# Patient Record
Sex: Female | Born: 1985
Health system: Southern US, Community
[De-identification: ages and names within clinical notes are randomized; demographics above are authoritative.]

## PROBLEM LIST (undated history)

## (undated) ENCOUNTER — Inpatient Hospital Stay (HOSPITAL_COMMUNITY): Payer: Self-pay

## (undated) ENCOUNTER — Ambulatory Visit
Admission: EM | Disposition: A | Discharge status: Home / Self Care | Payer: No Typology Code available for payment source

## (undated) DIAGNOSIS — L930 Discoid lupus erythematosus: Secondary | ICD-10-CM

## (undated) DIAGNOSIS — Z3490 Encounter for supervision of normal pregnancy, unspecified, unspecified trimester: Secondary | ICD-10-CM

## (undated) DIAGNOSIS — B009 Herpesviral infection, unspecified: Secondary | ICD-10-CM

## (undated) DIAGNOSIS — R87619 Unspecified abnormal cytological findings in specimens from cervix uteri: Secondary | ICD-10-CM

## (undated) DIAGNOSIS — E538 Deficiency of other specified B group vitamins: Secondary | ICD-10-CM

## (undated) DIAGNOSIS — Z8619 Personal history of other infectious and parasitic diseases: Secondary | ICD-10-CM

## (undated) HISTORY — DX: Herpesviral infection, unspecified: B00.9

## (undated) HISTORY — DX: Encounter for supervision of normal pregnancy, unspecified, unspecified trimester: Z34.90

## (undated) HISTORY — PX: WISDOM TOOTH EXTRACTION: SHX21

## (undated) HISTORY — DX: Unspecified abnormal cytological findings in specimens from cervix uteri: R87.619

## (undated) HISTORY — DX: Deficiency of other specified B group vitamins: E53.8

## (undated) HISTORY — DX: Personal history of other infectious and parasitic diseases: Z86.19

## (undated) HISTORY — PX: COLPOSCOPY: SHX161

## (undated) HISTORY — DX: Discoid lupus erythematosus: L93.0

---

## 2004-01-14 ENCOUNTER — Emergency Department (HOSPITAL_COMMUNITY): Admission: EM | Admit: 2004-01-14 | Discharge: 2004-01-15 | Payer: Self-pay | Admitting: *Deleted

## 2004-07-05 ENCOUNTER — Emergency Department (HOSPITAL_COMMUNITY): Admission: EM | Admit: 2004-07-05 | Discharge: 2004-07-05 | Payer: Self-pay | Admitting: Emergency Medicine

## 2004-07-21 ENCOUNTER — Emergency Department (HOSPITAL_COMMUNITY): Admission: EM | Admit: 2004-07-21 | Discharge: 2004-07-21 | Payer: Self-pay | Admitting: Emergency Medicine

## 2004-09-28 ENCOUNTER — Emergency Department (HOSPITAL_COMMUNITY): Admission: EM | Admit: 2004-09-28 | Discharge: 2004-09-28 | Payer: Self-pay | Admitting: Emergency Medicine

## 2004-10-15 ENCOUNTER — Emergency Department (HOSPITAL_COMMUNITY): Admission: EM | Admit: 2004-10-15 | Discharge: 2004-10-15 | Payer: Self-pay | Admitting: Emergency Medicine

## 2004-11-01 ENCOUNTER — Ambulatory Visit (HOSPITAL_COMMUNITY): Admission: AD | Admit: 2004-11-01 | Discharge: 2004-11-01 | Payer: Self-pay | Admitting: Obstetrics and Gynecology

## 2004-11-22 ENCOUNTER — Observation Stay (HOSPITAL_COMMUNITY): Admission: AD | Admit: 2004-11-22 | Discharge: 2004-11-22 | Payer: Self-pay | Admitting: Obstetrics and Gynecology

## 2005-01-08 ENCOUNTER — Inpatient Hospital Stay (HOSPITAL_COMMUNITY): Admission: AD | Admit: 2005-01-08 | Discharge: 2005-01-11 | Payer: Self-pay | Admitting: Obstetrics and Gynecology

## 2005-03-25 ENCOUNTER — Emergency Department (HOSPITAL_COMMUNITY): Admission: EM | Admit: 2005-03-25 | Discharge: 2005-03-25 | Payer: Self-pay | Admitting: Emergency Medicine

## 2007-05-26 ENCOUNTER — Other Ambulatory Visit: Admission: RE | Admit: 2007-05-26 | Discharge: 2007-05-26 | Payer: Self-pay | Admitting: Obstetrics and Gynecology

## 2007-09-09 ENCOUNTER — Ambulatory Visit (HOSPITAL_COMMUNITY): Admission: RE | Admit: 2007-09-09 | Discharge: 2007-09-09 | Payer: Self-pay | Admitting: Family Medicine

## 2007-11-23 ENCOUNTER — Other Ambulatory Visit: Admission: RE | Admit: 2007-11-23 | Discharge: 2007-11-23 | Payer: Self-pay | Admitting: Obstetrics and Gynecology

## 2008-06-14 ENCOUNTER — Other Ambulatory Visit: Admission: RE | Admit: 2008-06-14 | Discharge: 2008-06-14 | Payer: Self-pay | Admitting: Obstetrics and Gynecology

## 2009-07-13 ENCOUNTER — Other Ambulatory Visit: Admission: RE | Admit: 2009-07-13 | Discharge: 2009-07-13 | Payer: Self-pay | Admitting: Obstetrics and Gynecology

## 2010-07-19 ENCOUNTER — Other Ambulatory Visit (HOSPITAL_COMMUNITY)
Admission: RE | Admit: 2010-07-19 | Discharge: 2010-07-19 | Disposition: A | Discharge status: Home / Self Care | Payer: Commercial Indemnity | Source: Ambulatory Visit | Attending: Obstetrics and Gynecology | Admitting: Obstetrics and Gynecology

## 2010-07-19 ENCOUNTER — Other Ambulatory Visit: Payer: Self-pay | Admitting: Adult Health

## 2010-07-19 DIAGNOSIS — Z01419 Encounter for gynecological examination (general) (routine) without abnormal findings: Secondary | ICD-10-CM | POA: Insufficient documentation

## 2010-07-19 DIAGNOSIS — Z113 Encounter for screening for infections with a predominantly sexual mode of transmission: Secondary | ICD-10-CM | POA: Insufficient documentation

## 2010-11-02 NOTE — H&P (Signed)
NAMECAYLOR, Stefanie [name] NO.:  [id number]   MEDICAL RECORD NO.:  [id number]          PATIENT TYPE:  INP   LOCATION:  LDR1                          FACILITY:  [facility]   PHYSICIAN:  Tilda Burrow, M.D. DATE OF BIRTH:  09/13/1985   DATE OF ADMISSION:  01/08/2005  DATE OF DISCHARGE:  LH                                HISTORY & PHYSICAL   REASON FOR ADMISSION:  Pregnancy at 22 and 3.  Induction at request of  patient due to maternal discomfort and fatigue and post dates.   MEDICAL HISTORY:  Negative.   SURGICAL HISTORY:  Negative.   ALLERGIES:  She has no known allergies.   MEDICATIONS:  Prenatal vitamins.   SOCIAL HISTORY:  She is single.  Her family is present and supportive.   PRENATAL COURSE:  Essentially uneventful.  She did have an abnormal Pap.  Blood type is A positive.  GBS is negative.  Rubella is immune.  Hepatitis B  surface antigen negative.  HIV is negative.  HPV is positive.  Serology is  nonreactive.  GC and Chlamydia initially were positive.  Tests of cure were  negative and repeated at 36 weeks and were negative.  AFP was abnormal, but  there normal high resolution ultrasound.  GBS was negative at 28 weeks.  At  28 weeks, hemoglobin was 12.4; at 28 weeks, hematocrit was 35.8.  One hour  glucose was 95.  _msafp____ screen was negative.   PHYSICAL EXAMINATION:  VITAL SIGNS:  Weight was 186, blood pressure 112/62,  fundal height 35 cm, fetal heart rate 140s, strong and regular.   PLAN:  We are going to admit for Foley bulb induction of labor.      DL/MEDQ  D:  16/03/9603  T:  01/08/2005  Job:  [id number]

## 2010-11-02 NOTE — Op Note (Signed)
NAMEMURRY, KHIEV NO.:  [id number]   MEDICAL RECORD NO.:  [id number]          PATIENT TYPE:  INP   LOCATION:  A402                          FACILITY:  [facility]   PHYSICIAN:  Tilda Burrow, M.D. DATE OF BIRTH:  Oct 30, 1985   DATE OF PROCEDURE:  01/09/2005  DATE OF DISCHARGE:                                 OPERATIVE REPORT   PROCEDURE:  Epidural catheter placement at 9 a.m.   Continuous lumbar epidural catheter placed at approximately 9 a.m. using  loss-of-resistance technique. The patient signed consent. Requested  epidural. Received fluid bolus. Had continuous lumbar epidural catheter  placed using loss-of-resistance technique with easy introduction of 5 cc of  1.5% lidocaine with epinephrine following by insertion of the epidural  catheter 3 cm into the epidural space, removal of the Tuohy needle and  taping the catheter to the back where upon a continuous infusion of 12 cc  per hour was administered with a 9 cc fluid bolus. The patient tolerated the  procedure well with good symmetric analgesic effect.       JVF/MEDQ  D:  01/11/2005  T:  01/11/2005  Job:  [id number]

## 2010-11-02 NOTE — Op Note (Signed)
NAMEREYES, FIFIELD NO.:  [id number]   MEDICAL RECORD NO.:  [id number]          PATIENT TYPE:  INP   LOCATION:  LDR1                          FACILITY:  [facility]   PHYSICIAN:  Tilda Burrow, M.D. DATE OF BIRTH:  12-04-1985   DATE OF PROCEDURE:  DATE OF DISCHARGE:                                 OPERATIVE REPORT   ONSET OF LABOR:  01/09/2005 at 8 a.m.   LENGTH OF FIRST STAGE LABOR:  2 hours and 14 minutes.   LENGTH OF SECOND STAGE LABOR:  17 minutes.   LENGTH OF THIRD STAGE LABOR:  9 minutes.   DATE OF DELIVERY:  01/09/2006 at 10:36 a.m.   DELIVERY NOTE:  Kurt had a normal spontaneous vaginal delivery of a  viable female infant over an intact perineum.  Upon delivery of head,  shoulders spontaneously delivered.  Upon delivery, the infant had a strong  cry, good movement of all extremities and pinked up well.  The infant was  thoroughly suctioned and dried, cord clamped and cut by mother and passed  off to the nursery staff for newborn care.  Upon inspection, the perineum  was noted to be intact.  The first stage of labor was actively managed with  20 units of Pitocin, 1000 mL D5 LR at a rapid rate.  The placenta was  delivered spontaneously via Schultze mechanism.  Upon inspection, membranes  are noted to be intact.  There was a three-vessel cord noted.  Estimated  blood loss approximately 300 mL.  Epidural catheter was removed with blue  tip intact.  The infant and mother both stabilized and transferred up to the  postpartum unit in stable condition.       DL/MEDQ  D:  16/03/9603  T:  01/09/2005  Job:  [id number]

## 2010-11-02 NOTE — Consult Note (Signed)
NAME:  Stefanie [name], Stefanie [name] NO.:  [id number]   MEDICAL RECORD NO.:  [id number]          PATIENT TYPE:  OIB   LOCATION:  LDR3                          FACILITY:  [facility]   PHYSICIAN:  Tilda Burrow, M.D. DATE OF BIRTH:  [birth year]   DATE OF CONSULTATION:  DATE OF DISCHARGE:                                   CONSULTATION   HISTORY OF PRESENT ILLNESS:  Stefanie [name] came in with complaints of a headache  and some right upper quadrant pain.  She has a reactive non-stress test.  Upon examination, her pain really does appear to be intercostal, however, I  will do labs to rule out organ origin.  She is getting a cold, feels like  her headache may be sinus.  Her blood pressure is 100/60.  Assuming her labs  are okay, I am going to discharge her home on some Zyrtec and some Lortab.       FC/MEDQ  D:  11/22/2004  T:  11/22/2004  Job:  [id number]   cc:   [facility]

## 2011-07-23 ENCOUNTER — Other Ambulatory Visit: Payer: Self-pay | Admitting: Adult Health

## 2011-07-23 ENCOUNTER — Other Ambulatory Visit (HOSPITAL_COMMUNITY)
Admission: RE | Admit: 2011-07-23 | Discharge: 2011-07-23 | Disposition: A | Discharge status: Home / Self Care | Payer: Commercial Indemnity | Source: Ambulatory Visit | Attending: Obstetrics and Gynecology | Admitting: Obstetrics and Gynecology

## 2011-07-23 DIAGNOSIS — Z01419 Encounter for gynecological examination (general) (routine) without abnormal findings: Secondary | ICD-10-CM | POA: Insufficient documentation

## 2011-07-23 DIAGNOSIS — Z113 Encounter for screening for infections with a predominantly sexual mode of transmission: Secondary | ICD-10-CM | POA: Insufficient documentation

## 2012-08-04 ENCOUNTER — Other Ambulatory Visit: Payer: Self-pay | Admitting: Adult Health

## 2012-08-04 ENCOUNTER — Other Ambulatory Visit (HOSPITAL_COMMUNITY)
Admission: RE | Admit: 2012-08-04 | Discharge: 2012-08-04 | Disposition: A | Discharge status: Home / Self Care | Payer: Commercial Indemnity | Source: Ambulatory Visit | Attending: Obstetrics and Gynecology | Admitting: Obstetrics and Gynecology

## 2012-08-04 DIAGNOSIS — Z01419 Encounter for gynecological examination (general) (routine) without abnormal findings: Secondary | ICD-10-CM | POA: Insufficient documentation

## 2012-08-04 DIAGNOSIS — Z113 Encounter for screening for infections with a predominantly sexual mode of transmission: Secondary | ICD-10-CM | POA: Insufficient documentation

## 2013-03-24 ENCOUNTER — Other Ambulatory Visit: Payer: Self-pay | Admitting: Adult Health

## 2013-04-29 ENCOUNTER — Other Ambulatory Visit: Payer: Self-pay | Admitting: Adult Health

## 2013-10-12 ENCOUNTER — Ambulatory Visit (INDEPENDENT_AMBULATORY_CARE_PROVIDER_SITE_OTHER): Payer: Managed Care, Other (non HMO) | Admitting: Adult Health

## 2013-10-12 ENCOUNTER — Encounter: Payer: Self-pay | Admitting: Adult Health

## 2013-10-12 VITALS — BP 112/70 | HR 72 | Ht 69.0 in | Wt 164.0 lb

## 2013-10-12 DIAGNOSIS — Z01419 Encounter for gynecological examination (general) (routine) without abnormal findings: Secondary | ICD-10-CM

## 2013-10-12 DIAGNOSIS — B009 Herpesviral infection, unspecified: Secondary | ICD-10-CM | POA: Insufficient documentation

## 2013-10-12 HISTORY — DX: Herpesviral infection, unspecified: B00.9

## 2013-10-12 MED ORDER — VALACYCLOVIR HCL 1 G PO TABS: ORAL_TABLET | ORAL | Status: DC

## 2013-10-12 NOTE — Progress Notes (Signed)
Patient ID: Stefanie [name], female   DOB: [birth year], 28 y.o.   MRN: [id number] History of Present Illness: Stefanie [name] is 28 year old white female, engaged, in for a physical.She had normal pap 08/04/12.She is getting married in June.   Current Medications, Allergies, Past Medical History, Past Surgical History, Family History and Social History were reviewed in [facility] electronic medical record.     Review of Systems: Patient denies any headaches, blurred vision, shortness of breath, chest pain, abdominal pain, problems with bowel movements, urination, or intercourse. No joint pain or mood swings, needs refill on valtrex.    Physical Exam:BP 112/70  Pulse 72  Ht 5\' 9"  (1.753 m)  Wt 164 lb (74.39 kg)  BMI 24.21 kg/m2  LMP 10/05/2013 General:  Well developed, well nourished, no acute distress Skin:  Warm and dry Neck:  Midline trachea, normal thyroid Lungs; Clear to auscultation bilaterally Breast:  No dominant palpable mass, retraction, or nipple discharge Cardiovascular: Regular rate and rhythm Abdomen:  Soft, non tender, no hepatosplenomegaly Pelvic:  External genitalia is normal in appearance.  The vagina is normal in appearance. The cervix is bulbous.  Uterus is felt to be normal size, shape, and contour.  No adnexal masses or tenderness noted. Extremities:  No swelling or varicosities noted Psych:  No mood changes, alert and cooperative, seems happy   Impression: Yearly gyn exam no pap Herpes   Plan: Refilled valtrex x 1 year Physical and pap in 1 year

## 2013-10-12 NOTE — Patient Instructions (Signed)
Physical  And pap in 1 year Call prn

## 2014-01-12 ENCOUNTER — Ambulatory Visit (INDEPENDENT_AMBULATORY_CARE_PROVIDER_SITE_OTHER): Payer: Managed Care, Other (non HMO) | Admitting: Obstetrics & Gynecology

## 2014-01-12 ENCOUNTER — Encounter: Payer: Self-pay | Admitting: Obstetrics & Gynecology

## 2014-01-12 VITALS — BP 110/80 | Ht 69.2 in | Wt 173.0 lb

## 2014-01-12 DIAGNOSIS — B373 Candidiasis of vulva and vagina: Secondary | ICD-10-CM

## 2014-01-12 DIAGNOSIS — B3731 Acute candidiasis of vulva and vagina: Secondary | ICD-10-CM | POA: Insufficient documentation

## 2014-01-12 MED ORDER — TERCONAZOLE 0.4 % VA CREA: 1.0000 | TOPICAL_CREAM | Freq: Every day | VAGINAL | Status: DC

## 2014-01-12 NOTE — Progress Notes (Signed)
Patient ID: Stefanie [name], female   DOB: 07/25/1985, 28 y.o.   MRN: [id number] Started having discharge yesterday, itching severely  Exam Severe yeast infection  Treated with GV  rx  Terazol

## 2014-01-16 ENCOUNTER — Emergency Department (HOSPITAL_COMMUNITY)
Admission: EM | Admit: 2014-01-16 | Discharge: 2014-01-16 | Disposition: A | Discharge status: Home / Self Care | Payer: Commercial Indemnity | Attending: Emergency Medicine | Admitting: Emergency Medicine

## 2014-01-16 ENCOUNTER — Emergency Department (HOSPITAL_COMMUNITY): Payer: Commercial Indemnity

## 2014-01-16 ENCOUNTER — Encounter (HOSPITAL_COMMUNITY): Payer: Self-pay | Admitting: Emergency Medicine

## 2014-01-16 DIAGNOSIS — S99929A Unspecified injury of unspecified foot, initial encounter: Secondary | ICD-10-CM

## 2014-01-16 DIAGNOSIS — Z8619 Personal history of other infectious and parasitic diseases: Secondary | ICD-10-CM | POA: Insufficient documentation

## 2014-01-16 DIAGNOSIS — S8990XA Unspecified injury of unspecified lower leg, initial encounter: Secondary | ICD-10-CM | POA: Insufficient documentation

## 2014-01-16 DIAGNOSIS — S99919A Unspecified injury of unspecified ankle, initial encounter: Secondary | ICD-10-CM

## 2014-01-16 DIAGNOSIS — S92919A Unspecified fracture of unspecified toe(s), initial encounter for closed fracture: Secondary | ICD-10-CM | POA: Insufficient documentation

## 2014-01-16 DIAGNOSIS — IMO0002 Reserved for concepts with insufficient information to code with codable children: Secondary | ICD-10-CM | POA: Insufficient documentation

## 2014-01-16 DIAGNOSIS — Y9389 Activity, other specified: Secondary | ICD-10-CM | POA: Insufficient documentation

## 2014-01-16 DIAGNOSIS — S92912A Unspecified fracture of left toe(s), initial encounter for closed fracture: Secondary | ICD-10-CM

## 2014-01-16 DIAGNOSIS — F17200 Nicotine dependence, unspecified, uncomplicated: Secondary | ICD-10-CM | POA: Insufficient documentation

## 2014-01-16 DIAGNOSIS — Y929 Unspecified place or not applicable: Secondary | ICD-10-CM | POA: Insufficient documentation

## 2014-01-16 MED ORDER — TRAMADOL HCL 50 MG PO TABS: 50.0000 mg | ORAL_TABLET | Freq: Four times a day (QID) | ORAL | Status: DC | PRN

## 2014-01-16 NOTE — ED Provider Notes (Signed)
CSN: [id number]     Arrival date & time 01/16/14  0950 History   First MD Initiated Contact with Patient 01/16/14 1017     Chief Complaint  Patient presents with  . Foot Pain   Stefanie [name] is a 28 y.o. female who presents to the Emergency Department complaining of pain to her left fourth and fifth toes that began suddenly after a direct blow to her foot this morning.  She states she struck her toes on the edge of a door.  She has not tried any therapies or medications at home prior to ED arrival.  She denies radiating pain, open wounds to the foot, numbness or ankle pain.   (Consider location/radiation/quality/duration/timing/severity/associated sxs/prior Treatment) Patient is a 28 y.o. female presenting with lower extremity pain. The history is provided by the patient.  Foot Pain This is a new problem. The current episode started today. The problem occurs constantly. The problem has been unchanged. Associated symptoms include arthralgias. Pertinent negatives include no chills, fever, joint swelling, nausea, numbness, rash or weakness. The symptoms are aggravated by bending (weight bearing). She has tried nothing for the symptoms. The treatment provided no relief.    Past Medical History  Diagnosis Date  . Abnormal Pap smear of cervix   . HSV-2 (herpes simplex virus 2) infection   . Hx of gonorrhea   . Herpes 10/12/2013   Past Surgical History  Procedure Laterality Date  . Colposcopy      colpo with biopsy   Family History  Problem Relation Age of Onset  . Heart attack Father   . Heart disease Father   . Diabetes Maternal Uncle   . Dementia Maternal Grandmother   . Cancer Maternal Grandfather     throat, lung, bladder  . Diabetes Paternal Grandmother   . Heart attack Paternal Grandfather   . Heart disease Paternal Grandfather    History  Substance Use Topics  . Smoking status: Current Every Day Smoker -- 0.25 packs/day for 10 years    Types: Cigarettes  . Smokeless  tobacco: Never Used  . Alcohol Use: No   OB History   Grav Para Term Preterm Abortions TAB SAB Ect Mult Living   2 1   1  1   1      Review of Systems  Constitutional: Negative for fever and chills.  Gastrointestinal: Negative for nausea.  Genitourinary: Negative for dysuria and difficulty urinating.  Musculoskeletal: Positive for arthralgias. Negative for joint swelling.       Left foot pain  Skin: Negative for color change, rash and wound.  Neurological: Negative for weakness and numbness.  All other systems reviewed and are negative.     Allergies  Review of patient's allergies indicates no known allergies.  Home Medications   Prior to Admission medications   Medication Sig Start Date End Date Taking? Authorizing Provider  Probiotic Product (PROBIOTIC DAILY PO) Take 1 tablet by mouth daily.    Historical Provider, MD  terconazole (TERAZOL 7) 0.4 % vaginal cream Place 1 applicator vaginally at bedtime. 01/12/14   Lazaro Arms, MD  valACYclovir (VALTREX) 1000 MG tablet TAKE ONE TABLET BY MOUTH EVERY DAY 10/12/13   Adline Potter, NP   BP 107/74  Pulse 76  Temp(Src) 98.2 F (36.8 C) (Oral)  Resp 16  Ht 5\' 9"  (1.753 m)  Wt 170 lb 6 oz (77.282 kg)  BMI 25.15 kg/m2  SpO2 98%  LMP 12/22/2013 Physical Exam  Nursing note and vitals reviewed.  Constitutional: She is oriented to person, place, and time. She appears well-developed and well-nourished. No distress.  HENT:  Head: Normocephalic and atraumatic.  Cardiovascular: Normal rate, regular rhythm, normal heart sounds and intact distal pulses.   No murmur heard. Pulmonary/Chest: Effort normal and breath sounds normal. No respiratory distress.  Musculoskeletal: She exhibits tenderness. She exhibits no edema.  Localized ttp of the proximal fourth and fifth toes of the left foot.  Mild bruising present at the fifth toe.  No bony deformity or edema.  No tenderness proximally.   Neurological: She is alert and oriented to  person, place, and time. She exhibits normal muscle tone. Coordination normal.  Skin: Skin is warm and dry.    ED Course  Procedures (including critical care time) Labs Review Labs Reviewed - No data to display  Imaging Review Dg Foot Complete Left  01/16/2014   CLINICAL DATA:  Lateral foot pain  EXAM: LEFT FOOT - COMPLETE 3+ VIEW  COMPARISON:  None.  FINDINGS: Acute obliquely oriented fracture through the proximal fifth phalanx. Associated surrounding soft tissue swelling. The remainder the bones and joints are unremarkable.  IMPRESSION: Acute minimally displaced fracture through the fifth proximal phalanx.   Electronically Signed   By: Malachy MoanHeath  McCullough M.D.   On: 01/16/2014 11:36     EKG Interpretation None      MDM   Final diagnoses:  Fracture, toe, left, closed, initial encounter    No open fx, no bony deformity.  Pain improved after buddy tape and post op shoe.  Remains NV intact.  Referral for ortho given.  rx for ultram.  Pt stable for d/c    Sevan Mcbroom L. Trisha Mangleriplett, PA-C 01/17/14 2114

## 2014-01-16 NOTE — ED Notes (Addendum)
Pt hit left pinkie toe and left 4th toe against door jam this am ,

## 2014-01-16 NOTE — Discharge Instructions (Signed)
Buddy Taping of Toes °We have taped your toes together to keep them from moving. This is called "buddy taping" since we used a part of your own body to keep the injured part still. We placed soft padding between your toes to keep them from rubbing against each other. Buddy taping will help with healing and to reduce pain. Keep your toes buddy taped together for as long as directed by your caregiver. °HOME CARE INSTRUCTIONS  °· Raise your injured area above the level of your heart while sitting or lying down. Prop it up with pillows. °· An ice pack used every twenty minutes, while awake, for the first one to two days may be helpful. Put ice in a plastic bag and put a towel between the bag and your skin. °· Watch for signs that the taping is too tight. These signs may be: °¨ Numbness of your taped toes. °¨ Coolness of your taped toes. °¨ Color change in the area beyond the tape. °¨ Increased pain. °· If you have any of these signs, loosen or rewrap the tape. If you need to loosen or rewrap the buddy tape, make sure you use the padding again. °SEEK IMMEDIATE MEDICAL CARE IF:  °· You have worse pain, swelling, inflammation (soreness), drainage or bleeding after you rewrap the tape. °· Any new problems occur. °MAKE SURE YOU:  °· Understand these instructions. °· Will watch your condition. °· Will get help right away if you are not doing well or get worse. °Document Released: 03/07/2004 Document Revised: 08/26/2011 Document Reviewed: 05/31/2008 °ExitCare® Patient Information ©2015 [employer]. This information is not intended to replace advice given to you by your health care provider. Make sure you discuss any questions you have with your health care provider. ° °

## 2014-01-17 ENCOUNTER — Telehealth: Payer: Self-pay | Admitting: *Deleted

## 2014-01-17 NOTE — Telephone Encounter (Signed)
Pt states saw Dr. Despina HiddenEure last Wednesday, using cream Terazol he prescribed now having a yellowish discharge but itching has improved.  Pt states thinks she might have an infection. Please advise.

## 2014-01-17 NOTE — ED Provider Notes (Signed)
Medical screening examination/treatment/procedure(s) were performed by non-physician practitioner and as supervising physician I was immediately available for consultation/collaboration.   EKG Interpretation None        Dally Oshel M Denzell Colasanti, DO 01/17/14 2155 

## 2014-01-18 NOTE — Telephone Encounter (Signed)
Pt informed to continue current therapy. Pt verbalized understanding.

## 2014-01-18 NOTE — Telephone Encounter (Signed)
Just tell her to stick to the plan, does not have BV at the same time, not possible from a pH standpoint

## 2014-01-20 ENCOUNTER — Ambulatory Visit (INDEPENDENT_AMBULATORY_CARE_PROVIDER_SITE_OTHER): Payer: Managed Care, Other (non HMO) | Admitting: Adult Health

## 2014-01-20 DIAGNOSIS — Z3200 Encounter for pregnancy test, result unknown: Secondary | ICD-10-CM

## 2014-01-20 DIAGNOSIS — Z3201 Encounter for pregnancy test, result positive: Secondary | ICD-10-CM

## 2014-01-20 NOTE — Progress Notes (Signed)
Patient ID: Stefanie [name], female   DOB: [birth year], 28 y.o.   MRN: [id number] Pt here today for UPT, UPT is positive. Pt states her LMP was 12/22/13. Pt states that she felt like she had a fever this morning but it was 98.5 here today. Pt was advised to take 2 flinstones a day and call us back if there are any changes. Pt was given verification of pregnancy.

## 2014-02-04 ENCOUNTER — Encounter: Payer: Self-pay | Admitting: Obstetrics and Gynecology

## 2014-02-04 ENCOUNTER — Ambulatory Visit (INDEPENDENT_AMBULATORY_CARE_PROVIDER_SITE_OTHER): Payer: Managed Care, Other (non HMO) | Admitting: Obstetrics and Gynecology

## 2014-02-04 VITALS — BP 120/80 | Ht 69.0 in | Wt 173.0 lb

## 2014-02-04 DIAGNOSIS — N9089 Other specified noninflammatory disorders of vulva and perineum: Secondary | ICD-10-CM

## 2014-02-04 DIAGNOSIS — Z331 Pregnant state, incidental: Secondary | ICD-10-CM

## 2014-02-04 MED ORDER — NYSTATIN-TRIAMCINOLONE 100000-0.1 UNIT/GM-% EX OINT: 1.0000 "application " | TOPICAL_OINTMENT | Freq: Two times a day (BID) | CUTANEOUS | Status: DC

## 2014-02-04 NOTE — Patient Instructions (Signed)
Dry vulvar regime reviewed.

## 2014-02-04 NOTE — Progress Notes (Signed)
   [facility] Visit  Patient name: Stefanie [name] MRN [id number]  Date of birth: May 28, 1986  CC & HPI:  Stefanie [name] is a 28 y.o. female presenting today for vaginal irritation, vaginal discharge. She states that she is having pain on her right side and having stomach cramps. Pt denies any bleeding. Pt states that she also has some vaginal irritation and maybe some discharge. She states that she noticed all of this since yesterday. She states that she works 9 AM-7:30 PM everyday. She states that she was here on 7/29 for a yeast infection and Dr. Despina HiddenEure treated her for that. She states that she is [redacted] weeks pregnant. She states that her last pap smear was last year.   ROS:  +Nausea +Stomach cramping +Vaginal irritation +Vaginal discharge No other complaints.   Pertinent History Reviewed:   Reviewed: Significant for  Medical         Past Medical History  Diagnosis Date  . Abnormal Pap smear of cervix   . HSV-2 (herpes simplex virus 2) infection   . Hx of gonorrhea   . Herpes 10/12/2013                              Surgical Hx:    Past Surgical History  Procedure Laterality Date  . Colposcopy      colpo with biopsy   Medications: Reviewed & Updated - see associated section                      Current outpatient prescriptions:Prenatal Vit-Fe Fumarate-FA (MULTIVITAMIN-PRENATAL) 27-0.8 MG TABS tablet, Take 1 tablet by mouth daily at 12 noon., Disp: , Rfl: ;  Probiotic Product (PROBIOTIC DAILY PO), Take 1 tablet by mouth daily., Disp: , Rfl: ;  traMADol (ULTRAM) 50 MG tablet, Take 1 tablet (50 mg total) by mouth every 6 (six) hours as needed., Disp: 15 tablet, Rfl: 0 valACYclovir (VALTREX) 1000 MG tablet, Take 1,000 mg by mouth daily., Disp: , Rfl:    Social History: Reviewed -  reports that she has been smoking Cigarettes.  She has a 2.5 pack-year smoking history. She has never used smokeless tobacco.  Objective Findings:  Vitals: Blood pressure 120/80, height 5\' 9"   (1.753 m), weight 173 lb (78.472 kg), last menstrual period 12/22/2013.  Physical Examination: General appearance - alert, well appearing, and in no distress, oriented to person, place, and time and normal appearing weight Pelvic - normal external genitalia, slight erythema of labia majora bilaterally, vulva, vagina, cervix, uterus and adnexa, VULVA: normal appearing vulva with no masses, tenderness or lesions,  VAGINA: normal appearing vagina with normal color and discharge, no lesions, vaginal discharge - white and scant, WET MOUNT done - results: KOH done, clue cells, negative wet mount and negative for trichomoniasis, DNA probe for chlamydia and GC obtained,  CERVIX: normal appearing cervix without discharge or lesions,  UTERUS: uterus is normal size, shape, consistency and nontender,  ADNEXA: normal adnexa in size, nontender and no masses  Assessment & Plan:   A:  1. No visible yeast.  2. Persistent vulvar irritation status post yeast dx.    P:  1. Treat with a topical Mycolog      This chart was scribed by Chestine SporeSoijett Blue, Medical Scribe, for Dr. Christin BachJohn Ferguson on 02/04/14 at 11:38 AM. This chart was reviewed by Dr. Christin BachJohn Ferguson for accuracy.

## 2014-02-14 ENCOUNTER — Other Ambulatory Visit: Payer: Self-pay | Admitting: Obstetrics & Gynecology

## 2014-02-14 ENCOUNTER — Other Ambulatory Visit: Payer: Managed Care, Other (non HMO)

## 2014-02-14 DIAGNOSIS — O3680X0 Pregnancy with inconclusive fetal viability, not applicable or unspecified: Secondary | ICD-10-CM

## 2014-02-15 ENCOUNTER — Ambulatory Visit (INDEPENDENT_AMBULATORY_CARE_PROVIDER_SITE_OTHER): Payer: Managed Care, Other (non HMO)

## 2014-02-15 ENCOUNTER — Encounter: Payer: Managed Care, Other (non HMO) | Admitting: Women's Health

## 2014-02-15 DIAGNOSIS — O3680X0 Pregnancy with inconclusive fetal viability, not applicable or unspecified: Secondary | ICD-10-CM

## 2014-02-15 NOTE — Progress Notes (Signed)
U/S(7+6wks)-single IUP with +FCA noted, FHR-167 bpm, CRL c/w LMP dates, cx appears closed, bilateral adnexa appears WNL with C.L. Noted on LT = 2.9cm, no free fluid noted within the pelvis

## 2014-02-23 ENCOUNTER — Encounter: Payer: Self-pay | Admitting: Advanced Practice Midwife

## 2014-02-23 ENCOUNTER — Ambulatory Visit (INDEPENDENT_AMBULATORY_CARE_PROVIDER_SITE_OTHER): Payer: Managed Care, Other (non HMO) | Admitting: Advanced Practice Midwife

## 2014-02-23 VITALS — BP 102/62 | Wt 175.0 lb

## 2014-02-23 DIAGNOSIS — Z1389 Encounter for screening for other disorder: Secondary | ICD-10-CM

## 2014-02-23 DIAGNOSIS — Z3480 Encounter for supervision of other normal pregnancy, unspecified trimester: Secondary | ICD-10-CM

## 2014-02-23 DIAGNOSIS — Z3490 Encounter for supervision of normal pregnancy, unspecified, unspecified trimester: Secondary | ICD-10-CM

## 2014-02-23 DIAGNOSIS — Z0184 Encounter for antibody response examination: Secondary | ICD-10-CM

## 2014-02-23 DIAGNOSIS — Z3481 Encounter for supervision of other normal pregnancy, first trimester: Secondary | ICD-10-CM

## 2014-02-23 DIAGNOSIS — Z0189 Encounter for other specified special examinations: Secondary | ICD-10-CM

## 2014-02-23 DIAGNOSIS — Z1159 Encounter for screening for other viral diseases: Secondary | ICD-10-CM

## 2014-02-23 DIAGNOSIS — Z130 Encounter for screening for diseases of the blood and blood-forming organs and certain disorders involving the immune mechanism: Secondary | ICD-10-CM

## 2014-02-23 DIAGNOSIS — Z331 Pregnant state, incidental: Secondary | ICD-10-CM

## 2014-02-23 LAB — POCT URINALYSIS DIPSTICK
Blood, UA: NEGATIVE
Glucose, UA: NEGATIVE
KETONES UA: NEGATIVE
Leukocytes, UA: NEGATIVE
Nitrite, UA: NEGATIVE
Protein, UA: NEGATIVE

## 2014-02-23 LAB — CBC
HCT: 39 % (ref 36.0–46.0)
Hemoglobin: 13.6 g/dL (ref 12.0–15.0)
MCH: 32.9 pg (ref 26.0–34.0)
MCHC: 34.9 g/dL (ref 30.0–36.0)
MCV: 94.4 fL (ref 78.0–100.0)
Platelets: 214 10*3/uL (ref 150–400)
RBC: 4.13 MIL/uL (ref 3.87–5.11)
RDW: 12.6 % (ref 11.5–15.5)
WBC: 5.1 10*3/uL (ref 4.0–10.5)

## 2014-02-23 MED ORDER — DOXYLAMINE-PYRIDOXINE 10-10 MG PO TBEC: DELAYED_RELEASE_TABLET | ORAL | Status: DC

## 2014-02-23 NOTE — Patient Instructions (Signed)
Ask insurance company about : Nuchal translucency/integrated screening (2 part test) versus Harmony (cell free DNA)

## 2014-02-23 NOTE — Progress Notes (Signed)
  Subjective:    Stefanie [name] is a [id number] [redacted]w[redacted]d being seen today for her first obstetrical visit.  Her obstetrical history is significant for SVD without problems 9 years ago..  Pregnancy history fully reviewed.  Patient reports fatigue and nausea.  Filed Vitals:   02/23/14 0913  BP: 102/62  Weight: 175 lb (79.379 kg)    HISTORY: OB History  Gravida Para Term Preterm AB SAB TAB Ectopic Multiple Living  # Outcome Date GA Lbr Len/2nd Weight Sex Delivery Anes PTL Lv  3 CUR           2 TRM 2006    F SVD   Y  1 SAB              Past Medical History  Diagnosis Date  . Abnormal Pap smear of cervix   . HSV-2 (herpes simplex virus 2) infection   . Hx of gonorrhea   . Herpes 10/12/2013   Past Surgical History  Procedure Laterality Date  . Colposcopy      colpo with biopsy   Family History  Problem Relation Age of Onset  . Heart attack Father   . Heart disease Father   . Diabetes Maternal Uncle   . Dementia Maternal Grandmother   . Cancer Maternal Grandfather     throat, lung, bladder  . Diabetes Paternal Grandmother   . Heart attack Paternal Grandfather   . Heart disease Paternal Grandfather      Exam       Pelvic Exam:    Perineum: Normal Perineum   Vulva: normal                   Urinary:  urethral meatus normal    System:     Skin: normal coloration and turgor, no rashes    Neurologic: oriented, normal, normal mood   Extremities: normal strength, tone, and muscle mass   HEENT PERRLA   Mouth/Teeth mucous membranes moist, normal dentition   Neck supple and no masses   Cardiovascular: regular rate and rhythm   Respiratory:  appears well, vitals normal, no respiratory distress, acyanotic   Abdomen: soft, non-tender;  FHR: 140 [place]          Assessment:    Pregnancy: [id number] Patient Active Problem List   Diagnosis Date Noted  . Vulvar irritation 02/04/2014  . Candidiasis of vulva and vagina 01/12/2014  . Herpes 10/12/2013         Plan:     Initial labs drawn. Continue prenatal vitamins  Problem list reviewed and updated  Reviewed n/v relief measures and warning s/s to report .  Diclegis samples and rx sent to pharmacy wit coupon Reviewed recommended weight gain based on pre-gravid BMI  Encouraged well-balanced diet Genetic Screening discussed Integrated Screen: versus HARMONY (cost with insurance vs scared of a false + as with last baby) requested. Will check with insurance company to see if HARMONY may be better for her.  Right now, schedule for NT/IT  Ultrasound discussed; fetal survey: requested.  Follow up in 3 weeks for NT.IT  Low-risk ob appt .  [name],Stefanie [name] 02/23/2014

## 2014-02-24 ENCOUNTER — Encounter: Payer: Managed Care, Other (non HMO) | Admitting: Advanced Practice Midwife

## 2014-02-24 LAB — ANTIBODY SCREEN: Antibody Screen: NEGATIVE

## 2014-02-24 LAB — URINALYSIS
BILIRUBIN URINE: NEGATIVE
Glucose, UA: NEGATIVE mg/dL
Hgb urine dipstick: NEGATIVE
KETONES UR: NEGATIVE mg/dL
Leukocytes, UA: NEGATIVE
Nitrite: NEGATIVE
Protein, ur: NEGATIVE mg/dL
Specific Gravity, Urine: 1.02 (ref 1.005–1.030)
UROBILINOGEN UA: 0.2 mg/dL (ref 0.0–1.0)
pH: 7 (ref 5.0–8.0)

## 2014-02-24 LAB — ABO AND RH: Rh Type: POSITIVE

## 2014-02-24 LAB — HEPATITIS B SURFACE ANTIGEN: Hepatitis B Surface Ag: NEGATIVE

## 2014-02-24 LAB — DRUG SCREEN, URINE, NO CONFIRMATION
Amphetamine Screen, Ur: NEGATIVE
Barbiturate Quant, Ur: NEGATIVE
Benzodiazepines.: NEGATIVE
Cocaine Metabolites: NEGATIVE
Creatinine,U: 140.3 mg/dL
Marijuana Metabolite: NEGATIVE
Methadone: NEGATIVE
Opiate Screen, Urine: NEGATIVE
Phencyclidine (PCP): NEGATIVE
Propoxyphene: NEGATIVE

## 2014-02-24 LAB — GC/CHLAMYDIA PROBE AMP
CT PROBE, AMP APTIMA: NEGATIVE
GC Probe RNA: NEGATIVE

## 2014-02-24 LAB — VARICELLA ZOSTER ANTIBODY, IGG: Varicella IgG: 237.5 Index — ABNORMAL HIGH (ref <135.00)

## 2014-02-24 LAB — HIV ANTIBODY (ROUTINE TESTING W REFLEX): HIV 1&2 Ab, 4th Generation: NONREACTIVE

## 2014-02-24 LAB — RPR

## 2014-02-24 LAB — OXYCODONE SCREEN, UA, RFLX CONFIRM: Oxycodone Screen, Ur: NEGATIVE ng/mL

## 2014-02-24 LAB — SICKLE CELL SCREEN: Sickle Cell Screen: NEGATIVE

## 2014-02-24 LAB — RUBELLA SCREEN: Rubella: 0.86 Index (ref <0.90)

## 2014-02-25 LAB — URINE CULTURE
Colony Count: NO GROWTH
ORGANISM ID, BACTERIA: NO GROWTH

## 2014-03-14 ENCOUNTER — Other Ambulatory Visit: Payer: Self-pay | Admitting: Advanced Practice Midwife

## 2014-03-14 DIAGNOSIS — Z36 Encounter for antenatal screening of mother: Secondary | ICD-10-CM

## 2014-03-16 ENCOUNTER — Encounter: Payer: Managed Care, Other (non HMO) | Admitting: Women's Health

## 2014-03-16 ENCOUNTER — Other Ambulatory Visit: Payer: Managed Care, Other (non HMO)

## 2014-03-16 ENCOUNTER — Encounter: Payer: Self-pay | Admitting: Women's Health

## 2014-03-16 ENCOUNTER — Ambulatory Visit (INDEPENDENT_AMBULATORY_CARE_PROVIDER_SITE_OTHER): Payer: Managed Care, Other (non HMO) | Admitting: Women's Health

## 2014-03-16 ENCOUNTER — Ambulatory Visit (INDEPENDENT_AMBULATORY_CARE_PROVIDER_SITE_OTHER): Payer: Managed Care, Other (non HMO)

## 2014-03-16 VITALS — BP 114/70 | Wt 177.0 lb

## 2014-03-16 DIAGNOSIS — Z331 Pregnant state, incidental: Secondary | ICD-10-CM

## 2014-03-16 DIAGNOSIS — Z3480 Encounter for supervision of other normal pregnancy, unspecified trimester: Secondary | ICD-10-CM

## 2014-03-16 DIAGNOSIS — Z36 Encounter for antenatal screening of mother: Secondary | ICD-10-CM

## 2014-03-16 DIAGNOSIS — Z1389 Encounter for screening for other disorder: Secondary | ICD-10-CM

## 2014-03-16 DIAGNOSIS — Z3481 Encounter for supervision of other normal pregnancy, first trimester: Secondary | ICD-10-CM

## 2014-03-16 LAB — POCT URINALYSIS DIPSTICK
Blood, UA: NEGATIVE
Glucose, UA: NEGATIVE
Ketones, UA: NEGATIVE
Leukocytes, UA: NEGATIVE
Nitrite, UA: NEGATIVE
Protein, UA: NEGATIVE

## 2014-03-16 NOTE — Progress Notes (Signed)
Low-risk OB appointment [id number] Estimated Date of Delivery: 09/28/14 BP 114/70  Wt 177 lb (80.287 kg)  LMP 12/22/2013  BP, weight, and urine reviewed.  Refer to obstetrical flow sheet for FH & FHR.  No fm yet. Denies cramping, lof, vb, or uti s/s. No complaints. Diclegis really helping w/ nausea Reviewed today's normal NT u/s, warning s/s to report. Plan:  Continue routine obstetrical care  F/U in 4wks for OB appointment and 2nd IT 1st IT/NT today

## 2014-03-16 NOTE — Patient Instructions (Signed)
First Trimester of Pregnancy The first trimester of pregnancy is from week 1 until the end of week 12 (months 1 through 3). A week after a sperm fertilizes an egg, the egg will implant on the wall of the uterus. This embryo will begin to develop into a baby. Genes from you and your partner are forming the baby. The female genes determine whether the baby is a boy or a girl. At 6-8 weeks, the eyes and face are formed, and the heartbeat can be seen on ultrasound. At the end of 12 weeks, all the baby's organs are formed.  Now that you are pregnant, you will want to do everything you can to have a healthy baby. Two of the most important things are to get good prenatal care and to follow your health care provider's instructions. Prenatal care is all the medical care you receive before the baby's birth. This care will help prevent, find, and treat any problems during the pregnancy and childbirth. BODY CHANGES Your body goes through many changes during pregnancy. The changes vary from woman to woman.   You may gain or lose a couple of pounds at first.  You may feel sick to your stomach (nauseous) and throw up (vomit). If the vomiting is uncontrollable, call your health care provider.  You may tire easily.  You may develop headaches that can be relieved by medicines approved by your health care provider.  You may urinate more often. Painful urination may mean you have a bladder infection.  You may develop heartburn as a result of your pregnancy.  You may develop constipation because certain hormones are causing the muscles that push waste through your intestines to slow down.  You may develop hemorrhoids or swollen, bulging veins (varicose veins).  Your breasts may begin to grow larger and become tender. Your nipples may stick out more, and the tissue that surrounds them (areola) may become darker.  Your gums may bleed and may be sensitive to brushing and flossing.  Dark spots or blotches (chloasma,  mask of pregnancy) may develop on your face. This will likely fade after the baby is born.  Your menstrual periods will stop.  You may have a loss of appetite.  You may develop cravings for certain kinds of food.  You may have changes in your emotions from day to day, such as being excited to be pregnant or being concerned that something may go wrong with the pregnancy and baby.  You may have more vivid and strange dreams.  You may have changes in your hair. These can include thickening of your hair, rapid growth, and changes in texture. Some women also have hair loss during or after pregnancy, or hair that feels dry or thin. Your hair will most likely return to normal after your baby is born. WHAT TO EXPECT AT YOUR PRENATAL VISITS During a routine prenatal visit:  You will be weighed to make sure you and the baby are growing normally.  Your blood pressure will be taken.  Your abdomen will be measured to track your baby's growth.  The fetal heartbeat will be listened to starting around week 10 or 12 of your pregnancy.  Test results from any previous visits will be discussed. Your health care provider may ask you:  How you are feeling.  If you are feeling the baby move.  If you have had any abnormal symptoms, such as leaking fluid, bleeding, severe headaches, or abdominal cramping.  If you have any questions. Other tests   that may be performed during your first trimester include:  Blood tests to find your blood type and to check for the presence of any previous infections. They will also be used to check for low iron levels (anemia) and Rh antibodies. Later in the pregnancy, blood tests for diabetes will be done along with other tests if problems develop.  Urine tests to check for infections, diabetes, or protein in the urine.  An ultrasound to confirm the proper growth and development of the baby.  An amniocentesis to check for possible genetic problems.  Fetal screens for  spina bifida and Down syndrome.  You may need other tests to make sure you and the baby are doing well. HOME CARE INSTRUCTIONS  Medicines  Follow your health care provider's instructions regarding medicine use. Specific medicines may be either safe or unsafe to take during pregnancy.  Take your prenatal vitamins as directed.  If you develop constipation, try taking a stool softener if your health care provider approves. Diet  Eat regular, well-balanced meals. Choose a variety of foods, such as meat or vegetable-based protein, fish, milk and low-fat dairy products, vegetables, fruits, and whole grain breads and cereals. Your health care provider will help you determine the amount of weight gain that is right for you.  Avoid raw meat and uncooked cheese. These carry germs that can cause birth defects in the baby.  Eating four or five small meals rather than three large meals a day may help relieve nausea and vomiting. If you start to feel nauseous, eating a few soda crackers can be helpful. Drinking liquids between meals instead of during meals also seems to help nausea and vomiting.  If you develop constipation, eat more high-fiber foods, such as fresh vegetables or fruit and whole grains. Drink enough fluids to keep your urine clear or pale yellow. Activity and Exercise  Exercise only as directed by your health care provider. Exercising will help you:  Control your weight.  Stay in shape.  Be prepared for labor and delivery.  Experiencing pain or cramping in the lower abdomen or low back is a good sign that you should stop exercising. Check with your health care provider before continuing normal exercises.  Try to avoid standing for long periods of time. Move your legs often if you must stand in one place for a long time.  Avoid heavy lifting.  Wear low-heeled shoes, and practice good posture.  You may continue to have sex unless your health care provider directs you  otherwise. Relief of Pain or Discomfort  Wear a good support bra for breast tenderness.   Take warm sitz baths to soothe any pain or discomfort caused by hemorrhoids. Use hemorrhoid cream if your health care provider approves.   Rest with your legs elevated if you have leg cramps or low back pain.  If you develop varicose veins in your legs, wear support hose. Elevate your feet for 15 minutes, 3-4 times a day. Limit salt in your diet. Prenatal Care  Schedule your prenatal visits by the twelfth week of pregnancy. They are usually scheduled monthly at first, then more often in the last 2 months before delivery.  Write down your questions. Take them to your prenatal visits.  Keep all your prenatal visits as directed by your health care provider. Safety  Wear your seat belt at all times when driving.  Make a list of emergency phone numbers, including numbers for family, friends, the hospital, and police and fire departments. General Tips    Ask your health care provider for a referral to a local prenatal education class. Begin classes no later than at the beginning of month 6 of your pregnancy.  Ask for help if you have counseling or nutritional needs during pregnancy. Your health care provider can offer advice or refer you to specialists for help with various needs.  Do not use hot tubs, steam rooms, or saunas.  Do not douche or use tampons or scented sanitary pads.  Do not cross your legs for long periods of time.  Avoid cat litter boxes and soil used by cats. These carry germs that can cause birth defects in the baby and possibly loss of the fetus by miscarriage or stillbirth.  Avoid all smoking, herbs, alcohol, and medicines not prescribed by your health care provider. Chemicals in these affect the formation and growth of the baby.  Schedule a dentist appointment. At home, brush your teeth with a soft toothbrush and be gentle when you floss. SEEK MEDICAL CARE IF:   You have  dizziness.  You have mild pelvic cramps, pelvic pressure, or nagging pain in the abdominal area.  You have persistent nausea, vomiting, or diarrhea.  You have a bad smelling vaginal discharge.  You have pain with urination.  You notice increased swelling in your face, hands, legs, or ankles. SEEK IMMEDIATE MEDICAL CARE IF:   You have a fever.  You are leaking fluid from your vagina.  You have spotting or bleeding from your vagina.  You have severe abdominal cramping or pain.  You have rapid weight gain or loss.  You vomit blood or material that looks like coffee grounds.  You are exposed to German measles and have never had them.  You are exposed to fifth disease or chickenpox.  You develop a severe headache.  You have shortness of breath.  You have any kind of trauma, such as from a fall or a car accident. Document Released: 05/28/2001 Document Revised: 10/18/2013 Document Reviewed: 04/13/2013 [employer] Patient Information 2015 [employer]. This information is not intended to replace advice given to you by your health care provider. Make sure you discuss any questions you have with your health care provider.  

## 2014-03-16 NOTE — Progress Notes (Signed)
U/S(12+0wks)-single active fetus, CRL c/w dates, anterior Gr 0 placenta, cx appears closed, bilateral adnexa appears WNL, NB present, NT-1.6547mm, FHR-163 bpm

## 2014-03-22 ENCOUNTER — Other Ambulatory Visit: Payer: Managed Care, Other (non HMO)

## 2014-03-22 ENCOUNTER — Encounter: Payer: Managed Care, Other (non HMO) | Admitting: Women's Health

## 2014-03-22 LAB — MATERNAL SCREEN, INTEGRATED #1

## 2014-04-05 ENCOUNTER — Telehealth: Payer: Self-pay | Admitting: Women's Health

## 2014-04-05 ENCOUNTER — Other Ambulatory Visit: Payer: Self-pay | Admitting: *Deleted

## 2014-04-05 NOTE — Telephone Encounter (Signed)
Pt states she is starting to get a yeast infection and wanted to know what she could take over the counter for it.  I informed her that she needs to be seen to make sure that is what she has and then we could give her a RX for it.  Pt states she can not come in due to work schedule.  I spoke with Joellyn HaffKim Booker, CNM and she states that the pt can try OTC Monistat but do not do less than the 7 day treatment, if still has symptoms after that she would need an office visit.  Pt verbalized understanding.

## 2014-04-06 ENCOUNTER — Telehealth: Payer: Self-pay | Admitting: Women's Health

## 2014-04-06 MED ORDER — FLUCONAZOLE 150 MG PO TABS: 150.0000 mg | ORAL_TABLET | Freq: Once | ORAL | Status: DC

## 2014-04-06 NOTE — Telephone Encounter (Signed)
Pt informed that medication was sent to her pharmacy.

## 2014-04-06 NOTE — Telephone Encounter (Signed)
Pt called yesterday regarding a yeast infection.  Pt states that she used the OTC Monistat last night about 11:30pm and then woke up about 1am this morning with cramping.  She has not had any more cramping today and no bleeding.  Pt states she has had some itching and irritation X 2 days, slight white DC yesterday morning and she gets frequent yeast infections while pregnant, she is scheduled for an office visit next Thursday 04/14/14 with Drenda FreezeFran but worried that if she waits that long to get any treatment the yeast infection will get much worse.  She cant get off work any this week to come into the office.  Please advise.

## 2014-04-14 ENCOUNTER — Encounter: Payer: Self-pay | Admitting: Advanced Practice Midwife

## 2014-04-14 ENCOUNTER — Ambulatory Visit (INDEPENDENT_AMBULATORY_CARE_PROVIDER_SITE_OTHER): Payer: Managed Care, Other (non HMO) | Admitting: Advanced Practice Midwife

## 2014-04-14 VITALS — BP 110/80 | Wt 179.0 lb

## 2014-04-14 DIAGNOSIS — Z331 Pregnant state, incidental: Secondary | ICD-10-CM

## 2014-04-14 DIAGNOSIS — Z3682 Encounter for antenatal screening for nuchal translucency: Secondary | ICD-10-CM

## 2014-04-14 DIAGNOSIS — Z3492 Encounter for supervision of normal pregnancy, unspecified, second trimester: Secondary | ICD-10-CM

## 2014-04-14 DIAGNOSIS — Z3482 Encounter for supervision of other normal pregnancy, second trimester: Secondary | ICD-10-CM

## 2014-04-14 DIAGNOSIS — Z23 Encounter for immunization: Secondary | ICD-10-CM

## 2014-04-14 DIAGNOSIS — Z1389 Encounter for screening for other disorder: Secondary | ICD-10-CM

## 2014-04-14 DIAGNOSIS — Z36 Encounter for antenatal screening of mother: Secondary | ICD-10-CM

## 2014-04-14 LAB — POCT URINALYSIS DIPSTICK
Blood, UA: NEGATIVE
Glucose, UA: NEGATIVE
Ketones, UA: NEGATIVE
Leukocytes, UA: NEGATIVE
Nitrite, UA: NEGATIVE
Protein, UA: NEGATIVE

## 2014-04-14 NOTE — Progress Notes (Addendum)
[id number] Estimated Date of Delivery: 09/28/14  Blood pressure 110/80, weight 179 lb (81.194 kg), last menstrual period 12/22/2013.   BP weight and urine results all reviewed and noted.  Please refer to the obstetrical flow sheet for the fundal height and fetal heart rate documentation:  Patient reports good fetal movement, denies any bleeding and no rupture of membranes symptoms or regular contractions. Patient is without complaints. Nausea is better, but still doesn't have an appetite in the evening.  Recommended nutritional shake All questions were answered.  Plan:  Continued routine obstetrical care, 2nd IT  Follow up in 3 weeks for OB appointment, anatomy scan

## 2014-04-18 ENCOUNTER — Encounter: Payer: Self-pay | Admitting: Advanced Practice Midwife

## 2014-04-20 LAB — MATERNAL SCREEN, INTEGRATED #2
AFP MoM: 1.16
AFP, Serum: 36.2 ng/mL
Age risk Down Syndrome: 1:680 {titer}
Calculated Gestational Age: 16.6
Crown Rump Length: 58.7 mm
Estriol Mom: 0.77
Estriol, Free: 0.69 ng/mL
Inhibin A Dimeric: 263 pg/mL
Inhibin A MoM: 1.71
MSS Down Syndrome: <1:5000 {titer}
MSS Trisomy 18 Risk: <1:5000 {titer}
NT MoM: 1.08
Nuchal Translucency: 1.47 mm
Number of fetuses: 1
PAPP-A MoM: 0.89
PAPP-A: 445 ng/mL
Rish for ONTD: <1:5000 {titer}
hCG MoM: 1.06
hCG, Serum: 29.8 IU/mL

## 2014-05-05 ENCOUNTER — Ambulatory Visit (INDEPENDENT_AMBULATORY_CARE_PROVIDER_SITE_OTHER): Payer: Managed Care, Other (non HMO)

## 2014-05-05 ENCOUNTER — Ambulatory Visit (INDEPENDENT_AMBULATORY_CARE_PROVIDER_SITE_OTHER): Payer: Managed Care, Other (non HMO) | Admitting: Advanced Practice Midwife

## 2014-05-05 ENCOUNTER — Encounter: Payer: Self-pay | Admitting: Advanced Practice Midwife

## 2014-05-05 ENCOUNTER — Other Ambulatory Visit: Payer: Self-pay | Admitting: Advanced Practice Midwife

## 2014-05-05 VITALS — BP 110/70 | Wt 182.0 lb

## 2014-05-05 DIAGNOSIS — O09292 Supervision of pregnancy with other poor reproductive or obstetric history, second trimester: Secondary | ICD-10-CM

## 2014-05-05 DIAGNOSIS — Z3482 Encounter for supervision of other normal pregnancy, second trimester: Secondary | ICD-10-CM

## 2014-05-05 DIAGNOSIS — Z3689 Encounter for other specified antenatal screening: Secondary | ICD-10-CM

## 2014-05-05 DIAGNOSIS — Z1389 Encounter for screening for other disorder: Secondary | ICD-10-CM

## 2014-05-05 DIAGNOSIS — Z331 Pregnant state, incidental: Secondary | ICD-10-CM

## 2014-05-05 DIAGNOSIS — Z3492 Encounter for supervision of normal pregnancy, unspecified, second trimester: Secondary | ICD-10-CM

## 2014-05-05 DIAGNOSIS — Z36 Encounter for antenatal screening of mother: Secondary | ICD-10-CM

## 2014-05-05 NOTE — Progress Notes (Signed)
Pt denies any problems or concerns at this time.  

## 2014-05-05 NOTE — Progress Notes (Signed)
[id number] Estimated Date of Delivery: 09/28/14  Last menstrual period 12/22/2013.   BP weight and urine results all reviewed and noted.  Please refer to the obstetrical flow sheet for the fundal height and fetal heart rate documentation: Had US today:  U/S(19+1wks)-active fetus, meas c/w dates, fluid wnl, anterior Gr 0 placenta, cx appears closed (3.0cm), bilateral adnexa appears WNL FHR-145 bpm, female fetus, no major abnl noted   Patient reports good fetal movement, denies any bleeding and no rupture of membranes symptoms or regular contractions. Patient is without complaints. All questions were answered.  Plan:  Continued routine obstetrical care,   Follow up in 4 weeks for OB appointment,

## 2014-05-05 NOTE — Progress Notes (Signed)
U/S(19+1wks)-active fetus, meas c/w dates, fluid wnl, anterior Gr 0 placenta, cx appears closed (3.0cm), bilateral adnexa appears WNL FHR-145 bpm, female fetus, no major abnl noted

## 2014-05-26 ENCOUNTER — Telehealth: Payer: Self-pay | Admitting: Women's Health

## 2014-05-26 NOTE — Telephone Encounter (Signed)
Pt c/o scratchy sore throat, cough,stuffy nose, no fever. Pt informed she can take OTC Robitussin or Delsym, gargle with warm salt water, can use lozenges. If no improvement call our office back. Pt verbalized understanding.

## 2014-06-02 ENCOUNTER — Ambulatory Visit (INDEPENDENT_AMBULATORY_CARE_PROVIDER_SITE_OTHER): Payer: Managed Care, Other (non HMO) | Admitting: Advanced Practice Midwife

## 2014-06-02 VITALS — BP 120/80 | Wt 185.0 lb

## 2014-06-02 DIAGNOSIS — Z3482 Encounter for supervision of other normal pregnancy, second trimester: Secondary | ICD-10-CM

## 2014-06-02 DIAGNOSIS — Z331 Pregnant state, incidental: Secondary | ICD-10-CM

## 2014-06-02 DIAGNOSIS — Z3492 Encounter for supervision of normal pregnancy, unspecified, second trimester: Secondary | ICD-10-CM

## 2014-06-02 DIAGNOSIS — Z1389 Encounter for screening for other disorder: Secondary | ICD-10-CM

## 2014-06-02 LAB — POCT URINALYSIS DIPSTICK
GLUCOSE UA: NEGATIVE
Glucose, UA: NEGATIVE
Ketones, UA: NEGATIVE
Leukocytes, UA: NEGATIVE
NITRITE UA: NEGATIVE
Protein, UA: NEGATIVE
Protein, UA: NEGATIVE
RBC UA: NEGATIVE

## 2014-06-02 NOTE — Progress Notes (Signed)
[id number] Estimated Date of Delivery: 09/28/14  Blood pressure 120/80, weight 185 lb (83.915 kg), last menstrual period 12/22/2013.   BP weight and urine results all reviewed and noted.  Please refer to the obstetrical flow sheet for the fundal height and fetal heart rate documentation:  Patient reports good fetal movement, denies any bleeding and no rupture of membranes symptoms or regular contractions. Patient is without complaints. All questions were answered.  Plan:  Continued routine obstetrical care,   Follow up in 4 weeks for OB appointment, PN2

## 2014-06-02 NOTE — Patient Instructions (Signed)

## 2014-06-17 NOTE — L&D Delivery Note (Cosign Needed)
Delivery Note At 7:21 PM a viable and healthy female was delivered via Vaginal, Spontaneous Delivery (Presentation: Right Occiput Anterior).  APGAR: 9, 9; weight pending.   Placenta status: Intact, Spontaneous.  Cord: 3 vessels with the following complications: None.    Anesthesia: Epidural  Episiotomy: None Lacerations:  None Est. Blood Loss (mL):  300   Mom to postpartum.  Baby to Couplet care / Skin to Skin.  Rochele PagesKARIM, Jru Pense N 10/04/2014, 7:44 PM

## 2014-06-24 ENCOUNTER — Telehealth: Payer: Self-pay | Admitting: Obstetrics and Gynecology

## 2014-06-24 DIAGNOSIS — Z3482 Encounter for supervision of other normal pregnancy, second trimester: Secondary | ICD-10-CM

## 2014-06-24 NOTE — Telephone Encounter (Signed)
Pt states that she hurt all over yesterday and today her ear is hurting. Pt states that she is very congested, runny, stuffy nose. Not sure of fever. Pt denies any N/V or diarrhea. Pt was advised to take sudafed, warm salt water gargles, robitussin and cough drops for the symptoms. Pt was also advised to call the office back if the symptoms are not better. Pt verbalized understanding.

## 2014-06-30 ENCOUNTER — Other Ambulatory Visit: Payer: Managed Care, Other (non HMO)

## 2014-06-30 ENCOUNTER — Ambulatory Visit (INDEPENDENT_AMBULATORY_CARE_PROVIDER_SITE_OTHER): Payer: Managed Care, Other (non HMO) | Admitting: Advanced Practice Midwife

## 2014-06-30 ENCOUNTER — Encounter: Payer: Self-pay | Admitting: Advanced Practice Midwife

## 2014-06-30 VITALS — BP 120/76 | Wt 191.0 lb

## 2014-06-30 DIAGNOSIS — Z3482 Encounter for supervision of other normal pregnancy, second trimester: Secondary | ICD-10-CM

## 2014-06-30 DIAGNOSIS — Z113 Encounter for screening for infections with a predominantly sexual mode of transmission: Secondary | ICD-10-CM

## 2014-06-30 DIAGNOSIS — Z131 Encounter for screening for diabetes mellitus: Secondary | ICD-10-CM

## 2014-06-30 DIAGNOSIS — Z0184 Encounter for antibody response examination: Secondary | ICD-10-CM

## 2014-06-30 DIAGNOSIS — Z114 Encounter for screening for human immunodeficiency virus [HIV]: Secondary | ICD-10-CM

## 2014-06-30 DIAGNOSIS — Z1389 Encounter for screening for other disorder: Secondary | ICD-10-CM

## 2014-06-30 DIAGNOSIS — B009 Herpesviral infection, unspecified: Secondary | ICD-10-CM

## 2014-06-30 DIAGNOSIS — Z331 Pregnant state, incidental: Secondary | ICD-10-CM

## 2014-06-30 LAB — POCT URINALYSIS DIPSTICK
Glucose, UA: NEGATIVE
KETONES UA: NEGATIVE
LEUKOCYTES UA: NEGATIVE
Nitrite, UA: NEGATIVE
Protein, UA: NEGATIVE
RBC UA: NEGATIVE

## 2014-06-30 LAB — CBC
HEMATOCRIT: 34.2 % — AB (ref 36.0–46.0)
Hemoglobin: 11.8 g/dL — ABNORMAL LOW (ref 12.0–15.0)
MCH: 33.1 pg (ref 26.0–34.0)
MCHC: 34.5 g/dL (ref 30.0–36.0)
MCV: 95.8 fL (ref 78.0–100.0)
MPV: 11.1 fL (ref 8.6–12.4)
Platelets: 217 10*3/uL (ref 150–400)
RBC: 3.57 MIL/uL — ABNORMAL LOW (ref 3.87–5.11)
RDW: 12.6 % (ref 11.5–15.5)
WBC: 7.1 10*3/uL (ref 4.0–10.5)

## 2014-06-30 NOTE — Progress Notes (Signed)
[id number] 1972w1d Estimated Date of Delivery: 09/28/14  Blood pressure 120/76, weight 191 lb (86.637 kg), last menstrual period 12/22/2013.   BP weight and urine results all reviewed and noted. Has urinary frequency, but voids adequate amounts.   Please refer to the obstetrical flow sheet for the fundal height and fetal heart rate documentation:  Patient reports good fetal movement, denies any bleeding and no rupture of membranes symptoms or regular contractions. Patient is without complaints. All questions were answered.  Plan:  Continued routine obstetrical care, PN2 today  Follow up in 4 weeks for OB appointment,

## 2014-06-30 NOTE — Progress Notes (Signed)
Pt states that she has urinary frequency but thinks it is because the baby is lying on her bladder. Pt states that she thinks she has had some contractions as well.

## 2014-07-01 LAB — RPR

## 2014-07-01 LAB — HIV ANTIBODY (ROUTINE TESTING W REFLEX): HIV: NONREACTIVE

## 2014-07-01 LAB — ANTIBODY SCREEN: ANTIBODY SCREEN: NEGATIVE

## 2014-07-01 LAB — GLUCOSE TOLERANCE, 2 HOURS W/ 1HR
GLUCOSE, 2 HOUR: 48 mg/dL — AB (ref 70–139)
Glucose, 1 hour: 111 mg/dL (ref 70–170)
Glucose, Fasting: 71 mg/dL (ref 70–99)

## 2014-07-04 LAB — HSV 2 ANTIBODY, IGG: HSV 2 Glycoprotein G Ab, IgG: 3.56 IV — ABNORMAL HIGH

## 2014-07-21 ENCOUNTER — Telehealth: Payer: Self-pay | Admitting: Advanced Practice Midwife

## 2014-07-21 NOTE — Telephone Encounter (Signed)
Pt state swelling in feet. Pt states swelling improves with elevation. Informed pt to push water, decrease salt intake, elevate lower extremities as much as possible if no improvement call our office back. Pt verbalized understanding.

## 2014-07-28 ENCOUNTER — Ambulatory Visit (INDEPENDENT_AMBULATORY_CARE_PROVIDER_SITE_OTHER): Payer: Managed Care, Other (non HMO) | Admitting: Advanced Practice Midwife

## 2014-07-28 VITALS — BP 130/76 | Wt 195.0 lb

## 2014-07-28 DIAGNOSIS — Z331 Pregnant state, incidental: Secondary | ICD-10-CM

## 2014-07-28 DIAGNOSIS — Z1389 Encounter for screening for other disorder: Secondary | ICD-10-CM

## 2014-07-28 DIAGNOSIS — Z3483 Encounter for supervision of other normal pregnancy, third trimester: Secondary | ICD-10-CM

## 2014-07-28 LAB — POCT URINALYSIS DIPSTICK
Blood, UA: NEGATIVE
Glucose, UA: NEGATIVE
Ketones, UA: NEGATIVE
Leukocytes, UA: NEGATIVE
Nitrite, UA: NEGATIVE

## 2014-07-28 NOTE — Progress Notes (Signed)
[id number] Estimated Date of Delivery: 09/28/14  Blood pressure 130/76, weight 195 lb (88.451 kg), last menstrual period 12/22/2013.   BP weight and urine results all reviewed and noted.  Please refer to the obstetrical flow sheet for the fundal height and fetal heart rate documentation:  Patient reports good fetal movement, denies any bleeding and no rupture of membranes symptoms or regular contractions. Patient is without complaints except varicose veins.   All questions were answered.  Plan:  Continued routine obstetrical care, knee high compression socks  Follow up in 2 weeks for OB appointment,

## 2014-08-11 ENCOUNTER — Ambulatory Visit (INDEPENDENT_AMBULATORY_CARE_PROVIDER_SITE_OTHER): Payer: Managed Care, Other (non HMO) | Admitting: Advanced Practice Midwife

## 2014-08-11 ENCOUNTER — Encounter: Payer: Self-pay | Admitting: Advanced Practice Midwife

## 2014-08-11 VITALS — BP 120/70 | HR 88 | Wt 198.0 lb

## 2014-08-11 DIAGNOSIS — Z3483 Encounter for supervision of other normal pregnancy, third trimester: Secondary | ICD-10-CM

## 2014-08-11 DIAGNOSIS — Z1389 Encounter for screening for other disorder: Secondary | ICD-10-CM

## 2014-08-11 DIAGNOSIS — Z331 Pregnant state, incidental: Secondary | ICD-10-CM

## 2014-08-11 LAB — POCT URINALYSIS DIPSTICK
Glucose, UA: NEGATIVE
Ketones, UA: NEGATIVE
LEUKOCYTES UA: NEGATIVE
NITRITE UA: NEGATIVE
PROTEIN UA: NEGATIVE
RBC UA: NEGATIVE

## 2014-08-11 MED ORDER — VALACYCLOVIR HCL 500 MG PO TABS: 1000.0000 mg | ORAL_TABLET | Freq: Two times a day (BID) | ORAL | Status: DC

## 2014-08-11 NOTE — Progress Notes (Signed)
[id number] Estimated Date of Delivery: 09/28/14  Blood pressure 120/70, pulse 88, weight 198 lb (89.812 kg), last menstrual period 12/22/2013.   BP weight and urine results all reviewed and noted.  Please refer to the obstetrical flow sheet for the fundal height and fetal heart rate documentation:  Patient reports good fetal movement, denies any bleeding and no rupture of membranes symptoms or regular contractions. Patient is without complaints other than pregnancy complaints.  All questions were answered.  Plan:  Continued routine obstetrical care, rx for valtrex 500mg  BID sent to pharmacy (HSV2 suppression)  Follow up in 2 weeks for OB appointment,

## 2014-08-11 NOTE — Progress Notes (Signed)
Pt denies any problems or concerns at this time.  

## 2014-08-25 ENCOUNTER — Encounter: Payer: Managed Care, Other (non HMO) | Admitting: Advanced Practice Midwife

## 2014-08-26 ENCOUNTER — Encounter: Payer: Self-pay | Admitting: Obstetrics and Gynecology

## 2014-08-26 ENCOUNTER — Ambulatory Visit (INDEPENDENT_AMBULATORY_CARE_PROVIDER_SITE_OTHER): Payer: Managed Care, Other (non HMO) | Admitting: Obstetrics and Gynecology

## 2014-08-26 VITALS — BP 112/70 | HR 80 | Wt 200.5 lb

## 2014-08-26 DIAGNOSIS — Z3483 Encounter for supervision of other normal pregnancy, third trimester: Secondary | ICD-10-CM

## 2014-08-26 DIAGNOSIS — Z1389 Encounter for screening for other disorder: Secondary | ICD-10-CM

## 2014-08-26 DIAGNOSIS — Z331 Pregnant state, incidental: Secondary | ICD-10-CM

## 2014-08-26 LAB — POCT URINALYSIS DIPSTICK
GLUCOSE UA: NEGATIVE
Ketones, UA: NEGATIVE
Leukocytes, UA: NEGATIVE
Nitrite, UA: NEGATIVE
Protein, UA: NEGATIVE
RBC UA: NEGATIVE

## 2014-08-26 NOTE — Progress Notes (Signed)
[id number] 6831w2d Estimated Date of Delivery: 09/28/14  Blood pressure 112/70, pulse 80, weight 200 lb 8 oz (90.946 kg), last menstrual period 12/22/2013.   refer to the ob flow sheet for FH and FHR, also BP, Wt, Urine results:notable for neg  Patient reports   good fetal movement, denies any bleeding and no rupture of membranes symptoms or regular contractions. Patient complaints:fluid retention. Unable to determine presentation;;> u/s vertex  Questions were answered. Assessment:  Routine prenatal course Plan:  Continued routine obstetrical care, discussed vas.v female options of BC  F/u in 1.5 weeks for GBS

## 2014-08-26 NOTE — Progress Notes (Signed)
Pt denies any problems or concerns at this time.  

## 2014-09-05 ENCOUNTER — Ambulatory Visit (INDEPENDENT_AMBULATORY_CARE_PROVIDER_SITE_OTHER): Payer: Managed Care, Other (non HMO) | Admitting: Women's Health

## 2014-09-05 VITALS — BP 130/90 | HR 80 | Wt 202.0 lb

## 2014-09-05 DIAGNOSIS — O9989 Other specified diseases and conditions complicating pregnancy, childbirth and the puerperium: Secondary | ICD-10-CM

## 2014-09-05 DIAGNOSIS — R1013 Epigastric pain: Secondary | ICD-10-CM

## 2014-09-05 DIAGNOSIS — Z331 Pregnant state, incidental: Secondary | ICD-10-CM

## 2014-09-05 DIAGNOSIS — Z1389 Encounter for screening for other disorder: Secondary | ICD-10-CM

## 2014-09-05 DIAGNOSIS — O26899 Other specified pregnancy related conditions, unspecified trimester: Secondary | ICD-10-CM

## 2014-09-05 DIAGNOSIS — Z3483 Encounter for supervision of other normal pregnancy, third trimester: Secondary | ICD-10-CM

## 2014-09-05 DIAGNOSIS — O26843 Uterine size-date discrepancy, third trimester: Secondary | ICD-10-CM

## 2014-09-05 DIAGNOSIS — Z369 Encounter for antenatal screening, unspecified: Secondary | ICD-10-CM

## 2014-09-05 LAB — POCT URINALYSIS DIPSTICK
GLUCOSE UA: NEGATIVE
Ketones, UA: NEGATIVE
LEUKOCYTES UA: NEGATIVE
NITRITE UA: NEGATIVE
PROTEIN UA: NEGATIVE
RBC UA: NEGATIVE

## 2014-09-05 LAB — OB RESULTS CONSOLE GBS: GBS: POSITIVE

## 2014-09-05 NOTE — Progress Notes (Signed)
Work-in Low-risk OB appointment [id number] 7671w5d Estimated Date of Delivery: 09/28/14 BP 130/90 mmHg  Pulse 80  Wt 202 lb (91.627 kg)  LMP 12/22/2013  BP, weight, and urine reviewed.  Refer to obstetrical flow sheet for FH & FHR.  Reports good fm.  Denies regular uc's, lof, vb, or uti s/s. Thinks she may have lost mucous plug this am. Then began feeling nauseated, no vomiting or diarrhea. Some epigastric and mid back pain. Had country ham biscuit for breakfast this am. Mild ha- not bad enough to take any meds for, no changes in vision.  No ruq/epigastric pain w/ palpation, no pain w/ palpation to mid back GBS collected SVE per request: 1/40/-2, vtx Trace BLE edema, 2+DTRs, no clonus Reviewed ptl s/s, fkc, pre-e s/s . Plan:  Will get pre-e labs and amylase/lipase, cancel tomorrow's appt F/U in 2d for OB appointment/bp check, and s<d u/s

## 2014-09-05 NOTE — Patient Instructions (Signed)
Call the office ([phone]) or go to [facility] if:  You begin to have strong, frequent contractions  Your water breaks.  Sometimes it is a big gush of fluid, sometimes it is just a trickle that keeps getting your panties wet or running down your legs  You have vaginal bleeding.  It is normal to have a small amount of spotting if your cervix was checked.   You don't feel your baby moving like normal.  If you don't, get you something to eat and drink and lay down and focus on feeling your baby move.  You should feel at least 10 movements in 2 hours.  If you don't, you should call the office or go to [facility].    Call the office ([phone]) or go to [facility] for these signs of pre-eclampsia:  Severe headache that does not go away with Tylenol  Visual changes- seeing spots, double, blurred vision  Pain under your right breast or upper abdomen that does not go away with Tums or heartburn medicine  Nausea and/or vomiting  Severe swelling in your hands, feet, and face    Braxton Hicks Contractions Contractions of the uterus can occur throughout pregnancy. Contractions are not always a sign that you are in labor.  WHAT ARE BRAXTON HICKS CONTRACTIONS?  Contractions that occur before labor are called Braxton Hicks contractions, or false labor. Toward the end of pregnancy (32-34 weeks), these contractions can develop more often and may become more forceful. This is not true labor because these contractions do not result in opening (dilatation) and thinning of the cervix. They are sometimes difficult to tell apart from true labor because these contractions can be forceful and people have different pain tolerances. You should not feel embarrassed if you go to the hospital with false labor. Sometimes, the only way to tell if you are in true labor is for your health care provider to look for changes in the cervix. If there are no prenatal problems or other health problems associated  with the pregnancy, it is completely safe to be sent home with false labor and await the onset of true labor. HOW CAN YOU TELL THE DIFFERENCE BETWEEN TRUE AND FALSE LABOR? False Labor  The contractions of false labor are usually shorter and not as hard as those of true labor.   The contractions are usually irregular.   The contractions are often felt in the front of the lower abdomen and in the groin.   The contractions may go away when you walk around or change positions while lying down.   The contractions get weaker and are shorter lasting as time goes on.   The contractions do not usually become progressively stronger, regular, and closer together as with true labor.  True Labor  Contractions in true labor last 30-70 seconds, become very regular, usually become more intense, and increase in frequency.   The contractions do not go away with walking.   The discomfort is usually felt in the top of the uterus and spreads to the lower abdomen and low back.   True labor can be determined by your health care provider with an exam. This will show that the cervix is dilating and getting thinner.  WHAT TO REMEMBER  Keep up with your usual exercises and follow other instructions given by your health care provider.   Take medicines as directed by your health care provider.   Keep your regular prenatal appointments.   Eat and drink lightly if you   think you are going into labor.   If Braxton Hicks contractions are making you uncomfortable:   Change your position from lying down or resting to walking, or from walking to resting.   Sit and rest in a tub of warm water.   Drink 2-3 glasses of water. Dehydration may cause these contractions.   Do slow and deep breathing several times an hour.  WHEN SHOULD I SEEK IMMEDIATE MEDICAL CARE? Seek immediate medical care if:  Your contractions become stronger, more regular, and closer together.   You have fluid leaking or  gushing from your vagina.   You have a fever.   You [place] blood-tinged mucus.   You have vaginal bleeding.   You have continuous abdominal pain.   You have low back pain that you never had before.   You feel your baby's head pushing down and causing pelvic pressure.   Your baby is not moving as much as it used to.  Document Released: 06/03/2005 Document Revised: 06/08/2013 Document Reviewed: 03/15/2013 [employer] Patient Information 2015 [employer]. This information is not intended to replace advice given to you by your health care provider. Make sure you discuss any questions you have with your health care provider.  

## 2014-09-06 ENCOUNTER — Encounter: Payer: Managed Care, Other (non HMO) | Admitting: Advanced Practice Midwife

## 2014-09-06 LAB — CBC
HEMATOCRIT: 33.8 % — AB (ref 34.0–46.6)
HEMOGLOBIN: 11.7 g/dL (ref 11.1–15.9)
MCH: 32.5 pg (ref 26.6–33.0)
MCHC: 34.6 g/dL (ref 31.5–35.7)
MCV: 94 fL (ref 79–97)
Platelets: 206 10*3/uL (ref 150–379)
RBC: 3.6 x10E6/uL — AB (ref 3.77–5.28)
RDW: 13.3 % (ref 12.3–15.4)
WBC: 7.7 10*3/uL (ref 3.4–10.8)

## 2014-09-06 LAB — COMPREHENSIVE METABOLIC PANEL
A/G RATIO: 1.4 (ref 1.1–2.5)
ALT: 21 IU/L (ref 0–32)
AST: 17 IU/L (ref 0–40)
Albumin: 3.7 g/dL (ref 3.5–5.5)
Alkaline Phosphatase: 89 IU/L (ref 39–117)
BUN/Creatinine Ratio: 13 (ref 8–20)
BUN: 7 mg/dL (ref 6–20)
Bilirubin Total: 0.3 mg/dL (ref 0.0–1.2)
CO2: 22 mmol/L (ref 18–29)
Calcium: 8.7 mg/dL (ref 8.7–10.2)
Chloride: 102 mmol/L (ref 97–108)
Creatinine, Ser: 0.55 mg/dL — ABNORMAL LOW (ref 0.57–1.00)
GFR calc Af Amer: 146 mL/min/{1.73_m2} (ref >59)
GFR, EST NON AFRICAN AMERICAN: 126 mL/min/{1.73_m2} (ref >59)
GLOBULIN, TOTAL: 2.7 g/dL (ref 1.5–4.5)
GLUCOSE: 74 mg/dL (ref 65–99)
Potassium: 3.4 mmol/L — ABNORMAL LOW (ref 3.5–5.2)
Sodium: 139 mmol/L (ref 134–144)
TOTAL PROTEIN: 6.4 g/dL (ref 6.0–8.5)

## 2014-09-06 LAB — PROTEIN / CREATININE RATIO, URINE
Creatinine, Urine: 113.2 mg/dL (ref 16.0–327.0)
PROTEIN UR: 14.8 mg/dL (ref 0.0–15.0)
PROTEIN/CREAT RATIO: 131 mg/g{creat} (ref 0–200)

## 2014-09-06 LAB — AMYLASE: AMYLASE: 49 U/L (ref 31–124)

## 2014-09-06 LAB — LIPASE: LIPASE: 19 U/L (ref 0–59)

## 2014-09-07 LAB — GC/CHLAMYDIA PROBE AMP
Chlamydia trachomatis, NAA: NEGATIVE
NEISSERIA GONORRHOEAE BY PCR: NEGATIVE

## 2014-09-07 LAB — OB RESULTS CONSOLE GC/CHLAMYDIA
CHLAMYDIA, DNA PROBE: NEGATIVE
GC PROBE AMP, GENITAL: NEGATIVE

## 2014-09-08 ENCOUNTER — Ambulatory Visit (INDEPENDENT_AMBULATORY_CARE_PROVIDER_SITE_OTHER): Payer: Managed Care, Other (non HMO) | Admitting: Advanced Practice Midwife

## 2014-09-08 ENCOUNTER — Ambulatory Visit (INDEPENDENT_AMBULATORY_CARE_PROVIDER_SITE_OTHER): Payer: Managed Care, Other (non HMO)

## 2014-09-08 ENCOUNTER — Encounter: Payer: Self-pay | Admitting: Advanced Practice Midwife

## 2014-09-08 VITALS — BP 110/78 | HR 84 | Wt 201.5 lb

## 2014-09-08 DIAGNOSIS — O26843 Uterine size-date discrepancy, third trimester: Secondary | ICD-10-CM | POA: Diagnosis not present

## 2014-09-08 DIAGNOSIS — Z1389 Encounter for screening for other disorder: Secondary | ICD-10-CM

## 2014-09-08 DIAGNOSIS — Z3483 Encounter for supervision of other normal pregnancy, third trimester: Secondary | ICD-10-CM

## 2014-09-08 DIAGNOSIS — Z331 Pregnant state, incidental: Secondary | ICD-10-CM

## 2014-09-08 LAB — POCT URINALYSIS DIPSTICK
Glucose, UA: NEGATIVE
Ketones, UA: NEGATIVE
Leukocytes, UA: NEGATIVE
NITRITE UA: NEGATIVE
Protein, UA: NEGATIVE
RBC UA: NEGATIVE

## 2014-09-08 LAB — CULTURE, BETA STREP (GROUP B ONLY): Strep Gp B Culture: POSITIVE — AB

## 2014-09-08 NOTE — Progress Notes (Signed)
Pt denies any problems or concerns but wanted to discuss hands going numb and then they hurt.

## 2014-09-08 NOTE — Patient Instructions (Signed)

## 2014-09-08 NOTE — Progress Notes (Signed)
[id number] 7847w1d Estimated Date of Delivery: 09/28/14  Blood pressure 110/78, pulse 84, weight 201 lb 8 oz (91.4 kg), last menstrual period 12/22/2013.   BP weight and urine results all reviewed and noted.  Please refer to the obstetrical flow sheet for the fundal height and fetal heart rate documentation: US for size<dates:  US [redacted]W[redacted]D ,EFW 3084g (6lb 13oz )c/w dates,ant pl grade 3,cephalic,afi 13.7cm,cx appears closed,bilat adnexa wnl Patient reports good fetal movement, denies any bleeding and no rupture of membranes symptoms or regular contractions. Patient is without complaints. Feels much better than she did last week  All questions were answered.  Plan:  Continued routine obstetrical care,  Discussed daily kick counts  Follow up in 1weeks for OB appointment,

## 2014-09-08 NOTE — Progress Notes (Signed)
US [redacted]W[redacted]D ,EFW 3084g (6lb 13oz )c/w dates,ant pl grade 3,cephalic,afi 13.7cm,cx appears closed,bilat adnexa wnl  

## 2014-09-08 NOTE — Progress Notes (Deleted)
US [redacted]W[redacted]D ,EFW 3084g (6lb 13oz )c/w dates,ant pl grade 3,cephalic,afi 13.7cm,cx appears closed,bilat adnexa wnl

## 2014-09-14 ENCOUNTER — Ambulatory Visit (INDEPENDENT_AMBULATORY_CARE_PROVIDER_SITE_OTHER): Payer: Managed Care, Other (non HMO) | Admitting: Women's Health

## 2014-09-14 VITALS — BP 120/86 | HR 85 | Wt 203.0 lb

## 2014-09-14 DIAGNOSIS — Z1389 Encounter for screening for other disorder: Secondary | ICD-10-CM

## 2014-09-14 DIAGNOSIS — Z3483 Encounter for supervision of other normal pregnancy, third trimester: Secondary | ICD-10-CM

## 2014-09-14 DIAGNOSIS — Z331 Pregnant state, incidental: Secondary | ICD-10-CM

## 2014-09-14 LAB — POCT URINALYSIS DIPSTICK
Blood, UA: NEGATIVE
Glucose, UA: NEGATIVE
KETONES UA: NEGATIVE
Leukocytes, UA: NEGATIVE
Nitrite, UA: NEGATIVE
Protein, UA: NEGATIVE

## 2014-09-14 NOTE — Progress Notes (Signed)
Low-risk OB appointment G3P1011 4440w0d Estimated Date of Delivery: 09/28/14 BP 120/86 mmHg  Pulse 85  Wt 203 lb (92.08 kg)  LMP 12/22/2013  BP, weight, and urine reviewed.  Refer to obstetrical flow sheet for FH & FHR.  Reports good fm.  Denies regular uc's, lof, vb, or uti s/s. No complaints. SVE per request: 1/50/-2, vtx Reviewed labor s/s, fkc. Plan:  Continue routine obstetrical care  F/U in 1wk for OB appointment

## 2014-09-14 NOTE — Patient Instructions (Signed)
Call the office ([phone]) or go to [facility] if:  You begin to have strong, frequent contractions  Your water breaks.  Sometimes it is a big gush of fluid, sometimes it is just a trickle that keeps getting your panties wet or running down your legs  You have vaginal bleeding.  It is normal to have a small amount of spotting if your cervix was checked.   You don't feel your baby moving like normal.  If you don't, get you something to eat and drink and lay down and focus on feeling your baby move.  You should feel at least 10 movements in 2 hours.  If you don't, you should call the office or go to [facility].    Braxton Hicks Contractions Contractions of the uterus can occur throughout pregnancy. Contractions are not always a sign that you are in labor.  WHAT ARE BRAXTON HICKS CONTRACTIONS?  Contractions that occur before labor are called Braxton Hicks contractions, or false labor. Toward the end of pregnancy (32-34 weeks), these contractions can develop more often and may become more forceful. This is not true labor because these contractions do not result in opening (dilatation) and thinning of the cervix. They are sometimes difficult to tell apart from true labor because these contractions can be forceful and people have different pain tolerances. You should not feel embarrassed if you go to the hospital with false labor. Sometimes, the only way to tell if you are in true labor is for your health care provider to look for changes in the cervix. If there are no prenatal problems or other health problems associated with the pregnancy, it is completely safe to be sent home with false labor and await the onset of true labor. HOW CAN YOU TELL THE DIFFERENCE BETWEEN TRUE AND FALSE LABOR? False Labor  The contractions of false labor are usually shorter and not as hard as those of true labor.   The contractions are usually irregular.   The contractions are often felt in the front of  the lower abdomen and in the groin.   The contractions may go away when you walk around or change positions while lying down.   The contractions get weaker and are shorter lasting as time goes on.   The contractions do not usually become progressively stronger, regular, and closer together as with true labor.  True Labor  Contractions in true labor last 30-70 seconds, become very regular, usually become more intense, and increase in frequency.   The contractions do not go away with walking.   The discomfort is usually felt in the top of the uterus and spreads to the lower abdomen and low back.   True labor can be determined by your health care provider with an exam. This will show that the cervix is dilating and getting thinner.  WHAT TO REMEMBER  Keep up with your usual exercises and follow other instructions given by your health care provider.   Take medicines as directed by your health care provider.   Keep your regular prenatal appointments.   Eat and drink lightly if you think you are going into labor.   If Braxton Hicks contractions are making you uncomfortable:   Change your position from lying down or resting to walking, or from walking to resting.   Sit and rest in a tub of warm water.   Drink 2-3 glasses of water. Dehydration may cause these contractions.   Do slow and deep breathing several times an hour.    WHEN SHOULD I SEEK IMMEDIATE MEDICAL CARE? Seek immediate medical care if:  Your contractions become stronger, more regular, and closer together.   You have fluid leaking or gushing from your vagina.   You have a fever.   You Dunk blood-tinged mucus.   You have vaginal bleeding.   You have continuous abdominal pain.   You have low back pain that you never had before.   You feel your baby's head pushing down and causing pelvic pressure.   Your baby is not moving as much as it used to.  Document Released: 06/03/2005 Document  Revised: 06/08/2013 Document Reviewed: 03/15/2013 Exit[employer] Patient Information 2015 [employer]. This information is not intended to replace advice given to you by your health care provider. Make sure you discuss any questions you have with your health care provider.  

## 2014-09-22 ENCOUNTER — Encounter: Payer: Self-pay | Admitting: Obstetrics & Gynecology

## 2014-09-22 ENCOUNTER — Ambulatory Visit (INDEPENDENT_AMBULATORY_CARE_PROVIDER_SITE_OTHER): Payer: Managed Care, Other (non HMO) | Admitting: Obstetrics & Gynecology

## 2014-09-22 VITALS — BP 128/80 | HR 82 | Wt 209.0 lb

## 2014-09-22 DIAGNOSIS — Z3483 Encounter for supervision of other normal pregnancy, third trimester: Secondary | ICD-10-CM

## 2014-09-22 DIAGNOSIS — Z331 Pregnant state, incidental: Secondary | ICD-10-CM

## 2014-09-22 DIAGNOSIS — Z1389 Encounter for screening for other disorder: Secondary | ICD-10-CM

## 2014-09-22 LAB — POCT URINALYSIS DIPSTICK
Blood, UA: NEGATIVE
Glucose, UA: NEGATIVE
Ketones, UA: NEGATIVE
Leukocytes, UA: NEGATIVE
NITRITE UA: NEGATIVE

## 2014-09-22 NOTE — Progress Notes (Signed)
[id number] 2549w1d Estimated Date of Delivery: 09/28/14  Blood pressure 128/80, pulse 82, weight 209 lb (94.802 kg), last menstrual period 12/22/2013.   BP weight and urine results all reviewed and noted.  Please refer to the obstetrical flow sheet for the fundal height and fetal heart rate documentation:  Patient reports good fetal movement, denies any bleeding and no rupture of membranes symptoms or regular contractions. Patient is without complaints. All questions were answered.  Plan:  Continued routine obstetrical care,   Follow up in 1 weeks for OB appointment, routine

## 2014-09-29 ENCOUNTER — Encounter: Payer: Self-pay | Admitting: Obstetrics & Gynecology

## 2014-09-29 ENCOUNTER — Ambulatory Visit (INDEPENDENT_AMBULATORY_CARE_PROVIDER_SITE_OTHER): Payer: Managed Care, Other (non HMO) | Admitting: Obstetrics & Gynecology

## 2014-09-29 VITALS — BP 120/78 | HR 72 | Wt 207.5 lb

## 2014-09-29 DIAGNOSIS — Z3483 Encounter for supervision of other normal pregnancy, third trimester: Secondary | ICD-10-CM | POA: Diagnosis not present

## 2014-09-29 DIAGNOSIS — Z331 Pregnant state, incidental: Secondary | ICD-10-CM

## 2014-09-29 DIAGNOSIS — Z1389 Encounter for screening for other disorder: Secondary | ICD-10-CM

## 2014-09-29 LAB — POCT URINALYSIS DIPSTICK
Glucose, UA: NEGATIVE
Ketones, UA: NEGATIVE
LEUKOCYTES UA: NEGATIVE
Nitrite, UA: NEGATIVE

## 2014-09-29 NOTE — Progress Notes (Signed)
[id number] 4328w1d Estimated Date of Delivery: 09/28/14  Blood pressure 120/78, pulse 72, weight 207 lb 8 oz (94.121 kg), last menstrual period 12/22/2013.   BP weight and urine results all reviewed and noted.  Please refer to the obstetrical flow sheet for the fundal height and fetal heart rate documentation:  Patient reports good fetal movement, denies any bleeding and no rupture of membranes symptoms or regular contractions. Patient is without complaints. All questions were answered.  Plan:  Continued routine obstetrical care,   Follow up in 6 weeks for OB appointment, pp exam

## 2014-10-03 ENCOUNTER — Telehealth (HOSPITAL_COMMUNITY): Payer: Self-pay | Admitting: *Deleted

## 2014-10-03 ENCOUNTER — Encounter (HOSPITAL_COMMUNITY): Payer: Self-pay | Admitting: *Deleted

## 2014-10-03 NOTE — Telephone Encounter (Signed)
Preadmission screen  

## 2014-10-04 ENCOUNTER — Inpatient Hospital Stay (HOSPITAL_COMMUNITY): Payer: Managed Care, Other (non HMO) | Admitting: Anesthesiology

## 2014-10-04 ENCOUNTER — Inpatient Hospital Stay (HOSPITAL_COMMUNITY)
Admission: AD | Admit: 2014-10-04 | Discharge: 2014-10-06 | DRG: 774 | Disposition: A | Discharge status: Home / Self Care | Payer: Managed Care, Other (non HMO) | Source: Ambulatory Visit | Attending: Family Medicine | Admitting: Family Medicine

## 2014-10-04 ENCOUNTER — Encounter (HOSPITAL_COMMUNITY): Payer: Self-pay | Admitting: *Deleted

## 2014-10-04 DIAGNOSIS — Z8249 Family history of ischemic heart disease and other diseases of the circulatory system: Secondary | ICD-10-CM | POA: Diagnosis not present

## 2014-10-04 DIAGNOSIS — O99334 Smoking (tobacco) complicating childbirth: Secondary | ICD-10-CM | POA: Diagnosis present

## 2014-10-04 DIAGNOSIS — Z3A40 40 weeks gestation of pregnancy: Secondary | ICD-10-CM | POA: Diagnosis present

## 2014-10-04 DIAGNOSIS — IMO0001 Reserved for inherently not codable concepts without codable children: Secondary | ICD-10-CM

## 2014-10-04 DIAGNOSIS — Z833 Family history of diabetes mellitus: Secondary | ICD-10-CM | POA: Diagnosis not present

## 2014-10-04 DIAGNOSIS — O99824 Streptococcus B carrier state complicating childbirth: Secondary | ICD-10-CM | POA: Diagnosis present

## 2014-10-04 DIAGNOSIS — O480 Post-term pregnancy: Secondary | ICD-10-CM

## 2014-10-04 DIAGNOSIS — F17210 Nicotine dependence, cigarettes, uncomplicated: Secondary | ICD-10-CM | POA: Diagnosis present

## 2014-10-04 DIAGNOSIS — O9832 Other infections with a predominantly sexual mode of transmission complicating childbirth: Secondary | ICD-10-CM | POA: Diagnosis present

## 2014-10-04 DIAGNOSIS — A6000 Herpesviral infection of urogenital system, unspecified: Secondary | ICD-10-CM | POA: Diagnosis present

## 2014-10-04 DIAGNOSIS — Z3A41 41 weeks gestation of pregnancy: Secondary | ICD-10-CM

## 2014-10-04 LAB — TYPE AND SCREEN
ABO/RH(D): A POS
Antibody Screen: NEGATIVE

## 2014-10-04 LAB — CBC
HEMATOCRIT: 32.2 % — AB (ref 36.0–46.0)
Hemoglobin: 11.4 g/dL — ABNORMAL LOW (ref 12.0–15.0)
MCH: 33.3 pg (ref 26.0–34.0)
MCHC: 35.4 g/dL (ref 30.0–36.0)
MCV: 94.2 fL (ref 78.0–100.0)
Platelets: 154 10*3/uL (ref 150–400)
RBC: 3.42 MIL/uL — ABNORMAL LOW (ref 3.87–5.11)
RDW: 13.6 % (ref 11.5–15.5)
WBC: 8.1 10*3/uL (ref 4.0–10.5)

## 2014-10-04 LAB — ABO/RH: ABO/RH(D): A POS

## 2014-10-04 MED ORDER — TETANUS-DIPHTH-ACELL PERTUSSIS 5-2.5-18.5 LF-MCG/0.5 IM SUSP
0.5000 mL | Freq: Once | INTRAMUSCULAR | Status: AC
  Administered 2014-10-05: 0.5 mL via INTRAMUSCULAR

## 2014-10-04 MED ORDER — PENICILLIN G POTASSIUM 5000000 UNITS IJ SOLR
2.5000 10*6.[IU] | INTRAMUSCULAR | Status: DC
  Administered 2014-10-04: 2.5 10*6.[IU] via INTRAVENOUS
  Filled 2014-10-04 (×4): qty 2.5

## 2014-10-04 MED ORDER — OXYCODONE-ACETAMINOPHEN 5-325 MG PO TABS: 2.0000 | ORAL_TABLET | ORAL | Status: DC | PRN

## 2014-10-04 MED ORDER — EPHEDRINE 5 MG/ML INJ
10.0000 mg | INTRAVENOUS | Status: DC | PRN
Start: 2014-10-04 — End: 2014-10-04
  Filled 2014-10-04: qty 2

## 2014-10-04 MED ORDER — WITCH HAZEL-GLYCERIN EX PADS: 1.0000 "application " | MEDICATED_PAD | CUTANEOUS | Status: DC | PRN

## 2014-10-04 MED ORDER — OXYCODONE-ACETAMINOPHEN 5-325 MG PO TABS: 1.0000 | ORAL_TABLET | ORAL | Status: DC | PRN

## 2014-10-04 MED ORDER — CITRIC ACID-SODIUM CITRATE 334-500 MG/5ML PO SOLN: 30.0000 mL | ORAL | Status: DC | PRN

## 2014-10-04 MED ORDER — PHENYLEPHRINE 40 MCG/ML (10ML) SYRINGE FOR IV PUSH (FOR BLOOD PRESSURE SUPPORT)
80.0000 ug | PREFILLED_SYRINGE | INTRAVENOUS | Status: DC | PRN
  Filled 2014-10-04: qty 2

## 2014-10-04 MED ORDER — PRENATAL MULTIVITAMIN CH
1.0000 | ORAL_TABLET | Freq: Every day | ORAL | Status: DC
  Administered 2014-10-05 – 2014-10-06 (×2): 1 via ORAL
  Filled 2014-10-04 (×2): qty 1

## 2014-10-04 MED ORDER — LIDOCAINE HCL (PF) 1 % IJ SOLN
INTRAMUSCULAR | Status: DC | PRN
  Administered 2014-10-04 (×2): 8 mL

## 2014-10-04 MED ORDER — ACETAMINOPHEN 325 MG PO TABS: 650.0000 mg | ORAL_TABLET | ORAL | Status: DC | PRN

## 2014-10-04 MED ORDER — OXYTOCIN BOLUS FROM INFUSION
500.0000 mL | INTRAVENOUS | Status: DC
  Administered 2014-10-04: 500 mL via INTRAVENOUS

## 2014-10-04 MED ORDER — FENTANYL 2.5 MCG/ML BUPIVACAINE 1/10 % EPIDURAL INFUSION (WH - ANES)
14.0000 mL/h | INTRAMUSCULAR | Status: DC | PRN
  Administered 2014-10-04: 14 mL/h via EPIDURAL

## 2014-10-04 MED ORDER — FENTANYL 2.5 MCG/ML BUPIVACAINE 1/10 % EPIDURAL INFUSION (WH - ANES)
INTRAMUSCULAR | Status: AC
  Filled 2014-10-04: qty 125

## 2014-10-04 MED ORDER — PENICILLIN G POTASSIUM 5000000 UNITS IJ SOLR
5.0000 10*6.[IU] | Freq: Once | INTRAVENOUS | Status: AC
  Administered 2014-10-04: 5 10*6.[IU] via INTRAVENOUS
  Filled 2014-10-04: qty 5

## 2014-10-04 MED ORDER — FENTANYL CITRATE (PF) 100 MCG/2ML IJ SOLN
100.0000 ug | Freq: Once | INTRAMUSCULAR | Status: AC
Start: 2014-10-04 — End: 2014-10-04
  Administered 2014-10-04: 100 ug via INTRAVENOUS
  Filled 2014-10-04: qty 2

## 2014-10-04 MED ORDER — LACTATED RINGERS IV SOLN
INTRAVENOUS | Status: DC
  Administered 2014-10-04 (×2): via INTRAVENOUS

## 2014-10-04 MED ORDER — BENZOCAINE-MENTHOL 20-0.5 % EX AERO
1.0000 | INHALATION_SPRAY | CUTANEOUS | Status: DC | PRN
Start: 2014-10-04 — End: 2014-10-06
  Administered 2014-10-05: 1 via TOPICAL
  Filled 2014-10-04: qty 56

## 2014-10-04 MED ORDER — ONDANSETRON HCL 4 MG/2ML IJ SOLN
4.0000 mg | INTRAMUSCULAR | Status: DC | PRN
Start: 2014-10-04 — End: 2014-10-06

## 2014-10-04 MED ORDER — PHENYLEPHRINE 40 MCG/ML (10ML) SYRINGE FOR IV PUSH (FOR BLOOD PRESSURE SUPPORT)
PREFILLED_SYRINGE | INTRAVENOUS | Status: AC
  Filled 2014-10-04: qty 20

## 2014-10-04 MED ORDER — LACTATED RINGERS IV SOLN: 500.0000 mL | INTRAVENOUS | Status: DC | PRN

## 2014-10-04 MED ORDER — ONDANSETRON HCL 4 MG PO TABS: 4.0000 mg | ORAL_TABLET | ORAL | Status: DC | PRN

## 2014-10-04 MED ORDER — DIPHENHYDRAMINE HCL 50 MG/ML IJ SOLN: 12.5000 mg | INTRAMUSCULAR | Status: DC | PRN

## 2014-10-04 MED ORDER — DIPHENHYDRAMINE HCL 25 MG PO CAPS: 25.0000 mg | ORAL_CAPSULE | Freq: Four times a day (QID) | ORAL | Status: DC | PRN

## 2014-10-04 MED ORDER — ONDANSETRON HCL 4 MG/2ML IJ SOLN: 4.0000 mg | Freq: Four times a day (QID) | INTRAMUSCULAR | Status: DC | PRN

## 2014-10-04 MED ORDER — LACTATED RINGERS IV SOLN
500.0000 mL | Freq: Once | INTRAVENOUS | Status: AC
  Administered 2014-10-04: 500 mL via INTRAVENOUS

## 2014-10-04 MED ORDER — IBUPROFEN 600 MG PO TABS
600.0000 mg | ORAL_TABLET | Freq: Four times a day (QID) | ORAL | Status: DC
  Administered 2014-10-04 – 2014-10-06 (×7): 600 mg via ORAL
  Filled 2014-10-04 (×7): qty 1

## 2014-10-04 MED ORDER — DIBUCAINE 1 % RE OINT: 1.0000 "application " | TOPICAL_OINTMENT | RECTAL | Status: DC | PRN

## 2014-10-04 MED ORDER — SENNOSIDES-DOCUSATE SODIUM 8.6-50 MG PO TABS
2.0000 | ORAL_TABLET | ORAL | Status: DC
  Administered 2014-10-04 – 2014-10-05 (×2): 2 via ORAL
  Filled 2014-10-04 (×2): qty 2

## 2014-10-04 MED ORDER — LIDOCAINE HCL (PF) 1 % IJ SOLN
30.0000 mL | INTRAMUSCULAR | Status: DC | PRN
  Filled 2014-10-04: qty 30

## 2014-10-04 MED ORDER — OXYTOCIN 40 UNITS IN LACTATED RINGERS INFUSION - SIMPLE MED
62.5000 mL/h | INTRAVENOUS | Status: DC
  Filled 2014-10-04: qty 1000

## 2014-10-04 MED ORDER — ZOLPIDEM TARTRATE 5 MG PO TABS: 5.0000 mg | ORAL_TABLET | Freq: Every evening | ORAL | Status: DC | PRN

## 2014-10-04 MED ORDER — LANOLIN HYDROUS EX OINT: TOPICAL_OINTMENT | CUTANEOUS | Status: DC | PRN

## 2014-10-04 MED ORDER — SIMETHICONE 80 MG PO CHEW: 80.0000 mg | CHEWABLE_TABLET | ORAL | Status: DC | PRN

## 2014-10-04 NOTE — MAU Note (Signed)
Pt states she has been having uc's all night, have become closer together.  Denies bleeding or LOF.

## 2014-10-04 NOTE — Progress Notes (Signed)
  Subjective: Pt reports comfortable after epidural.  No questions or concerns.    Objective: BP 116/62 mmHg  Pulse 82  Temp(Src) 98.4 F (36.9 C) (Oral)  Resp 20  Ht 5\' 10"  (1.778 m)  Wt 94.348 kg (208 lb)  BMI 29.84 kg/m2  SpO2 99%  LMP 12/22/2013      FHT:  FHR: 130's bpm, variability: moderate,  accelerations:  Present,  decelerations:  Absent UC:   irregular, every 2-5 minutes SVE:   Dilation: 7 Effacement (%): 100 Station: 0 Exam by:: Ace GinsL. Cresenzo, RN   Labs: Lab Results  Component Value Date   WBC 8.1 10/04/2014   HGB 11.4* 10/04/2014   HCT 32.2* 10/04/2014   MCV 94.2 10/04/2014   PLT 154 10/04/2014    Assessment / Plan: Spontaneous labor, progressing normally  Labor: Progressing normally Preeclampsia:  n/a Fetal Wellbeing:  Category I Pain Control:  Epidural I/D:  GBS pos Anticipated MOD:  NSVD  Rochele PagesKARIM, Jozlin Bently N 10/04/2014, 3:31 PM

## 2014-10-04 NOTE — Progress Notes (Signed)
Pt requests to have cervix checked.  FHR 130's, +accels.  Cervix - complete/0

## 2014-10-04 NOTE — H&P (Signed)
Stefanie [name] is a 29 y.o. female presenting for contractions that began last night.  Received prenatal care at [facility].  Pregnancy dated by LMP consistent with first trimester ultrasound.  OB history uncomplicated.  +medical history consists of history of abnormal pap smear and herpes simplex virus 2 infection with last outbreak over a year ago.  Maternal Medical History:  Reason for admission: Contractions.   Contractions: Onset was 6-12 hours ago.   Frequency: regular.   Perceived severity is strong.    Fetal activity: Perceived fetal activity is normal.   Last perceived fetal movement was within the past hour.    Prenatal Complications - Diabetes: none.    [facility]  FOB Stefanie [name], 3rd baby, married  Dating By LMP/1st trimester [place]  Pap Normal 4/14  GC/CT Initial:     -/-           36+wks:  -/-  Genetic Screen NT/IT: normal  CF screen declined  Anatomic [place] normal  Flu vaccine 10/29  Tdap Recommended ~ 28wks  Glucose Screen    71/111/48  GBS positive  Feed Preference bottle  Contraception Pill vs vasectomy  Circumcision [name]/a  Childbirth Classes  no   Pediatrician Stefanie [name]   OB History    Gravida Para Term Preterm AB TAB SAB Ectopic Multiple Living   Past Medical History  Diagnosis Date  . Abnormal Pap smear of cervix   . HSV-2 (herpes simplex virus 2) infection   . Hx of gonorrhea   . Herpes 10/12/2013   Past Surgical History  Procedure Laterality Date  . Colposcopy      colpo with biopsy  . Wisdom tooth extraction     Family History: family history includes Cancer in her maternal grandfather; Dementia in her maternal grandmother; Diabetes in her maternal uncle and paternal grandmother; Heart attack in her father and paternal grandfather; Heart disease in her father and paternal grandfather. Social History:  reports that she has been smoking Cigarettes.  She has a 2.5 pack-year smoking history. She has never used smokeless  tobacco. She reports that she does not drink alcohol or use illicit drugs.    Review of Systems  Eyes: Negative for blurred vision and double vision.  Gastrointestinal: Positive for abdominal pain. Negative for heartburn.  Neurological: Negative for headaches.  All other systems reviewed and are negative.   Dilation: 4.5 Effacement (%): 80 Station: -2 Exam by:: Stefanie German RN Blood pressure 122/71, pulse 78, temperature 98.4 F (36.9 C), temperature source Oral, resp. rate 18, last menstrual period 12/22/2013. Maternal Exam:  Uterine Assessment: Poor tracing; audibly every 3-4 minutes  Abdomen: Patient reports no abdominal tenderness. Estimated fetal weight is 7-7.5 lbs.   Fetal presentation: vertex  Introitus: Normal vulva. Vulva is negative for lesion.  Normal vagina.  Vagina is negative for ulcerations.  Vaginal discharge: mucusy.  Pelvis: adequate for delivery.      Fetal Exam Fetal Monitor Review: Baseline rate: 140's.  Variability: moderate (6-25 bpm).   Pattern: accelerations present and variable decelerations.    Fetal State Assessment: Category II - tracings are indeterminate.     Physical Exam  Constitutional: She is oriented to person, place, and time. She appears well-developed and well-nourished. No distress.  Appears uncomfortable  HENT:  Head: Normocephalic.  Neck: Normal range of motion. Neck supple.  Cardiovascular: Normal rate, regular rhythm and normal heart sounds.  Respiratory: Effort normal and breath sounds normal.  GI: Soft. There is no tenderness.  Genitourinary: There is no lesion on the right labia. There is no lesion on the left labia. Vulva exhibits no lesion. No bleeding in the vagina. Vaginal discharge: mucusy.  Neurological: She is alert and oriented to person, place, and time.  Skin: Skin is warm and dry.    Prenatal labs: ABO, Rh: A/POS/-- (09/09 1006) Antibody: NEG (01/14 0925) Rubella: 0.86 (09/09 1006) RPR: NON REAC (01/14  0925)  HBsAg: NEGATIVE (09/09 1006)  HIV: NONREACTIVE (01/14 0925)  GBS: Positive (03/21 0000)   Assessment/Plan: 28 y.o. G3P1011 at 181w6d IUP Active Labor HSV II - no lesions GBS Positive (03/21 0000)   Plan: Admit to [facility] Suit[facility] GBS prophylaxis Anticipate NSVD Pain meds - patient undecided  Stefanie EdelsonKARIM, Stefanie [name] 10/04/2014, 11:00 AM

## 2014-10-04 NOTE — Anesthesia Procedure Notes (Signed)
Epidural Patient location during procedure: OB Start time: 10/04/2014 2:14 PM End time: 10/04/2014 2:18 PM  Staffing Anesthesiologist: Leilani AbleHATCHETT, Genae Strine Performed by: anesthesiologist   Preanesthetic Checklist Completed: patient identified, surgical consent, pre-op evaluation, timeout performed, IV checked, risks and benefits discussed and monitors and equipment checked  Epidural Patient position: sitting Prep: site prepped and draped and DuraPrep Patient monitoring: continuous pulse ox and blood pressure Approach: midline Location: L3-L4 Injection technique: LOR air  Needle:  Needle type: Tuohy  Needle gauge: 17 G Needle length: 9 cm and 9 Needle insertion depth: 5 cm cm Catheter type: closed end flexible Catheter size: 19 Gauge Catheter at skin depth: 10 cm Test dose: negative and Other  Assessment Sensory level: T9 Events: blood not aspirated, injection not painful, no injection resistance, negative IV test and no paresthesia  Additional Notes Reason for block:procedure for pain

## 2014-10-04 NOTE — Lactation Note (Signed)
This note was copied from the chart of Girl Melene PlanKrystal Thacker. Lactation Consultation Note  P2, First time BF. Baby has bfx3 since birth. Taught mother hand expression.  Mother was able to express drops of colostrum. Discussed feeding cues.  Mom encouraged to feed baby 8-12 times/24 hours and with feeding cues.  Baby recently bf for 15 min.  Tenea RN assisted w/ latch.  Mom made aware of O/P services, breastfeeding support groups, community resources, and our phone # for post-discharge questions. Discussed cluster feeding and hand expressing before latching. Encouraged mother to call if she needs assistance.     Patient Name: Girl Melene PlanKrystal Thacker RUEAV'WToday's Date: 10/04/2014 Reason for consult: Initial assessment   Maternal Data Has patient been taught Hand Expression?: Yes Does the patient have breastfeeding experience prior to this delivery?: No  Feeding Feeding Type: Breast Fed Length of feed: 15 min  LATCH Score/Interventions Latch: Grasps breast easily, tongue down, lips flanged, rhythmical sucking. Intervention(s): Adjust position;Assist with latch;Breast massage;Breast compression  Audible Swallowing: A few with stimulation Intervention(s): Skin to skin  Type of Nipple: Everted at rest and after stimulation Intervention(s): Reverse pressure  Comfort (Breast/Nipple): Soft / non-tender     Hold (Positioning): Assistance needed to correctly position infant at breast and maintain latch. Intervention(s): Breastfeeding basics reviewed;Support Pillows;Position options;Skin to skin  LATCH Score: 8  Lactation Tools Discussed/Used     Consult Status Consult Status: Follow-up Date: 10/05/14 Follow-up type: In-patient    Dahlia ByesBerkelhammer, Tanasha Menees [facility] 10/04/2014, 10:41 PM

## 2014-10-04 NOTE — Anesthesia Preprocedure Evaluation (Signed)
Anesthesia Evaluation  Patient identified by MRN, date of birth, ID band Patient awake    Reviewed: Allergy & Precautions, H&P , NPO status , Patient's Chart, lab work & pertinent test results  Airway Mallampati: I TM Distance: >3 FB Neck ROM: full    Dental no notable dental hx.    Pulmonary Current Smoker,    Pulmonary exam normal       Cardiovascular negative cardio ROS      Neuro/Psych negative neurological ROS  negative psych ROS   GI/Hepatic negative GI ROS, Neg liver ROS,   Endo/Other  negative endocrine ROS  Renal/GU negative Renal ROS     Musculoskeletal   Abdominal Normal abdominal exam  (+)   Peds  Hematology negative hematology ROS (+)   Anesthesia Other Findings   Reproductive/Obstetrics (+) Pregnancy                           Anesthesia Physical Anesthesia Plan  ASA: II  Anesthesia Plan: Epidural   Post-op Pain Management:    Induction:   Airway Management Planned:   Additional Equipment:   Intra-op Plan:   Post-operative Plan:   Informed Consent: I have reviewed the patients History and Physical, chart, labs and discussed the procedure including the risks, benefits and alternatives for the proposed anesthesia with the patient or authorized representative who has indicated his/her understanding and acceptance.     Plan Discussed with:   Anesthesia Plan Comments:         Anesthesia Quick Evaluation  

## 2014-10-05 LAB — RPR: RPR Ser Ql: NONREACTIVE

## 2014-10-05 NOTE — Lactation Note (Addendum)
This note was copied from the chart of Stefanie [name]. Lactation Consultation Note Follow up visit.  Mom is considering formula and when questioned is not sure she can breastfeed when she returns to work.  Mom will have off for 6 weeks.  Mom plans to get a pump from insurance.  Discussed pumping plan for returning to work.  Discussed benefits of exclusively breastfeeding.  Mom became tearful and reports its her hormones.  FOB came to room and is not supportive of breastfeeding. Offered support.  Mom has bruised stripes on tip of nipples.  Encouraged mom to make sure baby has wide open mouth and deep latch.  Discussed positioning and hold. Report given to RN.  Mom to call for assist as needed.   Patient Name: Stefanie [name] ZOXWR'UToday's Date: 10/05/2014 Reason for consult: Follow-up assessment   Maternal Data Has patient been taught Hand Expression?: Yes  Feeding    LATCH Score/Interventions                Intervention(s): Breastfeeding basics reviewed     Lactation Tools Discussed/Used     Consult Status Consult Status: Follow-up Date: 10/06/14 Follow-up type: In-patient    Sherice Ijames, Arvella MerlesJana Lynn 10/05/2014, 9:17 PM

## 2014-10-05 NOTE — Progress Notes (Signed)
Post Partum Day 1 Subjective:  Stefanie [name] is a 29 y.o. [id number] 7771w6d s/p SVD.  No acute events overnight.  Pt denies problems with ambulating, voiding or po intake.  She denies nausea or vomiting.  Pain is well controlled.  She has had flatus. She has not had bowel movement.  Lochia Minimal.  Plan for birth control is oral contraceptives (estrogen/progesterone).  Method of Feeding: Breast and bottle  Objective: Blood pressure 126/73, pulse 82, temperature 98.2 F (36.8 C), temperature source Oral, resp. rate 18, height 5\' 10"  (1.778 m), weight 94.348 kg (208 lb), last menstrual period 12/22/2013, SpO2 97 %, breast and bottle feeding  Physical Exam:  General: alert, cooperative and no distress Lochia:normal flow Chest: CTAB, normal work of breathing Heart: RRR no m/r/g Abdomen: soft, nontender,  Uterine Fundus: firm, midline DVT Evaluation: No evidence of DVT seen on physical exam. Extremities: no edema   Recent Labs  10/04/14 1140  HGB 11.4*  HCT 32.2*    Assessment/Plan:  ASSESSMENT: Stefanie [name] is a 29 y.o. [id number] 8771w6d s/p SVD who is stable and doing well  Plan for discharge tomorrow and Breastfeeding   LOS: 1 day   Therese SarahKevin A Laqueisha Catalina 10/05/2014, 9:25 AM

## 2014-10-05 NOTE — Anesthesia Postprocedure Evaluation (Signed)
Anesthesia Post Note  Patient: Stefanie [name]  Procedure(s) Performed: * No procedures listed *  Anesthesia type: Epidural  Patient location: Mother/Baby  Post pain: Pain level controlled  Post assessment: Post-op Vital signs reviewed  Last Vitals:  Filed Vitals:   10/05/14 0544  BP: 126/73  Pulse: 82  Temp: 36.8 C  Resp: 18    Post vital signs: Reviewed  Level of consciousness: awake  Complications: No apparent anesthesia complications

## 2014-10-06 ENCOUNTER — Inpatient Hospital Stay (HOSPITAL_COMMUNITY): Admission: RE | Admit: 2014-10-06 | Payer: Commercial Indemnity | Source: Ambulatory Visit

## 2014-10-06 MED ORDER — IBUPROFEN 600 MG PO TABS: 600.0000 mg | ORAL_TABLET | Freq: Four times a day (QID) | ORAL | Status: DC

## 2014-10-06 MED ORDER — MEASLES, MUMPS & RUBELLA VAC ~~LOC~~ INJ
0.5000 mL | INJECTION | Freq: Once | SUBCUTANEOUS | Status: AC
  Administered 2014-10-06: 0.5 mL via SUBCUTANEOUS
  Filled 2014-10-06: qty 0.5

## 2014-10-06 NOTE — Discharge Summary (Signed)
Obstetric Discharge Summary Reason for Admission: onset of labor Prenatal Procedures: ultrasound Intrapartum Procedures: spontaneous vaginal delivery Postpartum Procedures: none Complications-Operative and Postpartum: none  Delivery Note At 7:21 PM a viable and healthy female was delivered via Vaginal, Spontaneous Delivery (Presentation: Right Occiput Anterior). APGAR: 9, 9; weight 8 lb 1.6 oz.  Placenta status: Intact, Spontaneous. Cord: 3 vessels with the following complications: None.   Anesthesia: Epidural  Episiotomy: None Lacerations: None Est. Blood Loss (mL): 300  Mom to postpartum. Baby to Couplet care / Skin to Skin.  Hospital Course:  Active Problems:   Active labor at term   Stefanie [name] is a 29 y.o. [id number] s/p SVD at 3315w6d.  Patient was admitted 4/19 for spontaneous onset of labor.  She has postpartum course that was uncomplicated including no problems with ambulating, PO intake, urination, pain, or bleeding. The pt feels ready to go home and  will be discharged with outpatient follow-up.   Today: No acute events overnight.  Pt denies problems with ambulating, voiding or po intake.  She denies nausea or vomiting.  Pain is well controlled.  She has had flatus. She has had bowel movement.  Lochia Minimal.  Plan for birth control is oral contraceptives (estrogen/progesterone).  Method of Feeding: breast and bottle  Physical Exam:  General: alert, cooperative and no distress Lochia: appropriate Uterine Fundus: firm, midline DVT Evaluation: No evidence of DVT seen on physical exam. No cords or calf tenderness. No significant calf/ankle edema.  H/H: Lab Results  Component Value Date/Time   HGB 11.4* 10/04/2014 11:40 AM   HCT 32.2* 10/04/2014 11:40 AM    Stefanie [name] is a 29 y.o. [id number] s/p SVD who is stable and doing well, ready to go home.  Discharge Diagnoses: Term Pregnancy-delivered  Discharge Information: Date: 10/06/2014 Activity:  pelvic rest Diet: routine  Medications: PNV and Ibuprofen Breast feeding:  Yes, and bottle Condition: stable Instructions: refer to handout Discharge to: home    Eyvonne MechanicKevin Applegate, Med Student 10/06/2014,7:09 AM   CNM attestation I have seen and examined this patient.   Stefanie [name] is a 29 y.o. [id number] s/p SVD.  Pain is well controlled.  Plan for birth control is vasectomy, with possibility of OCPs in the meantime. [facility]  Method of Feeding: bottle  PE:  BP 128/76 mmHg  Pulse 76  Temp(Src) 97.4 F (36.3 C) (Oral)  Resp 16  Ht 5\' 10"  (1.778 m)  Wt 94.348 kg (208 lb)  BMI 29.84 kg/m2  SpO2 97%  LMP 12/22/2013  Breastfeeding? Unknown Heart: RRR Lungs: nl effort Fundus firm   Recent Labs  10/04/14 1140  HGB 11.4*  HCT 32.2*     Plan: discharge today - postpartum care discussed - f/u clinic in 6 weeks for postpartum visit   SHAW, KIMBERLY, CNM 10:10 AM

## 2014-10-06 NOTE — Lactation Note (Signed)
This note was copied from the chart of Stefanie [name]. Lactation Consultation Note; This is mother's first infant to breastfeed. She has been having concerns that infant is not getting enough and supplemented with formula .  Mother paged for latch assist. Assist with football cradle hold. Infant sustained latch for 25 mins. Mother has crack on the Rt nipple. She was complaining of pain with latch. Infant sustained latch with good depth. Mother describes minimal pain at the beginning of the feeding. Mother taught proper latch . Mother was given comfort gels. She was also given a hand pump to firm nipple. [facility]. Lots of teaching with mother.  Patient Name: Stefanie [name] ZOXWR'UToday's Date: 10/06/2014 Reason for consult: Follow-up assessment   Maternal Data    Feeding Feeding Type: Breast Fed Length of feed: 25 min  LATCH Score/Interventions Latch: Grasps breast easily, tongue down, lips flanged, rhythmical sucking. Intervention(s): Assist with latch;Breast compression  Audible Swallowing: Spontaneous and intermittent  Type of Nipple: Everted at rest and after stimulation Intervention(s): Hand pump  Comfort (Breast/Nipple): Filling, red/small blisters or bruises, mild/mod discomfort  Problem noted: Cracked, bleeding, blisters, bruises Interventions  (Cracked/bleeding/bruising/blister): Expressed breast milk to nipple  Hold (Positioning): Assistance needed to correctly position infant at breast and maintain latch. Intervention(s): Support Pillows;Position options  LATCH Score: 8  Lactation Tools Discussed/Used     Consult Status Consult Status: Complete Date: 10/06/14 Follow-up type: In-patient    Stevan BornKendrick, Season [facility] 10/06/2014, 11:34 AM

## 2014-10-06 NOTE — Discharge Instructions (Signed)

## 2014-11-07 ENCOUNTER — Ambulatory Visit (INDEPENDENT_AMBULATORY_CARE_PROVIDER_SITE_OTHER): Payer: Managed Care, Other (non HMO) | Admitting: Advanced Practice Midwife

## 2014-11-07 ENCOUNTER — Encounter: Payer: Self-pay | Admitting: Advanced Practice Midwife

## 2014-11-07 DIAGNOSIS — Z3202 Encounter for pregnancy test, result negative: Secondary | ICD-10-CM

## 2014-11-07 LAB — POCT URINE PREGNANCY: PREG TEST UR: NEGATIVE

## 2014-11-07 MED ORDER — NORETHINDRONE 0.35 MG PO TABS: 1.0000 | ORAL_TABLET | Freq: Every day | ORAL | Status: DC

## 2014-11-07 NOTE — Progress Notes (Signed)
  Stefanie BoatmanKrystal L Thacker is a 29 y.o. who presents for a postpartum visit. She is 5 weeks postpartum following a spontaneous vaginal delivery. I have fully reviewed the prenatal and intrapartum course. The delivery was at 40.6 gestational weeks.  Anesthesia: epidural. Postpartum course has been uneventful. Baby's course has been uneventful. Baby is feeding by breast and bottle. Bleeding: no bleeding. Bowel function is normal. Bladder function is normal. Patient is not sexually active. Contraception method is none. Postpartum depression screening: negative.   Current outpatient prescriptions:  .  acetaminophen (TYLENOL) 325 MG tablet, Take 650 mg by mouth every 6 (six) hours as needed., Disp: , Rfl:  .  Prenatal Vit-Fe Fumarate-FA (MULTIVITAMIN-PRENATAL) 27-0.8 MG TABS tablet, Take 1 tablet by mouth daily at 12 noon., Disp: , Rfl:  .  ibuprofen (ADVIL,MOTRIN) 600 MG tablet, Take 1 tablet (600 mg total) by mouth every 6 (six) hours. (Patient not taking: Reported on 11/07/2014), Disp: 50 tablet, Rfl: 1  Review of Systems   Constitutional: Negative for fever and chills Eyes: Negative for visual disturbances Respiratory: Negative for shortness of breath, dyspnea Cardiovascular: Negative for chest pain or palpitations  Gastrointestinal: Negative for vomiting, diarrhea and constipation Genitourinary: Negative for dysuria and urgency Musculoskeletal: Negative for back pain, joint pain, myalgias  Neurological: Negative for dizziness and headaches   Objective:     Filed Vitals:   11/07/14 1005  BP: 108/74  Pulse: 84   General:  alert, cooperative and no distress   Breasts:  negative  Lungs: clear to auscultation bilaterally  Heart:  regular rate and rhythm  Abdomen: Soft, nontender   Vulva:  normal  Vagina: normal vagina  Cervix:  closed  Corpus: Well involuted     Rectal Exam: no hemorrhoids        Assessment:    normal postpartum exam.  Plan:    1. Contraception: oral  progesterone-only contraceptive  Back up for 1st pack 2. Follow up in:  as needed.

## 2014-11-12 ENCOUNTER — Encounter (HOSPITAL_COMMUNITY): Payer: Self-pay

## 2014-11-12 ENCOUNTER — Emergency Department (HOSPITAL_COMMUNITY)
Admission: EM | Admit: 2014-11-12 | Discharge: 2014-11-12 | Disposition: A | Discharge status: Home / Self Care | Payer: Managed Care, Other (non HMO) | Attending: Emergency Medicine | Admitting: Emergency Medicine

## 2014-11-12 ENCOUNTER — Emergency Department (HOSPITAL_COMMUNITY): Payer: Managed Care, Other (non HMO)

## 2014-11-12 DIAGNOSIS — R1011 Right upper quadrant pain: Secondary | ICD-10-CM

## 2014-11-12 DIAGNOSIS — Z8619 Personal history of other infectious and parasitic diseases: Secondary | ICD-10-CM | POA: Diagnosis not present

## 2014-11-12 DIAGNOSIS — K8020 Calculus of gallbladder without cholecystitis without obstruction: Secondary | ICD-10-CM | POA: Diagnosis not present

## 2014-11-12 DIAGNOSIS — K8050 Calculus of bile duct without cholangitis or cholecystitis without obstruction: Secondary | ICD-10-CM

## 2014-11-12 DIAGNOSIS — Z79899 Other long term (current) drug therapy: Secondary | ICD-10-CM | POA: Insufficient documentation

## 2014-11-12 DIAGNOSIS — Z720 Tobacco use: Secondary | ICD-10-CM | POA: Insufficient documentation

## 2014-11-12 DIAGNOSIS — Z3202 Encounter for pregnancy test, result negative: Secondary | ICD-10-CM | POA: Diagnosis not present

## 2014-11-12 LAB — COMPREHENSIVE METABOLIC PANEL
ALBUMIN: 3.9 g/dL (ref 3.5–5.0)
ALK PHOS: 71 U/L (ref 38–126)
ALT: 20 U/L (ref 14–54)
AST: 21 U/L (ref 15–41)
Anion gap: 10 (ref 5–15)
BUN: 20 mg/dL (ref 6–20)
CALCIUM: 9 mg/dL (ref 8.9–10.3)
CHLORIDE: 104 mmol/L (ref 101–111)
CO2: 25 mmol/L (ref 22–32)
Creatinine, Ser: 0.77 mg/dL (ref 0.44–1.00)
GFR calc Af Amer: >60 mL/min (ref >60)
Glucose, Bld: 107 mg/dL — ABNORMAL HIGH (ref 65–99)
Potassium: 3.9 mmol/L (ref 3.5–5.1)
Sodium: 139 mmol/L (ref 135–145)
TOTAL PROTEIN: 7.2 g/dL (ref 6.5–8.1)
Total Bilirubin: 0.5 mg/dL (ref 0.3–1.2)

## 2014-11-12 LAB — URINALYSIS, ROUTINE W REFLEX MICROSCOPIC
Bilirubin Urine: NEGATIVE
Glucose, UA: NEGATIVE mg/dL
Ketones, ur: NEGATIVE mg/dL
NITRITE: NEGATIVE
PROTEIN: NEGATIVE mg/dL
Urobilinogen, UA: 0.2 mg/dL (ref 0.0–1.0)
pH: 6 (ref 5.0–8.0)

## 2014-11-12 LAB — CBC WITH DIFFERENTIAL/PLATELET
BASOS PCT: 0 % (ref 0–1)
Basophils Absolute: 0 10*3/uL (ref 0.0–0.1)
Eosinophils Absolute: 0.1 10*3/uL (ref 0.0–0.7)
Eosinophils Relative: 1 % (ref 0–5)
HEMATOCRIT: 39.6 % (ref 36.0–46.0)
Hemoglobin: 13.4 g/dL (ref 12.0–15.0)
LYMPHS PCT: 18 % (ref 12–46)
Lymphs Abs: 1.5 10*3/uL (ref 0.7–4.0)
MCH: 32.4 pg (ref 26.0–34.0)
MCHC: 33.8 g/dL (ref 30.0–36.0)
MCV: 95.9 fL (ref 78.0–100.0)
MONO ABS: 0.5 10*3/uL (ref 0.1–1.0)
Monocytes Relative: 6 % (ref 3–12)
NEUTROS ABS: 6.2 10*3/uL (ref 1.7–7.7)
NEUTROS PCT: 75 % (ref 43–77)
Platelets: 178 10*3/uL (ref 150–400)
RBC: 4.13 MIL/uL (ref 3.87–5.11)
RDW: 11.6 % (ref 11.5–15.5)
WBC: 8.3 10*3/uL (ref 4.0–10.5)

## 2014-11-12 LAB — URINE MICROSCOPIC-ADD ON

## 2014-11-12 LAB — PREGNANCY, URINE: Preg Test, Ur: NEGATIVE

## 2014-11-12 LAB — LIPASE, BLOOD: Lipase: 29 U/L (ref 22–51)

## 2014-11-12 MED ORDER — HYDROCODONE-ACETAMINOPHEN 5-325 MG PO TABS: ORAL_TABLET | ORAL | Status: DC

## 2014-11-12 MED ORDER — SODIUM CHLORIDE 0.9 % IV BOLUS (SEPSIS)
1000.0000 mL | Freq: Once | INTRAVENOUS | Status: AC
  Administered 2014-11-12: 1000 mL via INTRAVENOUS

## 2014-11-12 MED ORDER — FENTANYL CITRATE (PF) 100 MCG/2ML IJ SOLN
50.0000 ug | Freq: Once | INTRAMUSCULAR | Status: AC
  Administered 2014-11-12: 50 ug via INTRAVENOUS
  Filled 2014-11-12: qty 2

## 2014-11-12 MED ORDER — ONDANSETRON HCL 4 MG PO TABS: 4.0000 mg | ORAL_TABLET | Freq: Three times a day (TID) | ORAL | Status: DC | PRN

## 2014-11-12 MED ORDER — ONDANSETRON HCL 4 MG/2ML IJ SOLN
4.0000 mg | Freq: Once | INTRAMUSCULAR | Status: AC
  Administered 2014-11-12: 4 mg via INTRAVENOUS
  Filled 2014-11-12: qty 2

## 2014-11-12 NOTE — ED Notes (Signed)
Patient states that she has been hurting in her right side with pain radiating around under her right breast. Patient states that it started two days ago. Patient had a baby on October 04, 2014.

## 2014-11-12 NOTE — Discharge Instructions (Signed)
°Emergency Department Resource Guide °1) Find a Doctor and Pay Out of Pocket °Although you won't have to find out who is covered by your insurance plan, it is a good idea to ask around and get recommendations. You will then need to call the office and see if the doctor you have chosen will accept you as a new patient and what types of options they offer for patients who are self-pay. Some doctors offer discounts or will set up payment plans for their patients who do not have insurance, but you will need to ask so you aren't surprised when you get to your appointment. ° °2) Contact Your Local Health Department °Not all health departments have doctors that can see patients for sick visits, but many do, so it is worth a call to see if yours does. If you don't know where your local health department is, you can check in your phone book. The CDC also has a tool to help you locate your state's health department, and many state websites also have listings of all of their local health departments. ° °3) Find a Walk-in Clinic °If your illness is not likely to be very severe or complicated, you may want to try a walk in clinic. These are popping up all over the country in pharmacies, drugstores, and shopping centers. They're usually staffed by nurse practitioners or physician assistants that have been trained to treat common illnesses and complaints. They're usually fairly quick and inexpensive. However, if you have serious medical issues or chronic medical problems, these are probably not your best option. ° °No Primary Care Doctor: °- Call [facility] at  832-8000 - they can help you locate a primary care doctor that  accepts your insurance, provides certain services, etc. °- Physician Referral Service- 1-800-533-3463 ° °Chronic Pain Problems: °Organization         Address  Phone   Notes  °[facility]  (336) 297-2271 Patients need to be referred by their primary care doctor.  ° °Medication  Assistance: °Organization         Address  Phone   Notes  °[place] [facility] [address] °[place], [place] [address] (336) 641-8030 --Must be a resident of [place] °-- Must have NO insurance coverage whatsoever (no Medicaid/ Medicare, etc.) °-- The pt. MUST have a primary care doctor that directs their care regularly and follows them in the community °  °[facility]  (866) 331-1348   °[employer]  (888) 892-1162   ° °Agencies that provide inexpensive medical care: °Organization         Address  Phone   Notes  °[facility]  (336) 832-8035   °[facility]    (336) 832-7272   °[facility] [address] °[place], [place] [address] (336) 832-4777   °[facility] [address], °[place] (336) 271-4999   °[facility]    (336) 373-0678   °[facility]    (336) 272-1050   °[facility] ° [address], [place] Phone:  (336) 832-4444, Fax:  (336) 832-4440 Hours of Operation:  9 am - 6 pm, M-F.  Also accepts Medicaid/Medicare and self-pay.  °[facility] ° [address], [place] Phone: (336) 832-3150, Fax: (336) 832-3151. Hours of Operation:  8:30 am - 5:30 pm, M-F.  Also accepts Medicaid and self-pay.  °[facility] [place] [address], [place] Phone: (336) 878-6027   °[facility] [address], [place], [place] (336)723-1848, Ext. 123 Mondays & Thursdays: 7-9 AM.  First 15 patients are seen on a first come, first serve basis. °  ° °Medicaid-accepting [place] Providers: ° °Organization         Address  Phone   Notes  °[facility] [address], [place] (336) 641-2100 Also accepts self-pay patients.  °[facility] [address], [place] ° (336) 856-9996   °[facility] [address], [place]  (336) 288-8857   °[facility] [address] [place] [address], [place] (336) 299-7000   °Veita Bland [address], [place]  ° (336) 373-1557 Only accepts [place] Access Medicaid patients after they have their name applied to their card.  ° °Self-Pay (no insurance) in [place]: ° °Organization         Address  Phone   Notes  °Sickle Cell Patients, [facility] [address], [place] (336) 832-1970   °[facility] [address], [place] (336) 832-4400   °[facility] ° [address], [place] (336) 992-4800   °[facility]/Dr. Osei-Bonsu ° [address] [place] [address], [place] or [address], [place] (336) 841-8500 Phone number for both [place] and [place] locations is the same.  °[facility] [address], [place] (336) 299-0000   °[facility] [address] [place] [address], [place] or [address] (336) 852-7530 °(336) 878-2260   °[facility] [address], [place] (336) 350-1642, phone; (336) 294-5005, fax Sees patients 1st and 3rd Saturday of every month.  Must not qualify for public or private insurance (i.e. Medicaid, Medicare, [place] Health Choice, Veterans' Benefits) • Household income should be no more than 200% of the poverty level •The clinic cannot treat you if you are pregnant or think you are pregnant • Sexually transmitted diseases are not treated at the clinic.  ° ° °Dental Care: °Organization         Address  Phone  Notes  °[place] [facility] [address], [place] (336) 641-6152 Accepts children up to age 21 who are enrolled in Medicaid or [place] Health Choice; pregnant women with a Medicaid card; and children who have applied for Medicaid or [place] Health Choice, but were declined, whose parents can pay a reduced fee at time of service.  °[place]  [facility] [place]  [address], [place] (336) 641-7733 Accepts children up to age 21 who are enrolled in Medicaid or [place] Health Choice; pregnant women with a Medicaid card; and children who have applied for Medicaid or [place] Health Choice, but were declined, whose parents can pay a reduced fee at time of service.  °[facility] ° [address], [place] (336) 641-4533 Patients are seen by appointment only. Walk-ins are not accepted. [facility] will see patients 18 years of age and older. °Monday - Tuesday (8am-5pm) °Most Wednesdays (8:30-5pm) °$30 per visit, cash only  °[facility] ° [address], [place] (336) 641-4533 Patients are seen by appointment only. Walk-ins are not accepted. [facility] will see patients 18 years of age and older. °One   Wednesday Evening (Monthly: Volunteer Based).  $30 per visit, cash only  °[facility]  (919) 537-3737 for adults; Children under age 4, call Graduate Pediatric Dentistry at ([phone]) [phone]. Children aged 4-14, please call (919) [phone] to request a pediatric application. ° Dental services are provided in all areas of dental care including fillings, crowns and bridges, complete and partial dentures, implants, gum treatment, root canals, and extractions. Preventive care is also provided. Treatment is provided to both adults and children. °Patients are selected via a lottery and there is often a waiting list. °  °[facility] [address], °[place] ° (336) 763-8833 [email or web address] °  °[facility] [address], [place], [place] (336)723-1848, Ext. 123 Second and Fourth Thursday of each month, opens at 6:30 AM; Clinic ends at 9 AM.  Patients are seen on a first-come first-served basis, and a limited number are seen during each clinic.  ° °[facility] ° [address], [place], [place] (336) 723-7904    Eligibility Requirements °You must have lived in [place], [place], or [place] counties for at least the last three months. °  You cannot be eligible for state or federal sponsored healthcare insurance, including Veterans Administration, Medicaid, or Medicare. °  You generally cannot be eligible for healthcare insurance through your employer.  °  How to apply: °Eligibility screenings are held every Tuesday and Wednesday afternoon from 1:00 pm until 4:00 pm. You do not need an appointment for the interview!  °[facility] [address], [place], [place] 336-631-2330   °[facility]  336-342-8273   °[facility]  336-703-3100   °[facility]  336-570-6415   ° °Behavioral Health Resources in the Community: °Intensive Outpatient Programs °Organization         Address  Phone  Notes  °[facility] [address], [place], [place] 336-878-6098   °[facility] [address], [place], [place] 336-832-9800   °[facility] [address], [place], [place] ° 336-882-2125   °[facility] [address],  °[place], [place] 1-800-853-5163 or 336-641-4981   °Substance Abuse Resources °Organization         Address  Phone  Notes  °[facility]  336-882-2125   °[facility]  336-784-9470   °[facility]  336-285-9073   °[facility]  336-845-3988   °Residential & Outpatient Substance Abuse Program  1-800-659-3381   °Psychological Services °Organization         Address  Phone  Notes  °[facility]  336- 832-9600   °[facility]  336- 378-7881   °[facility] [address], [place] 1-800-853-5163 or 336-641-4981   ° °Mobile Crisis Teams °Organization         Address  Phone  Notes  °[facility], Mobile Crisis Care Unit  1-877-626-1772   °Assertive °[facility] ° [address]  [place], [place] 336-834-9664   °Sharon DeEsch [address] °[place] 336-554-5454   ° °Self-Help/Support Groups °Organization         Address  Phone             Notes  °[facility] of  - variety of support groups  336- 373-1402 Call for more information  °Narcotics Anonymous (NA), [facility] [address], °[place]  2 meetings at this location  ° °  Residential Treatment Programs Organization         Address  Phone  Notes  [facility] [address],    [place]  [phone]   [facility]  [address], [place] [phone], [place], [place] [phone]   [facility] [address], [place] [phone] Admissions: 8am-3pm M-F  [facility] [address]    [place], [place] [phone]   [facility] [address], [place], [place] [phone]   [facility] [address]  [place], [place] [phone]   [facility] Int[facility]patient [address], [place], [place] [phone]   [facility] ([facility] [address]  [place], [place] [phone] or [phone]   [facility] ([facility]) [address] [place], [place] [phone] Accepts Medicaid  [facility] [address]  [place] [phone] Substance Abuse/Addiction Treatment   [facility]al [facility] Resources Organization         Address  Phone  Notes  [facility]  [phone](888) [phone]   Angie FavaJulie Brannon, PhD [address], [address] [place], [place]   [phone](336) (830)[phone] or [phone](336) [phone]   [facility]   [address] [place], [place] (931) [phone](336) ([phone]) [phone]   [facility] [address], [place], [place] [phone](336) [phone] Insurance/Medicaid/sponsorship through [facility]  [facility] [address]                                    [place], [place] [phone](336) [phone] Therapy/tele-psych/case    [facility] [address]   [place], [place] ([phone](336) [phone]    Dr. Lolly MustacheArfeen  [phone](336) [phone]   [facility] [place]  [facility] 1) [address], [place] 2) [address], [place] 3)  [address], [place] [phone](336) [phone](336) [phone]  [phone](336) [phone]   [facility] [facility] Child Abuse Hotline (307) 753-5815(336) 602-420-4468 or [phone](336) ([phone])[phone] (After Hours)      Eat a bland diet, avoiding greasy, fatty, fried foods, as well as spicy and acidic foods or beverages.  Avoid eating within the hour or 2 before going to bed or laying down.  Also avoid teas, colas, coffee, chocolate, pepermint and spearment. Take the prescriptions as directed.  Call the General Surgeon on Tuesday to schedule a follow up appointment this week.  Return to the Emergency Department immediately if worsening.

## 2014-11-12 NOTE — ED Notes (Signed)
Patient with no complaints at this time. Respirations even and unlabored. Skin warm/dry. Discharge instructions reviewed with patient at this time. Patient given opportunity to voice concerns/ask questions. IV removed per policy and band-aid applied to site. Patient discharged at this time and left Emergency Department with steady gait.  

## 2014-11-12 NOTE — ED Notes (Signed)
Patient to US

## 2014-11-12 NOTE — ED Provider Notes (Signed)
CSN: [id number]     Arrival date & time 11/12/14  5784 History   First MD Initiated Contact with Patient 11/12/14 0531     Chief Complaint  Patient presents with  . Flank Pain     (Consider location/radiation/quality/duration/timing/severity/associated sxs/prior Treatment) HPI  Patient delivered her second child on April 19. She reports 2 nights ago, May 26 she had right back pain radiating into her right upper quadrant from about 1 AM to 6 AM in the morning. She describes the pain as aching and not sharp. She had nausea without vomiting or diarrhea. She had some abdominal bloating. She states that night she had eaten a fried chicken sandwich. Last night they had hamburgers for dinner. She states her pain awoke her at 3 AM this morning in the same location and same description. She has never had this pain before the past night. Nothing she does makes the pain better, nothing she does makes it hurt more. She reports her sister has had her gallbladder removed.  PCP Dr Phillips Odor  Past Medical History  Diagnosis Date  . Abnormal Pap smear of cervix   . HSV-2 (herpes simplex virus 2) infection   . Hx of gonorrhea   . Herpes 10/12/2013   Past Surgical History  Procedure Laterality Date  . Colposcopy      colpo with biopsy  . Wisdom tooth extraction     Family History  Problem Relation Age of Onset  . Heart attack Father   . Heart disease Father   . Diabetes Maternal Uncle   . Dementia Maternal Grandmother   . Cancer Maternal Grandfather     throat, lung, bladder  . Diabetes Paternal Grandmother   . Heart attack Paternal Grandfather   . Heart disease Paternal Grandfather    History  Substance Use Topics  . Smoking status: Current Some Day Smoker -- 0.25 packs/day for 10 years    Types: Cigarettes  . Smokeless tobacco: Never Used  . Alcohol Use: No  employed, home on maternity leave presently   OB History    Gravida Para Term Preterm AB TAB SAB Ectopic Multiple Living   0 2     Review of Systems  All other systems reviewed and are negative.     Allergies  Review of patient's allergies indicates no known allergies.  Home Medications   Prior to Admission medications   Medication Sig Start Date End Date Taking? Authorizing Provider  acetaminophen (TYLENOL) 325 MG tablet Take 650 mg by mouth every 6 (six) hours as needed.   Yes Historical Provider, MD  ibuprofen (ADVIL,MOTRIN) 600 MG tablet Take 1 tablet (600 mg total) by mouth every 6 (six) hours. 10/06/14  Yes Arabella Merles, CNM  norethindrone (MICRONOR,CAMILA,ERRIN) 0.35 MG tablet Take 1 tablet (0.35 mg total) by mouth daily. 11/07/14  Yes Jacklyn Shell, CNM  Prenatal Vit-Fe Fumarate-FA (MULTIVITAMIN-PRENATAL) 27-0.8 MG TABS tablet Take 1 tablet by mouth daily at 12 noon.   Yes Historical Provider, MD   BP 118/91 mmHg  Pulse 81  Temp(Src) 97.5 F (36.4 C) (Oral)  Resp 16  Ht  (1.778 m)  Wt 175 lb (79.379 kg)  BMI 25.11 kg/m2  SpO2 100%  Breastfeeding? Yes  Vital signs normal   Physical Exam  Constitutional: She is oriented to person, place, and time. She appears well-developed and well-nourished.  Non-toxic appearance. She does not appear ill. No distress.  HENT:  Head: Normocephalic and  atraumatic.  Right Ear: External ear normal.  Left Ear: External ear normal.  Nose: Nose normal. No mucosal edema or rhinorrhea.  Mouth/Throat: Oropharynx is clear and moist and mucous membranes are normal. No dental abscesses or uvula swelling.  Eyes: Conjunctivae and EOM are normal. Pupils are equal, round, and reactive to light.  Neck: Normal range of motion and full passive range of motion without pain. Neck supple.  Cardiovascular: Normal rate, regular rhythm and normal heart sounds.  Exam reveals no gallop and no friction rub.   No murmur heard. Pulmonary/Chest: Effort normal and breath sounds normal. No respiratory distress. She has no wheezes. She has no rhonchi. She has no  rales. She exhibits no tenderness and no crepitus.  Abdominal: Soft. Normal appearance and bowel sounds are normal. She exhibits no distension. There is no tenderness. There is no rebound and no guarding.    Mildly tender epigastric area and in the right upper quadrant is shown.  Genitourinary:  No CVA tenderness on either side  Musculoskeletal: Normal range of motion. She exhibits no edema or tenderness.       Back:  Moves all extremities well. Area of back pain noted  Neurological: She is alert and oriented to person, place, and time. She has normal strength. No cranial nerve deficit.  Skin: Skin is warm, dry and intact. No rash noted. No erythema. No pallor.  Psychiatric: She has a normal mood and affect. Her speech is normal and behavior is normal. Her mood appears not anxious.  Nursing note and vitals reviewed.   ED Course  Procedures (including critical care time)  Medications  sodium chloride 0.9 % bolus 1,000 mL (1,000 mLs Intravenous New Bag/Given 11/12/14 0613)  fentaNYL (SUBLIMAZE) injection 50 mcg (50 mcg Intravenous Given 11/12/14 0607)  ondansetron (ZOFRAN) injection 4 mg (4 mg Intravenous Given 11/12/14 0607)   Patient given IV pain and nausea medication.   Recheck 06:55 pain is better, not gone. Does not want any more pain medications. Waiting to get her US done and labs to result.   PT rechecked at 7:30, given her test results, again her pain is not gone, but doesn't want more pain medication.   Pt turned over to Dr Clarene Duke at change of shift to get results of her [place] RUQ.    Labs Review Results for orders placed or performed during the hospital encounter of 11/12/14  Comprehensive metabolic panel  Result Value Ref Range   Sodium 139 135 - 145 mmol/L   Potassium 3.9 3.5 - 5.1 mmol/L   Chloride 104 101 - 111 mmol/L   CO2 25 22 - 32 mmol/L   Glucose, Bld 107 (H) 65 - 99 mg/dL   BUN 20 6 - 20 mg/dL   Creatinine, Ser 1.61 0.44 - 1.00 mg/dL   Calcium 9.0 8.9 -  09.6 mg/dL   Total Protein 7.2 6.5 - 8.1 g/dL   Albumin 3.9 3.5 - 5.0 g/dL   AST 21 15 - 41 U/L   ALT 20 14 - 54 U/L   Alkaline Phosphatase 71 38 - 126 U/L   Total Bilirubin 0.5 0.3 - 1.2 mg/dL   GFR calc non Af Amer >60 >60 mL/min   GFR calc Af Amer >60 >60 mL/min   Anion gap 10 5 - 15  Lipase, blood  Result Value Ref Range   Lipase 29 22 - 51 U/L  CBC with Differential  Result Value Ref Range   WBC 8.3 4.0 - 10.5 K/uL  RBC 4.13 3.87 - 5.11 MIL/uL   Hemoglobin 13.4 12.0 - 15.0 g/dL   HCT 16.139.6 09.636.0 - 04.546.0 %   MCV 95.9 78.0 - 100.0 fL   MCH 32.4 26.0 - 34.0 pg   MCHC 33.8 30.0 - 36.0 g/dL   RDW 40.911.6 81.111.5 - 91.415.5 %   Platelets 178 150 - 400 K/uL   Neutrophils Relative % 75 43 - 77 %   Neutro Abs 6.2 1.7 - 7.7 K/uL   Lymphocytes Relative 18 12 - 46 %   Lymphs Abs 1.5 0.7 - 4.0 K/uL   Monocytes Relative 6 3 - 12 %   Monocytes Absolute 0.5 0.1 - 1.0 K/uL   Eosinophils Relative 1 0 - 5 %   Eosinophils Absolute 0.1 0.0 - 0.7 K/uL   Basophils Relative 0 0 - 1 %   Basophils Absolute 0.0 0.0 - 0.1 K/uL  Urinalysis, Routine w reflex microscopic  Result Value Ref Range   Color, Urine YELLOW YELLOW   APPearance CLOUDY (A) CLEAR   Specific Gravity, Urine >1.030 (H) 1.005 - 1.030   pH 6.0 5.0 - 8.0   Glucose, UA NEGATIVE NEGATIVE mg/dL   Hgb urine dipstick LARGE (A) NEGATIVE   Bilirubin Urine NEGATIVE NEGATIVE   Ketones, ur NEGATIVE NEGATIVE mg/dL   Protein, ur NEGATIVE NEGATIVE mg/dL   Urobilinogen, UA 0.2 0.0 - 1.0 mg/dL   Nitrite NEGATIVE NEGATIVE   Leukocytes, UA MODERATE (A) NEGATIVE  Pregnancy, urine  Result Value Ref Range   Preg Test, Ur NEGATIVE NEGATIVE  Urine microscopic-add on  Result Value Ref Range   Squamous Epithelial / LPF MANY (A) RARE   WBC, UA 11-20 <3 WBC/hpf   RBC / HPF 3-6 <3 RBC/hpf   Bacteria, UA MANY (A) RARE   Laboratory interpretation all normal concentrated UA, contaminated UA (delivered 5 weeks ago)     Imaging Review No results found.    US limited RUQ pending.   EKG Interpretation None      MDM   Final diagnoses:  RUQ pain    Disposition pending per Dr Nelva NayMcManus   Stefanie Lamb, MD, Concha PyoFACEP     Dominyk Law, MD 11/12/14 [phone]

## 2014-11-12 NOTE — ED Provider Notes (Signed)
Pt received at sign out. Pt c/o RUQ pain, "aching" and "bloating," worsens with food intake (hamburger, fried chicken). Has been assoc with nausea. Pt feels improved after pain meds. Has tol PO well without N/V while in the ED. Labs reassuring. No clear UTI on Udip (appears contaminated) and pt is without urinary sx; will obtain UC. US as below.  T/C to General Surgery Dr. Lovell SheehanJenkins, case discussed, including:  HPI, pertinent PM/SHx, VS/PE, dx testing, ED course and treatment:  Agrees no acute surgical issue at this time, pt will need HIDA scan, have pt call office for f/u. Dx and testing d/w pt and family.  Questions answered.  Verb understanding, agreeable to d/c home with outpt f/u.    [place] Abdomen Limited Ruq 11/12/2014   CLINICAL DATA:  Right upper quadrant abdominal pain.  EXAM: US ABDOMEN LIMITED - RIGHT UPPER QUADRANT  COMPARISON:  None.  FINDINGS: Gallbladder:  Gallbladder is mildly distended but contains no calculi or biliary sludge. There is suggestion of gallbladder wall edema with maximal wall thickness of approximately 4 mm. This is borderline, but may reflect a component of chronic cholecystitis. No surrounding fluid is seen. No sonographic Murphy's sign was elicited during the examination.  Common bile duct:  Diameter: Normal caliber of 2 mm.  Liver:  No focal lesion identified. Within normal limits in parenchymal echogenicity. No biliary dilatation. The portal vein is open.  IMPRESSION: Distended gallbladder with suggestion of gallbladder wall edema and mild gallbladder wall thickening. This may reflect chronic cholecystitis. No gallstones identified. No sonographic Murphy's sign was elicited during the examination.   Electronically Signed   By: Irish LackGlenn  Yamagata M.D.   On: 11/12/2014 09:39     Samuel JesterKathleen Allene Furuya, DO 11/12/14 1047

## 2014-11-16 LAB — URINE CULTURE: Colony Count: 50000

## 2014-11-17 ENCOUNTER — Telehealth (HOSPITAL_BASED_OUTPATIENT_CLINIC_OR_DEPARTMENT_OTHER): Payer: Self-pay | Admitting: Emergency Medicine

## 2014-11-17 NOTE — Telephone Encounter (Signed)
Post ED Visit - Positive Culture Follow-up  Culture report reviewed by antimicrobial stewardship pharmacist: []  Wes Dulaney, Pharm.D., BCPS []  Celedonio MiyamotoJeremy Frens, Pharm.D., BCPS [x]  Georgina PillionElizabeth Martin, Pharm.D., BCPS []  MitchellMinh Pham, [address]Ph[address]D., BCPS, AAHIVP []  Estella HuskMichelle Turner, Pharm.D., BCPS, AAHIVP []  Elder CyphersLorie Poole, [address]Pharm[address]D., BCPS  Positive urine culture Staph, Enterococcus Treated with none, asymptomatic,and no further patient follow-up is required at this time.  Berle MullMiller, Jacek Colson 11/17/2014, 1:18 PM

## 2014-11-17 NOTE — Progress Notes (Signed)
ED Antimicrobial Stewardship Positive Culture Follow Up   Stefanie [name] is an 29 y.o. female who [facility] with a chief complaint of  Chief Complaint  Patient presents with  . Flank Pain    Recent Results (from the past 720 hour(s))  Urine culture     Status: None   Collection Time: 11/12/14  6:34 AM  Result Value Ref Range Status   Specimen Description URINE, CLEAN CATCH  Final   Special Requests NONE  Final   Colony Count   Final    50,000 COLONIES/ML Performed at [facility]    Culture   Final    ENTEROCOCCUS SPECIES STAPHYLOCOCCUS SPECIES (COAGULASE NEGATIVE) Note: RIFAMPIN AND GENTAMICIN SHOULD NOT BE USED AS SINGLE DRUGS FOR TREATMENT OF STAPH INFECTIONS. Performed at [facility]    Report Status 11/16/2014 FINAL  Final   Organism ID, Bacteria ENTEROCOCCUS SPECIES  Final   Organism ID, Bacteria STAPHYLOCOCCUS SPECIES (COAGULASE NEGATIVE)  Final      Susceptibility   Enterococcus species - MIC*    AMPICILLIN <=2 SENSITIVE Sensitive     LEVOFLOXACIN 1 SENSITIVE Sensitive     NITROFURANTOIN <=16 SENSITIVE Sensitive     VANCOMYCIN 2 SENSITIVE Sensitive     TETRACYCLINE >=16 RESISTANT Resistant     * ENTEROCOCCUS SPECIES   Staphylococcus species (coagulase negative) - MIC*    GENTAMICIN <=0.5 SENSITIVE Sensitive     LEVOFLOXACIN <=0.12 SENSITIVE Sensitive     NITROFURANTOIN <=16 SENSITIVE Sensitive     OXACILLIN <=0.25 SENSITIVE Sensitive     PENICILLIN >=0.5 RESISTANT Resistant     RIFAMPIN <=0.5 SENSITIVE Sensitive     TRIMETH/SULFA <=10 SENSITIVE Sensitive     VANCOMYCIN 1 SENSITIVE Sensitive     TETRACYCLINE 2 SENSITIVE Sensitive     * STAPHYLOCOCCUS SPECIES (COAGULASE NEGATIVE)    [x]  No treatment needed  29 YOF with R-sided abdominal pain/aching/bloating that radiated under R-breast that was worsened with food intake. Work-up showed chronic cholecystitis - patient to get outpatient follow-up.  UA noted to be  contaminated with many squamous cells, UCx only grew 50k of CoNS and enterococcus - likely indicative of contaminant - would not treat.   New antibiotic prescription: No treatment needed  ED Provider: Elson AreasLeslie K Sofia, PA-C  Rolley SimsMartin, Stefanie [name] 11/17/2014, 11:20 AM Infectious Diseases Pharmacist Phone# [phone]

## 2014-12-05 ENCOUNTER — Telehealth: Payer: Self-pay | Admitting: Advanced Practice Midwife

## 2014-12-06 NOTE — Telephone Encounter (Signed)
Pt requesting a return to work note for 12/08/2014. Pt had a postpartum visit with Cathie Beams, CNM on 11/07/2014. 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

## 2014-12-25 ENCOUNTER — Encounter (HOSPITAL_COMMUNITY): Payer: Self-pay | Admitting: Emergency Medicine

## 2014-12-25 ENCOUNTER — Emergency Department (HOSPITAL_COMMUNITY): Payer: Managed Care, Other (non HMO)

## 2014-12-25 ENCOUNTER — Emergency Department (HOSPITAL_COMMUNITY)
Admission: EM | Admit: 2014-12-25 | Discharge: 2014-12-25 | Disposition: A | Discharge status: Home / Self Care | Payer: Managed Care, Other (non HMO) | Attending: Emergency Medicine | Admitting: Emergency Medicine

## 2014-12-25 DIAGNOSIS — Z79899 Other long term (current) drug therapy: Secondary | ICD-10-CM | POA: Insufficient documentation

## 2014-12-25 DIAGNOSIS — Z3202 Encounter for pregnancy test, result negative: Secondary | ICD-10-CM | POA: Insufficient documentation

## 2014-12-25 DIAGNOSIS — R1011 Right upper quadrant pain: Secondary | ICD-10-CM | POA: Diagnosis present

## 2014-12-25 DIAGNOSIS — Z8619 Personal history of other infectious and parasitic diseases: Secondary | ICD-10-CM | POA: Insufficient documentation

## 2014-12-25 DIAGNOSIS — Z793 Long term (current) use of hormonal contraceptives: Secondary | ICD-10-CM | POA: Insufficient documentation

## 2014-12-25 DIAGNOSIS — K8050 Calculus of bile duct without cholangitis or cholecystitis without obstruction: Secondary | ICD-10-CM | POA: Diagnosis not present

## 2014-12-25 DIAGNOSIS — Z720 Tobacco use: Secondary | ICD-10-CM | POA: Diagnosis not present

## 2014-12-25 LAB — COMPREHENSIVE METABOLIC PANEL
ALK PHOS: 60 U/L (ref 38–126)
ALT: 17 U/L (ref 14–54)
AST: 16 U/L (ref 15–41)
Albumin: 3.8 g/dL (ref 3.5–5.0)
Anion gap: 6 (ref 5–15)
BILIRUBIN TOTAL: 0.4 mg/dL (ref 0.3–1.2)
BUN: 20 mg/dL (ref 6–20)
CALCIUM: 8.6 mg/dL — AB (ref 8.9–10.3)
CO2: 27 mmol/L (ref 22–32)
Chloride: 106 mmol/L (ref 101–111)
Creatinine, Ser: 0.77 mg/dL (ref 0.44–1.00)
GFR calc Af Amer: >60 mL/min (ref >60)
GFR calc non Af Amer: >60 mL/min (ref >60)
Glucose, Bld: 100 mg/dL — ABNORMAL HIGH (ref 65–99)
Potassium: 3.8 mmol/L (ref 3.5–5.1)
Sodium: 139 mmol/L (ref 135–145)
Total Protein: 6.8 g/dL (ref 6.5–8.1)

## 2014-12-25 LAB — CBC WITH DIFFERENTIAL/PLATELET
Basophils Absolute: 0 10*3/uL (ref 0.0–0.1)
Basophils Relative: 0 % (ref 0–1)
EOS ABS: 0 10*3/uL (ref 0.0–0.7)
Eosinophils Relative: 1 % (ref 0–5)
HCT: 38.1 % (ref 36.0–46.0)
Hemoglobin: 12.9 g/dL (ref 12.0–15.0)
Lymphocytes Relative: 18 % (ref 12–46)
Lymphs Abs: 1.5 10*3/uL (ref 0.7–4.0)
MCH: 32.2 pg (ref 26.0–34.0)
MCHC: 33.9 g/dL (ref 30.0–36.0)
MCV: 95 fL (ref 78.0–100.0)
MONOS PCT: 6 % (ref 3–12)
Monocytes Absolute: 0.5 10*3/uL (ref 0.1–1.0)
NEUTROS PCT: 75 % (ref 43–77)
Neutro Abs: 6.3 10*3/uL (ref 1.7–7.7)
PLATELETS: 195 10*3/uL (ref 150–400)
RBC: 4.01 MIL/uL (ref 3.87–5.11)
RDW: 12 % (ref 11.5–15.5)
WBC: 8.3 10*3/uL (ref 4.0–10.5)

## 2014-12-25 LAB — URINALYSIS, ROUTINE W REFLEX MICROSCOPIC
Bilirubin Urine: NEGATIVE
Glucose, UA: NEGATIVE mg/dL
HGB URINE DIPSTICK: NEGATIVE
Ketones, ur: NEGATIVE mg/dL
LEUKOCYTES UA: NEGATIVE
NITRITE: NEGATIVE
PH: 7 (ref 5.0–8.0)
PROTEIN: NEGATIVE mg/dL
SPECIFIC GRAVITY, URINE: 1.02 (ref 1.005–1.030)
Urobilinogen, UA: 0.2 mg/dL (ref 0.0–1.0)

## 2014-12-25 LAB — PREGNANCY, URINE: Preg Test, Ur: NEGATIVE

## 2014-12-25 LAB — LIPASE, BLOOD: LIPASE: 19 U/L — AB (ref 22–51)

## 2014-12-25 MED ORDER — ONDANSETRON 4 MG PO TBDP
4.0000 mg | ORAL_TABLET | Freq: Three times a day (TID) | ORAL | Status: DC | PRN
Start: 2014-12-25 — End: 2015-06-23

## 2014-12-25 MED ORDER — ONDANSETRON HCL 4 MG/2ML IJ SOLN
4.0000 mg | Freq: Once | INTRAMUSCULAR | Status: AC
  Administered 2014-12-25: 4 mg via INTRAVENOUS
  Filled 2014-12-25: qty 2

## 2014-12-25 MED ORDER — SODIUM CHLORIDE 0.9 % IV SOLN: INTRAVENOUS | Status: DC

## 2014-12-25 MED ORDER — SODIUM CHLORIDE 0.9 % IV BOLUS (SEPSIS)
1000.0000 mL | Freq: Once | INTRAVENOUS | Status: AC
  Administered 2014-12-25: 1000 mL via INTRAVENOUS

## 2014-12-25 MED ORDER — FENTANYL CITRATE (PF) 100 MCG/2ML IJ SOLN
50.0000 ug | Freq: Once | INTRAMUSCULAR | Status: AC
  Administered 2014-12-25: 50 ug via INTRAVENOUS
  Filled 2014-12-25: qty 2

## 2014-12-25 NOTE — ED Provider Notes (Signed)
CSN: [id number]     Arrival date & time 12/25/14  [id number] History  This chart was scribed for Vanetta MuldersScott Beaulah Romanek, MD by Elon SpannerGarrett Cook, ED Scribe. This patient was seen in room APA08/APA08 and the patient's care was started at 9:28 AM.   Chief Complaint  Patient presents with  . Abdominal Pain   The history is provided by the patient. No language interpreter was used.   HPI Comments: Stefanie [name] is a 29 y.o. female who presents to the Emergency Department complaining of constant 8/10 right CVA pain radaiating around to RUQ onset 5:45 am today.  She reports 1 episode of associated emesis and nausea.  She has had 5 total episodes of pain since 5/27 including today's complaint.  She was seen for a similar episode in May.  At that time she had a US with the following findings "Distended gallbladder with suggestion of gallbladder wall edema and mild gallbladder wall thickening. This may reflect chronic cholecystitis. No gallstones identified. No sonographic Murphy's sign was elicited during the examination." After this, she was seen by Dr. Lovell SheehanJenkins who did not advise cholecystectomy at the time.  She has not had a HIDA scan.  She does not have a history of gallstones.  She denies fever, dysuria.  Patient had a vaginal delivery 4/19.    Past Medical History  Diagnosis Date  . Abnormal Pap smear of cervix   . HSV-2 (herpes simplex virus 2) infection   . Hx of gonorrhea   . Herpes 10/12/2013   Past Surgical History  Procedure Laterality Date  . Colposcopy      colpo with biopsy  . Wisdom tooth extraction     Family History  Problem Relation Age of Onset  . Heart attack Father   . Heart disease Father   . Diabetes Maternal Uncle   . Dementia Maternal Grandmother   . Cancer Maternal Grandfather     throat, lung, bladder  . Diabetes Paternal Grandmother   . Heart attack Paternal Grandfather   . Heart disease Paternal Grandfather    History  Substance Use Topics  . Smoking status: Current Some  Day Smoker -- 0.25 packs/day for 10 years    Types: Cigarettes  . Smokeless tobacco: Never Used  . Alcohol Use: No   OB History    Gravida Para Term Preterm AB TAB SAB Ectopic Multiple Living   3 2 2  1  1   0 2     Review of Systems  Constitutional: Negative for fever and chills.  HENT: Negative for rhinorrhea and sore throat.   Eyes: Negative for visual disturbance.  Respiratory: Negative for cough and shortness of breath.   Cardiovascular: Negative for chest pain and leg swelling.  Gastrointestinal: Positive for nausea, vomiting and abdominal pain. Negative for diarrhea.  Genitourinary: Negative for dysuria.  Musculoskeletal: Positive for back pain. Negative for neck pain.  Skin: Negative for rash.  Neurological: Negative for headaches.  Hematological: Does not bruise/bleed easily.  Psychiatric/Behavioral: Negative for confusion.      Allergies  Review of patient's allergies indicates no known allergies.  Home Medications   Prior to Admission medications   Medication Sig Start Date End Date Taking? Authorizing Provider  acetaminophen (TYLENOL) 325 MG tablet Take 650 mg by mouth every 6 (six) hours as needed for mild pain.    Yes Historical Provider, MD  acidophilus (RISAQUAD) CAPS capsule Take 1 capsule by mouth daily.   Yes Historical Provider, MD  ibuprofen (ADVIL,MOTRIN) 600 MG  tablet Take 1 tablet (600 mg total) by mouth every 6 (six) hours. Patient taking differently: Take 600 mg by mouth every 6 (six) hours as needed for moderate pain.  10/06/14  Yes Arabella Merles, CNM  Prenatal Vit-Fe Fumarate-FA (MULTIVITAMIN-PRENATAL) 27-0.8 MG TABS tablet Take 1 tablet by mouth daily.    Yes Historical Provider, MD  valACYclovir (VALTREX) 500 MG tablet Take 500 mg by mouth daily as needed (outbreak).  09/21/14  Yes Historical Provider, MD  HYDROcodone-acetaminophen (NORCO/VICODIN) 5-325 MG per tablet 1 or 2 tabs PO q6 hours prn pain Patient not taking: Reported on 12/25/2014 11/12/14    Samuel Jester, DO  norethindrone (MICRONOR,CAMILA,ERRIN) 0.35 MG tablet Take 1 tablet (0.35 mg total) by mouth daily. Patient not taking: Reported on 11/12/2014 11/07/14   Jacklyn Shell, CNM  ondansetron (ZOFRAN ODT) 4 MG disintegrating tablet Take 1 tablet (4 mg total) by mouth every 8 (eight) hours as needed. 12/25/14   Vanetta Mulders, MD  ondansetron (ZOFRAN) 4 MG tablet Take 1 tablet (4 mg total) by mouth every 8 (eight) hours as needed for nausea or vomiting. Patient not taking: Reported on 12/25/2014 11/12/14   Samuel Jester, DO   BP 105/78 mmHg  Pulse 69  Temp(Src) 97.8 F (36.6 C) (Oral)  Resp 18  Ht  (1.753 m)  Wt 177 lb (80.287 kg)  BMI 26.13 kg/m2  SpO2 98%  LMP 12/07/2014  Breastfeeding? Yes Physical Exam  Constitutional: She is oriented to person, place, and time. She appears well-developed and well-nourished. No distress.  HENT:  Head: Normocephalic and atraumatic.  Mouth/Throat: Oropharynx is clear and moist.  Eyes: Conjunctivae and EOM are normal.  Sclera clear.  Eyes tracking normal.  Pupils normal  Neck: Neck supple. No tracheal deviation present.  Cardiovascular: Normal rate and regular rhythm.   Pulmonary/Chest: Effort normal and breath sounds normal. No respiratory distress.  Lungs CTA bilaterally.   Abdominal: Bowel sounds are normal. There is tenderness.  No tenderness on left or RLQ.  Tender over gallbladder in RUQ.   Musculoskeletal: Normal range of motion. She exhibits no edema.  No swelling in the legs.   Neurological: She is alert and oriented to person, place, and time. No cranial nerve deficit. She exhibits normal muscle tone. Coordination normal.  Skin: Skin is warm and dry.  Psychiatric: She has a normal mood and affect. Her behavior is normal.  Nursing note and vitals reviewed.   ED Course  Procedures (including critical care time)  DIAGNOSTIC STUDIES: Oxygen Saturation is 100% on RA, normal by my interpretation.     COORDINATION OF CARE:  9:37 AM Will order [place] and give IV fluids.  Patient acknowledges and agrees with plan.    Labs Review Labs Reviewed  LIPASE, BLOOD - Abnormal; Notable for the following:    Lipase 19 (*)    All other components within normal limits  COMPREHENSIVE METABOLIC PANEL - Abnormal; Notable for the following:    Glucose, Bld 100 (*)    Calcium 8.6 (*)    All other components within normal limits  URINALYSIS, ROUTINE W REFLEX MICROSCOPIC (NOT AT [facility]) - Abnormal; Notable for the following:    APPearance HAZY (*)    All other components within normal limits  CBC WITH DIFFERENTIAL/PLATELET  PREGNANCY, URINE   Results for orders placed or performed during the hospital encounter of 12/25/14  Lipase, blood  Result Value Ref Range   Lipase 19 (L) 22 - 51 U/L  Comprehensive metabolic panel  Result  Value Ref Range   Sodium 139 135 - 145 mmol/L   Potassium 3.8 3.5 - 5.1 mmol/L   Chloride 106 101 - 111 mmol/L   CO2 27 22 - 32 mmol/L   Glucose, Bld 100 (H) 65 - 99 mg/dL   BUN 20 6 - 20 mg/dL   Creatinine, Ser 4.09 0.44 - 1.00 mg/dL   Calcium 8.6 (L) 8.9 - 10.3 mg/dL   Total Protein 6.8 6.5 - 8.1 g/dL   Albumin 3.8 3.5 - 5.0 g/dL   AST 16 15 - 41 U/L   ALT 17 14 - 54 U/L   Alkaline Phosphatase 60 38 - 126 U/L   Total Bilirubin 0.4 0.3 - 1.2 mg/dL   GFR calc non Af Amer >60 >60 mL/min   GFR calc Af Amer >60 >60 mL/min   Anion gap 6 5 - 15  CBC with Differential/Platelet  Result Value Ref Range   WBC 8.3 4.0 - 10.5 K/uL   RBC 4.01 3.87 - 5.11 MIL/uL   Hemoglobin 12.9 12.0 - 15.0 g/dL   HCT 81.1 91.4 - 78.2 %   MCV 95.0 78.0 - 100.0 fL   MCH 32.2 26.0 - 34.0 pg   MCHC 33.9 30.0 - 36.0 g/dL   RDW 95.6 21.3 - 08.6 %   Platelets 195 150 - 400 K/uL   Neutrophils Relative % 75 43 - 77 %   Neutro Abs 6.3 1.7 - 7.7 K/uL   Lymphocytes Relative 18 12 - 46 %   Lymphs Abs 1.5 0.7 - 4.0 K/uL   Monocytes Relative 6 3 - 12 %   Monocytes Absolute 0.5 0.1 - 1.0 K/uL    Eosinophils Relative 1 0 - 5 %   Eosinophils Absolute 0.0 0.0 - 0.7 K/uL   Basophils Relative 0 0 - 1 %   Basophils Absolute 0.0 0.0 - 0.1 K/uL  Urinalysis, Routine w reflex microscopic (not at [facility])  Result Value Ref Range   Color, Urine YELLOW YELLOW   APPearance HAZY (A) CLEAR   Specific Gravity, Urine 1.020 1.005 - 1.030   pH 7.0 5.0 - 8.0   Glucose, UA NEGATIVE NEGATIVE mg/dL   Hgb urine dipstick NEGATIVE NEGATIVE   Bilirubin Urine NEGATIVE NEGATIVE   Ketones, ur NEGATIVE NEGATIVE mg/dL   Protein, ur NEGATIVE NEGATIVE mg/dL   Urobilinogen, UA 0.2 0.0 - 1.0 mg/dL   Nitrite NEGATIVE NEGATIVE   Leukocytes, UA NEGATIVE NEGATIVE  Pregnancy, urine  Result Value Ref Range   Preg Test, Ur NEGATIVE NEGATIVE     Imaging Review US Abdomen Limited  12/25/2014   CLINICAL DATA:  Patient with right upper quadrant and back pain.  EXAM: US ABDOMEN LIMITED - RIGHT UPPER QUADRANT  COMPARISON:  None.  FINDINGS: Gallbladder:  Gallbladder is mildly distended however there is no wall thickening or pericholecystic fluid. Negative sonographic Murphy sign. There is a 1.2 cm non mobile stone within the gallbladder fundus. Additionally there is nonmobile echogenic material within the gallbladder fundus which may represent tumefactive sludge.  Common bile duct:  Diameter: 3 mm  Liver:  No focal lesion identified. Within normal limits in parenchymal echogenicity.  IMPRESSION: No gallbladder wall thickening or evidence for pericholecystic fluid. No sonographic evidence to suggest acute cholecystitis.  Within the gallbladder fundus there is a large stone which is nonmobile. Additionally there is adjacent echogenic sludge within the gallbladder fundus which is nonmobile. Recommend follow-up ultrasound in 2- 3 months to assess for interval change as the echogenic material is  favored to represent tumefactive sludge however given fact it is nonmobile, underlying mass cannot be entirely excluded.   Electronically Signed    By: Annia Belt M.D.   On: 12/25/2014 11:00     EKG Interpretation None      MDM   Final diagnoses:  Biliary colic    Patient with right upper quadrant abdominal pain suggestive of biliary colic. Repeat ultrasound here today does not show any gallbladder thickening like the one in May showed. However does show sludge and does raise concern for a nonmobile stone in the gallbladder. I will recommend follow back up with primary care doctor GI medicine and Dr. Lovell Sheehan. Patient improved with pain medicine here. Patient without any significant lab abnormalities no leukocytosis no significant tenderness and right upper quadrant currently no significant liver function test abnormalities.  I personally performed the services described in this documentation, which was scribed in my presence. The recorded information has been reviewed and is accurate.     Vanetta Mulders, MD 12/25/14 1352

## 2014-12-25 NOTE — ED Notes (Signed)
Patient c/o right upper abd pain that radiates into flank and back. Per patient has had similar pain in past and was told gallbladder in which she seen Dr Lovell SheehanJenkins for but was told she did not need surgery or modified diet. Per patient has been watching what she its and usually when pain comes it only last approx 5 hours but this time pain is not being relieved. Denies any diarrhea, urinary symptoms, or fevers. Patient does report nausea and vomiting x1.

## 2014-12-25 NOTE — Discharge Instructions (Signed)
Biliary Colic  °Biliary colic is a steady or irregular pain in the upper abdomen. It is usually under the right side of the rib cage. It happens when gallstones interfere with the normal flow of bile from the gallbladder. Bile is a liquid that helps to digest fats. Bile is made in the liver and stored in the gallbladder. When you eat a meal, bile passes from the gallbladder through the cystic duct and the common bile duct into the small intestine. There, it mixes with partially digested food. If a gallstone blocks either of these ducts, the normal flow of bile is blocked. The muscle cells in the bile duct contract forcefully to try to move the stone. This causes the pain of biliary colic.  °SYMPTOMS  °· A person with biliary colic usually complains of pain in the upper abdomen. This pain can be: °¨ In the center of the upper abdomen just below the breastbone. °¨ In the upper-right part of the abdomen, near the gallbladder and liver. °¨ Spread back toward the right shoulder blade. °· Nausea and vomiting. °· The pain usually occurs after eating. °· Biliary colic is usually triggered by the digestive system's demand for bile. The demand for bile is high after fatty meals. Symptoms can also occur when a person who has been fasting suddenly eats a very large meal. Most episodes of biliary colic [place] after 1 to 5 hours. After the most intense pain passes, your abdomen may continue to ache mildly for about 24 hours. °DIAGNOSIS  °After you describe your symptoms, your caregiver will perform a physical exam. He or she will pay attention to the upper right portion of your belly (abdomen). This is the area of your liver and gallbladder. An ultrasound will help your caregiver look for gallstones. Specialized scans of the gallbladder may also be done. Blood tests may be done, especially if you have fever or if your pain persists. °PREVENTION  °Biliary colic can be prevented by controlling the risk factors for gallstones. Some of  these risk factors, such as heredity, increasing age, and pregnancy are a normal part of life. Obesity and a high-fat diet are risk factors you can change through a healthy lifestyle. Women going through menopause who take hormone replacement therapy (estrogen) are also more likely to develop biliary colic. °TREATMENT  °· Pain medication may be prescribed. °· You may be encouraged to eat a fat-free diet. °· If the first episode of biliary colic is severe, or episodes of colic keep retuning, surgery to remove the gallbladder (cholecystectomy) is usually recommended. This procedure can be done through small incisions using an instrument called a laparoscope. The procedure often requires a brief stay in the hospital. Some people can leave the hospital the same day. It is the most widely used treatment in people troubled by painful gallstones. It is effective and safe, with no complications in more than 90% of cases. °· If surgery cannot be done, medication that dissolves gallstones may be used. This medication is expensive and can take months or years to work. Only small stones will dissolve. °· Rarely, medication to dissolve gallstones is combined with a procedure called shock-wave lithotripsy. This procedure uses carefully aimed shock waves to break up gallstones. In many people treated with this procedure, gallstones form again within a few years. °PROGNOSIS  °If gallstones block your cystic duct or common bile duct, you are at risk for repeated episodes of biliary colic. There is also a 25% chance that you will develop   a gallbladder infection(acute cholecystitis), or some other complication of gallstones within 10 to 20 years. If you have surgery, schedule it at a time that is convenient for you and at a time when you are not sick. HOME CARE INSTRUCTIONS   Drink plenty of clear fluids.  Avoid fatty, greasy or fried foods, or any foods that make your pain worse.  Take medications as directed. SEEK MEDICAL  CARE IF:   You develop a fever over 100.5 F (38.1 C).  Your pain gets worse over time.  You develop nausea that prevents you from eating and drinking.  You develop vomiting. SEEK IMMEDIATE MEDICAL CARE IF:   You have continuous or severe belly (abdominal) pain which is not relieved with medications.  You develop nausea and vomiting which is not relieved with medications.  You have symptoms of biliary colic and you suddenly develop a fever and shaking chills. This may signal cholecystitis. Call your caregiver immediately.  You develop a yellow color to your skin or the white part of your eyes (jaundice). Document Released: 11/04/2005 Document Revised: 08/26/2011 Document Reviewed: 01/14/2008 [facility] Patient Information 2015 [place]. This information is not intended to replace advice given to you by your health care provider. Make sure you discuss any questions you have with your health care provider.  As we discussed there is no clear-cut evidence of gallstones in the gallbladder however there is evidence of some sludge this time and that needs to complete compared to your ultrasound from before that showed gallbladder wall thickening. That is now resolved. Recommend following up with GI medicine strong consideration for hiatus scan. Also may need to follow back up with Dr. Franky MachoMark Jenkins but would start with your primary care doctor and GI medicine. Try to go with bland diet liquid diet here for the next few days. Zofran for nausea. Understand why he wanted take the pain medication due to the fact that her breast-feeding. Return for any new or worse symptoms.

## 2014-12-26 ENCOUNTER — Telehealth: Payer: Self-pay

## 2014-12-26 ENCOUNTER — Other Ambulatory Visit: Payer: Self-pay

## 2014-12-26 NOTE — Telephone Encounter (Signed)
No previous GI in system, recommend GI f/u per ED MD. Rip Harbourk to make appointment pending insurance referral requirements.

## 2014-12-26 NOTE — Telephone Encounter (Signed)
Pt called and states that she was seen in ER recently and that they referred her us. Pt is wanting an appointment. Please review her ER AVS and let me know if it is ok to make her an appointment.  Patient can be reached at [phone]

## 2014-12-27 NOTE — Telephone Encounter (Signed)
Noted and Pt is set and aware of upcoming appt.

## 2014-12-31 ENCOUNTER — Encounter (HOSPITAL_COMMUNITY): Payer: Self-pay | Admitting: Emergency Medicine

## 2014-12-31 ENCOUNTER — Inpatient Hospital Stay (HOSPITAL_COMMUNITY)
Admission: EM | Admit: 2014-12-31 | Discharge: 2015-01-02 | DRG: 419 | Disposition: A | Discharge status: Home / Self Care | Payer: Managed Care, Other (non HMO) | Attending: Internal Medicine | Admitting: Internal Medicine

## 2014-12-31 ENCOUNTER — Emergency Department (HOSPITAL_COMMUNITY): Payer: Managed Care, Other (non HMO)

## 2014-12-31 DIAGNOSIS — Z8249 Family history of ischemic heart disease and other diseases of the circulatory system: Secondary | ICD-10-CM

## 2014-12-31 DIAGNOSIS — K819 Cholecystitis, unspecified: Secondary | ICD-10-CM | POA: Diagnosis present

## 2014-12-31 DIAGNOSIS — Z833 Family history of diabetes mellitus: Secondary | ICD-10-CM

## 2014-12-31 DIAGNOSIS — E876 Hypokalemia: Secondary | ICD-10-CM | POA: Diagnosis present

## 2014-12-31 DIAGNOSIS — N200 Calculus of kidney: Secondary | ICD-10-CM

## 2014-12-31 DIAGNOSIS — K8000 Calculus of gallbladder with acute cholecystitis without obstruction: Secondary | ICD-10-CM | POA: Diagnosis present

## 2014-12-31 DIAGNOSIS — K5900 Constipation, unspecified: Secondary | ICD-10-CM

## 2014-12-31 DIAGNOSIS — K5909 Other constipation: Secondary | ICD-10-CM | POA: Diagnosis not present

## 2014-12-31 DIAGNOSIS — K8010 Calculus of gallbladder with chronic cholecystitis without obstruction: Secondary | ICD-10-CM | POA: Diagnosis not present

## 2014-12-31 DIAGNOSIS — F17210 Nicotine dependence, cigarettes, uncomplicated: Secondary | ICD-10-CM | POA: Diagnosis present

## 2014-12-31 DIAGNOSIS — R1011 Right upper quadrant pain: Secondary | ICD-10-CM | POA: Diagnosis present

## 2014-12-31 LAB — CBC WITH DIFFERENTIAL/PLATELET
BASOS PCT: 0 % (ref 0–1)
Basophils Absolute: 0 10*3/uL (ref 0.0–0.1)
EOS PCT: 1 % (ref 0–5)
Eosinophils Absolute: 0.1 10*3/uL (ref 0.0–0.7)
HCT: 38.3 % (ref 36.0–46.0)
Hemoglobin: 13.4 g/dL (ref 12.0–15.0)
LYMPHS ABS: 1.8 10*3/uL (ref 0.7–4.0)
LYMPHS PCT: 19 % (ref 12–46)
MCH: 33 pg (ref 26.0–34.0)
MCHC: 35 g/dL (ref 30.0–36.0)
MCV: 94.3 fL (ref 78.0–100.0)
MONO ABS: 0.7 10*3/uL (ref 0.1–1.0)
MONOS PCT: 7 % (ref 3–12)
NEUTROS ABS: 7 10*3/uL (ref 1.7–7.7)
Neutrophils Relative %: 73 % (ref 43–77)
PLATELETS: 215 10*3/uL (ref 150–400)
RBC: 4.06 MIL/uL (ref 3.87–5.11)
RDW: 12.1 % (ref 11.5–15.5)
WBC: 9.6 10*3/uL (ref 4.0–10.5)

## 2014-12-31 LAB — COMPREHENSIVE METABOLIC PANEL
ALK PHOS: 70 U/L (ref 38–126)
ALT: 19 U/L (ref 14–54)
AST: 19 U/L (ref 15–41)
Albumin: 4.2 g/dL (ref 3.5–5.0)
Anion gap: 9 (ref 5–15)
BILIRUBIN TOTAL: 0.7 mg/dL (ref 0.3–1.2)
BUN: 13 mg/dL (ref 6–20)
CALCIUM: 8.7 mg/dL — AB (ref 8.9–10.3)
CO2: 26 mmol/L (ref 22–32)
Chloride: 103 mmol/L (ref 101–111)
Creatinine, Ser: 0.64 mg/dL (ref 0.44–1.00)
GLUCOSE: 103 mg/dL — AB (ref 65–99)
Potassium: 3.3 mmol/L — ABNORMAL LOW (ref 3.5–5.1)
SODIUM: 138 mmol/L (ref 135–145)
TOTAL PROTEIN: 7.6 g/dL (ref 6.5–8.1)

## 2014-12-31 LAB — URINALYSIS, ROUTINE W REFLEX MICROSCOPIC
Bilirubin Urine: NEGATIVE
GLUCOSE, UA: NEGATIVE mg/dL
Hgb urine dipstick: NEGATIVE
Ketones, ur: NEGATIVE mg/dL
LEUKOCYTES UA: NEGATIVE
Nitrite: NEGATIVE
PH: 6.5 (ref 5.0–8.0)
Protein, ur: NEGATIVE mg/dL
Specific Gravity, Urine: <1.005 — ABNORMAL LOW (ref 1.005–1.030)
Urobilinogen, UA: 0.2 mg/dL (ref 0.0–1.0)

## 2014-12-31 LAB — PREGNANCY, URINE: Preg Test, Ur: NEGATIVE

## 2014-12-31 LAB — LIPASE, BLOOD: LIPASE: 14 U/L — AB (ref 22–51)

## 2014-12-31 MED ORDER — IOHEXOL 300 MG/ML  SOLN
100.0000 mL | Freq: Once | INTRAMUSCULAR | Status: AC | PRN
  Administered 2014-12-31: 100 mL via INTRAVENOUS

## 2014-12-31 MED ORDER — SODIUM CHLORIDE 0.9 % IV SOLN
INTRAVENOUS | Status: DC
  Administered 2014-12-31 – 2015-01-02 (×4): via INTRAVENOUS

## 2014-12-31 MED ORDER — POLYETHYLENE GLYCOL 3350 17 G PO PACK
17.0000 g | PACK | Freq: Every day | ORAL | Status: DC
  Administered 2015-01-01: 17 g via ORAL
  Filled 2014-12-31: qty 1

## 2014-12-31 MED ORDER — POTASSIUM CHLORIDE 10 MEQ/100ML IV SOLN
10.0000 meq | INTRAVENOUS | Status: AC
  Administered 2014-12-31 – 2015-01-01 (×3): 10 meq via INTRAVENOUS
  Filled 2014-12-31 (×2): qty 100

## 2014-12-31 MED ORDER — ACETAMINOPHEN 650 MG RE SUPP: 650.0000 mg | Freq: Four times a day (QID) | RECTAL | Status: DC | PRN

## 2014-12-31 MED ORDER — SENNA 8.6 MG PO TABS
1.0000 | ORAL_TABLET | Freq: Two times a day (BID) | ORAL | Status: DC
  Administered 2015-01-01 (×2): 8.6 mg via ORAL
  Filled 2014-12-31 (×3): qty 1

## 2014-12-31 MED ORDER — HYDROMORPHONE HCL 1 MG/ML IJ SOLN
0.5000 mg | Freq: Once | INTRAMUSCULAR | Status: AC
  Administered 2014-12-31: 0.5 mg via INTRAVENOUS

## 2014-12-31 MED ORDER — MORPHINE SULFATE 4 MG/ML IJ SOLN
4.0000 mg | Freq: Once | INTRAMUSCULAR | Status: AC
  Administered 2014-12-31: 4 mg via INTRAVENOUS
  Filled 2014-12-31: qty 1

## 2014-12-31 MED ORDER — ONDANSETRON HCL 4 MG/2ML IJ SOLN
4.0000 mg | Freq: Once | INTRAMUSCULAR | Status: AC
  Administered 2014-12-31: 4 mg via INTRAVENOUS
  Filled 2014-12-31: qty 2

## 2014-12-31 MED ORDER — IBUPROFEN 600 MG PO TABS: 600.0000 mg | ORAL_TABLET | Freq: Four times a day (QID) | ORAL | Status: DC | PRN

## 2014-12-31 MED ORDER — PIPERACILLIN-TAZOBACTAM 3.375 G IVPB
3.3750 g | Freq: Once | INTRAVENOUS | Status: AC
  Administered 2014-12-31: 3.375 g via INTRAVENOUS
  Filled 2014-12-31: qty 50

## 2014-12-31 MED ORDER — ONDANSETRON HCL 4 MG PO TABS: 4.0000 mg | ORAL_TABLET | Freq: Four times a day (QID) | ORAL | Status: DC | PRN

## 2014-12-31 MED ORDER — SODIUM CHLORIDE 0.9 % IV BOLUS (SEPSIS)
1000.0000 mL | Freq: Once | INTRAVENOUS | Status: AC
  Administered 2014-12-31: 1000 mL via INTRAVENOUS

## 2014-12-31 MED ORDER — OXYCODONE HCL 5 MG PO TABS
5.0000 mg | ORAL_TABLET | ORAL | Status: DC | PRN
  Administered 2015-01-01 (×3): 5 mg via ORAL
  Filled 2014-12-31 (×4): qty 1

## 2014-12-31 MED ORDER — ACETAMINOPHEN 325 MG PO TABS: 650.0000 mg | ORAL_TABLET | Freq: Four times a day (QID) | ORAL | Status: DC | PRN

## 2014-12-31 MED ORDER — HYDROMORPHONE HCL 1 MG/ML IJ SOLN
0.5000 mg | Freq: Once | INTRAMUSCULAR | Status: DC
  Filled 2014-12-31: qty 1

## 2014-12-31 MED ORDER — ONDANSETRON HCL 4 MG/2ML IJ SOLN: 4.0000 mg | Freq: Four times a day (QID) | INTRAMUSCULAR | Status: DC | PRN

## 2014-12-31 MED ORDER — IOHEXOL 300 MG/ML  SOLN
50.0000 mL | Freq: Once | INTRAMUSCULAR | Status: AC | PRN
  Administered 2014-12-31: 50 mL via ORAL

## 2014-12-31 MED ORDER — HEPARIN SODIUM (PORCINE) 5000 UNIT/ML IJ SOLN
5000.0000 [IU] | Freq: Three times a day (TID) | INTRAMUSCULAR | Status: DC
  Administered 2014-12-31: 5000 [IU] via SUBCUTANEOUS
  Filled 2014-12-31: qty 1

## 2014-12-31 NOTE — H&P (Signed)
[facility] History and Physical  Stefanie [name]:[id number] DOB: March 09, 1986 DOA: 12/31/2014  Referring physician: Dr. Adriana Simas - [facility] PCP: Colette Ribas, MD   Chief Complaint: Abd Pain  HPI: Stefanie [name] is a 29 y.o. female   Abdominal pain. Started back in May. Episodic. Most recent episode started last night. Sometimes generalized but typically in the right upper quadrant. Radiates to back and right shoulder. Associated with nausea vomiting and constipation. Worse with certain foods and movement. Patient is a lactating mother and delivered her baby in April. Symptoms improve with pain medications and Zofran. Pain is getting worse.  Review of Systems:  Constitutional:  No weight loss, night sweats, Fevers, chills, fatigue.  HEENT:  No headaches, Difficulty swallowing,Tooth/dental problems,Sore throat,  No sneezing, itching, ear ache, nasal congestion, post nasal drip,  Cardio-vascular:  No chest pain, Orthopnea, PND, swelling in lower extremities, anasarca, dizziness, palpitations  GI: Per HPI Resp:   No shortness of breath with exertion or at rest. No excess mucus, no productive cough, No non-productive cough, No coughing up of blood.No change in color of mucus.No wheezing.No chest wall deformity  Skin:  no rash or lesions.  GU:  no dysuria, change in color of urine, no urgency or frequency. No flank pain.  Musculoskeletal:   No joint pain or swelling. No decreased range of motion. No back pain.  Psych:  No change in mood or affect. No depression or anxiety. No memory loss.   Past Medical History  Diagnosis Date  . Abnormal Pap smear of cervix   . HSV-2 (herpes simplex virus 2) infection   . Hx of gonorrhea   . Herpes 10/12/2013   Past Surgical History  Procedure Laterality Date  . Colposcopy      colpo with biopsy  . Wisdom tooth extraction     Social History:  reports that she has been smoking Cigarettes.  She has a 2.5 pack-year smoking history.  She has never used smokeless tobacco. She reports that she does not drink alcohol or use illicit drugs.  No Known Allergies  Family History  Problem Relation Age of Onset  . Heart attack Father   . Heart disease Father   . Diabetes Maternal Uncle   . Dementia Maternal Grandmother   . Cancer Maternal Grandfather     throat, lung, bladder  . Diabetes Paternal Grandmother   . Heart attack Paternal Grandfather   . Heart disease Paternal Grandfather      Prior to Admission medications   Medication Sig Start Date End Date Taking? Authorizing Provider  HYDROcodone-acetaminophen (NORCO/VICODIN) 5-325 MG per tablet 1 or 2 tabs PO q6 hours prn pain Patient taking differently: Take 0.5 tablets by mouth every 4 (four) hours as needed for moderate pain.  11/12/14  Yes Samuel Jester, DO  Prenatal Vit-Fe Fumarate-FA (MULTIVITAMIN-PRENATAL) 27-0.8 MG TABS tablet Take 1 tablet by mouth daily.    Yes Historical Provider, MD  ibuprofen (ADVIL,MOTRIN) 600 MG tablet Take 1 tablet (600 mg total) by mouth every 6 (six) hours. Patient taking differently: Take 600 mg by mouth every 6 (six) hours as needed for moderate pain.  10/06/14   Arabella Merles, CNM  norethindrone (MICRONOR,CAMILA,ERRIN) 0.35 MG tablet Take 1 tablet (0.35 mg total) by mouth daily. Patient not taking: Reported on 11/12/2014 11/07/14   Jacklyn Shell, CNM  ondansetron (ZOFRAN ODT) 4 MG disintegrating tablet Take 1 tablet (4 mg total) by mouth every 8 (eight) hours as needed. 12/25/14   Vanetta Mulders,  MD  ondansetron (ZOFRAN) 4 MG tablet Take 1 tablet (4 mg total) by mouth every 8 (eight) hours as needed for nausea or vomiting. Patient not taking: Reported on 12/25/2014 11/12/14   Samuel JesterKathleen McManus, DO   Physical Exam: Filed Vitals:   12/31/14 1803 12/31/14 1804 12/31/14 1900 12/31/14 1920  BP: 116/82  114/82 124/86  Pulse: 82  87 85  Temp:  97.9 F (36.6 C)    TempSrc:  Oral    Resp:    18  Height:      Weight:      SpO2:  100%  100% 100%    Wt Readings from Last 3 Encounters:  12/31/14 80.287 kg (177 lb)  12/25/14 80.287 kg (177 lb)  11/12/14 79.379 kg (175 lb)    General:  Appears calm and comfortable Eyes:  PERRL, normal lids, irises & conjunctiva ENT:  grossly normal hearing, lips & tongue Neck:  no LAD, masses or thyromegaly Cardiovascular:  RRR, no m/r/g. No LE edema. Respiratory:  CTA bilaterally, no w/r/r. Normal respiratory effort. Abdom: Soft, nondistended, tender to palpation in right upper quadrant/Murphy sign positive.:  no rash or induration seen on limited exam Musculoskeletal:  grossly normal tone BUE/BLE Psychiatric:  grossly normal mood and affect, speech fluent and appropriate Neurologic:  grossly non-focal.          Labs on Admission:  Basic Metabolic Panel:  Recent Labs Lab 12/25/14 1030 12/31/14 1700  NA 139 138  K 3.8 3.3*  CL 106 103  CO2 27 26  GLUCOSE 100* 103*  BUN 20 13  CREATININE 0.77 0.64  CALCIUM 8.6* 8.7*   Liver Function Tests:  Recent Labs Lab 12/25/14 1030 12/31/14 1700  AST 16 19  ALT 17 19  ALKPHOS 60 70  BILITOT 0.4 0.7  PROT 6.8 7.6  ALBUMIN 3.8 4.2    Recent Labs Lab 12/25/14 1030 12/31/14 1700  LIPASE 19* 14*   No results for input(s): AMMONIA in the last 168 hours. CBC:  Recent Labs Lab 12/25/14 1030 12/31/14 1700  WBC 8.3 9.6  NEUTROABS 6.3 7.0  HGB 12.9 13.4  HCT 38.1 38.3  MCV 95.0 94.3  PLT 195 215   Cardiac Enzymes: No results for input(s): CKTOTAL, CKMB, CKMBINDEX, TROPONINI in the last 168 hours.  BNP (last 3 results) No results for input(s): BNP in the last 8760 hours.  ProBNP (last 3 results) No results for input(s): PROBNP in the last 8760 hours.  CBG: No results for input(s): GLUCAP in the last 168 hours.  Radiological Exams on Admission: Ct Abdomen Pelvis W Contrast  12/31/2014   CLINICAL DATA:  Right upper quadrant pain  EXAM: CT ABDOMEN AND PELVIS WITH CONTRAST  TECHNIQUE: Multidetector CT  imaging of the abdomen and pelvis was performed using the standard protocol following bolus administration of intravenous contrast.  CONTRAST:  50mL OMNIPAQUE IOHEXOL 300 MG/ML SOLN, 100mL OMNIPAQUE IOHEXOL 300 MG/ML SOLN  COMPARISON:  Ultrasound 12/25/2014  FINDINGS: Lung bases are unremarkable. There is mild distended gallbladder. Significant thickening of gallbladder wall up to 9 mm. Tiny calcified gallstone is noted in gallbladder fundus. There is a noncalcified gallstone in the neck of the gallbladder measures 1.1 cm. Findings are highly suspicious for acute cholecystitis. Clinical correlation is necessary.  The pancreas, spleen and adrenal glands are unremarkable.  Kidneys are symmetrical in size and enhancement. There is nonobstructive calcified calculus in midpole of the right kidney measures 2.5 mm. No aortic aneurysm. No hydronephrosis or hydroureter.  Moderate stool  noted in right colon and transverse colon. There is no pericecal inflammation. Terminal ileum is unremarkable. Normal appendix partially visualized in coronal image 32.  The uterus and adnexa are unremarkable. The urinary bladder is unremarkable.  IMPRESSION: 1. There is mild distended gallbladder. There is thickening of gallbladder wall up to 9 mm. Mild enhancement of the mucosa. Probable non calcified gallstone in gallbladder neck region measures 1.1 cm. Findings are highly suspicious for acute cholecystitis. Clinical correlation is necessary. 2. Right nonobstructive nephrolithiasis. No hydronephrosis or hydroureter. 3. No aortic aneurysm. 4. Moderate stool in right colon and transverse colon. No pericecal inflammation. Normal appendix. 5. Unremarkable uterus and ovaries.   Electronically Signed   By: Natasha Mead M.D.   On: 12/31/2014 19:12      Assessment/Plan Principal Problem:   Cholecystitis Active Problems:   Nephrolithiasis   Hypokalemia   Constipation   Abdominal pain: Likely secondary to cholecystitis. CT imaging  consistent with gallbladder wall thickening up to 9 mm and presence of a 1.1 cm obstructing stone. Dr. Lovell Sheehan consult by ED physician and agrees with admission and likely surgery on 01/02/2015. - MedSurg - Clear liquid diet ADAT (NPO order once cleared by surgery) - IVF - Continue Zosyn - Zofran - f/u Dr. Lovell Sheehan recs - EKG, Coags  Nephrolithiasis: Incidental finding on CT scan. Likely noncontributory to current symptoms. Nonobstructive. Discussed this with patient and aware of signs and symptoms of symptomatic kidney stone passage or obstruction.  - Monitor  Hypokalemia: 3.3 on admission - KCL - Mag level  Constipation: Pt has struggled with this since delivering her infant in April. Anticipate this getting worse in the short term due to narcotics - Miralax, Senekot   Lactation: Shows lactating mother. Infant born in April. Mother to pump breastmilk and discard. Discussed finding a breast pump for the patient with nursing staff versus patient to use her own.  - breast pump to bed  Code Status: FULL DVT Prophylaxis: Hep Family Communication: Sister Disposition Plan: Pending improvement and surgery    Azarias Chiou J, MD Family Medicine [facility] [email or web address] Password [email or web address]

## 2014-12-31 NOTE — ED Notes (Signed)
Notified EDP of pt pain, order morphine to be repeated.

## 2014-12-31 NOTE — ED Notes (Signed)
EDP spoke with pt about General Surgery consult and admission verses discharge. Pt pain 4/5 on scale at this time after second dose of pain medication. Waiting for further information at this time from surgeon per EDP. Pt tearful but willing to be admitted.

## 2014-12-31 NOTE — ED Provider Notes (Addendum)
CSN: [id number]     Arrival date & time 12/31/14  1607 History   First MD Initiated Contact with Patient 12/31/14 1615     Chief Complaint  Patient presents with  . Abdominal Pain     (Consider location/radiation/quality/duration/timing/severity/associated sxs/prior Treatment) HPI..... Third visit to the ED past 2 months for biliary colic. Patient presents with right upper quadrant pain and nausea for several days. She was seen in the ED last Sunday. Pain is moderate to severe. Nothing makes symptoms better or worse.  Past Medical History  Diagnosis Date  . Abnormal Pap smear of cervix   . HSV-2 (herpes simplex virus 2) infection   . Hx of gonorrhea   . Herpes 10/12/2013   Past Surgical History  Procedure Laterality Date  . Colposcopy      colpo with biopsy  . Wisdom tooth extraction     Family History  Problem Relation Age of Onset  . Heart attack Father   . Heart disease Father   . Diabetes Maternal Uncle   . Dementia Maternal Grandmother   . Cancer Maternal Grandfather     throat, lung, bladder  . Diabetes Paternal Grandmother   . Heart attack Paternal Grandfather   . Heart disease Paternal Grandfather    History  Substance Use Topics  . Smoking status: Current Some Day Smoker -- 0.25 packs/day for 10 years    Types: Cigarettes  . Smokeless tobacco: Never Used  . Alcohol Use: No   OB History    Gravida Para Term Preterm AB TAB SAB Ectopic Multiple Living   3 2 2  1  1   0 2     Review of Systems  All other systems reviewed and are negative.     Allergies  Review of patient's allergies indicates no known allergies.  Home Medications   Prior to Admission medications   Medication Sig Start Date End Date Taking? Authorizing Provider  HYDROcodone-acetaminophen (NORCO/VICODIN) 5-325 MG per tablet 1 or 2 tabs PO q6 hours prn pain Patient taking differently: Take 0.5 tablets by mouth every 4 (four) hours as needed for moderate pain.  11/12/14  Yes Samuel JesterKathleen  McManus, DO  Prenatal Vit-Fe Fumarate-FA (MULTIVITAMIN-PRENATAL) 27-0.8 MG TABS tablet Take 1 tablet by mouth daily.    Yes Historical Provider, MD  ibuprofen (ADVIL,MOTRIN) 600 MG tablet Take 1 tablet (600 mg total) by mouth every 6 (six) hours. Patient taking differently: Take 600 mg by mouth every 6 (six) hours as needed for moderate pain.  10/06/14   Arabella MerlesKimberly D Shaw, CNM  norethindrone (MICRONOR,CAMILA,ERRIN) 0.35 MG tablet Take 1 tablet (0.35 mg total) by mouth daily. Patient not taking: Reported on 11/12/2014 11/07/14   Jacklyn ShellFrances Cresenzo-Dishmon, CNM  ondansetron (ZOFRAN ODT) 4 MG disintegrating tablet Take 1 tablet (4 mg total) by mouth every 8 (eight) hours as needed. 12/25/14   Vanetta MuldersScott Zackowski, MD  ondansetron (ZOFRAN) 4 MG tablet Take 1 tablet (4 mg total) by mouth every 8 (eight) hours as needed for nausea or vomiting. Patient not taking: Reported on 12/25/2014 11/12/14   Samuel JesterKathleen McManus, DO   BP 124/86 mmHg  Pulse 85  Temp(Src) 97.9 F (36.6 C) (Oral)  Resp 18  Ht 5\' 9"  (1.753 m)  Wt 177 lb (80.287 kg)  BMI 26.13 kg/m2  SpO2 100%  LMP 12/07/2014  Breastfeeding? Unknown Physical Exam  Constitutional: She is oriented to person, place, and time. She appears well-developed and well-nourished.  HENT:  Head: Normocephalic and atraumatic.  Eyes: Conjunctivae and EOM  are normal. Pupils are equal, round, and reactive to light.  Neck: Normal range of motion. Neck supple.  Cardiovascular: Normal rate and regular rhythm.   Pulmonary/Chest: Effort normal and breath sounds normal.  Abdominal: Soft. Bowel sounds are normal.  Tender right upper quadrant  Musculoskeletal: Normal range of motion.  Neurological: She is alert and oriented to person, place, and time.  Skin: Skin is warm and dry.  Psychiatric: She has a normal mood and affect. Her behavior is normal.  Nursing note and vitals reviewed.   ED Course  Procedures (including critical care time) Labs Review Labs Reviewed   COMPREHENSIVE METABOLIC PANEL - Abnormal; Notable for the following:    Potassium 3.3 (*)    Glucose, Bld 103 (*)    Calcium 8.7 (*)    All other components within normal limits  LIPASE, BLOOD - Abnormal; Notable for the following:    Lipase 14 (*)    All other components within normal limits  URINALYSIS, ROUTINE W REFLEX MICROSCOPIC (NOT AT [facility]) - Abnormal; Notable for the following:    Specific Gravity, Urine <1.005 (*)    All other components within normal limits  CBC WITH DIFFERENTIAL/PLATELET  PREGNANCY, URINE    Imaging Review Ct Abdomen Pelvis W Contrast  12/31/2014   CLINICAL DATA:  Right upper quadrant pain  EXAM: CT ABDOMEN AND PELVIS WITH CONTRAST  TECHNIQUE: Multidetector CT imaging of the abdomen and pelvis was performed using the standard protocol following bolus administration of intravenous contrast.  CONTRAST:  50mL OMNIPAQUE IOHEXOL 300 MG/ML SOLN, OMNIPAQUE IOHEXOL 300 MG/ML SOLN  COMPARISON:  Ultrasound 12/25/2014  FINDINGS: Lung bases are unremarkable. There is mild distended gallbladder. Significant thickening of gallbladder wall up to 9 mm. Tiny calcified gallstone is noted in gallbladder fundus. There is a noncalcified gallstone in the neck of the gallbladder measures 1.1 cm. Findings are highly suspicious for acute cholecystitis. Clinical correlation is necessary.  The pancreas, spleen and adrenal glands are unremarkable.  Kidneys are symmetrical in size and enhancement. There is nonobstructive calcified calculus in midpole of the right kidney measures 2.5 mm. No aortic aneurysm. No hydronephrosis or hydroureter.  Moderate stool noted in right colon and transverse colon. There is no pericecal inflammation. Terminal ileum is unremarkable. Normal appendix partially visualized in coronal image 32.  The uterus and adnexa are unremarkable. The urinary bladder is unremarkable.  IMPRESSION: 1. There is mild distended gallbladder. There is thickening of gallbladder wall up  to 9 mm. Mild enhancement of the mucosa. Probable non calcified gallstone in gallbladder neck region measures 1.1 cm. Findings are highly suspicious for acute cholecystitis. Clinical correlation is necessary. 2. Right nonobstructive nephrolithiasis. No hydronephrosis or hydroureter. 3. No aortic aneurysm. 4. Moderate stool in right colon and transverse colon. No pericecal inflammation. Normal appendix. 5. Unremarkable uterus and ovaries.   Electronically Signed   By: Natasha Mead M.D.   On: 12/31/2014 19:12     EKG Interpretation None      MDM   Final diagnoses:  Cholecystitis    CT abdomen/pelvis shows a mildly distended gallbladder with thickening in the gallbladder wall. Additionally there is a 1.1 cm gallstone in the gallbladder neck suspicious for cholecystitis. Discussed with general surgeon Dr. Franky Macho. Admit to general medicine.    Donnetta Hutching, MD 12/31/14 Corky Crafts  Donnetta Hutching, MD 12/31/14 2019

## 2014-12-31 NOTE — ED Notes (Signed)
Pt finished drinking contrast. CT aware. 

## 2014-12-31 NOTE — ED Notes (Signed)
Pt c/o ruq abd pain radiating into back. Pt seen in ED last Sunday and dx with a gallstone and told to foloow up with Dr.Rourk-unable to get appointment until September. Pt c/o worsening and nausea.

## 2014-12-31 NOTE — ED Notes (Signed)
Patient states that pain radiates to her back.

## 2015-01-01 DIAGNOSIS — N200 Calculus of kidney: Secondary | ICD-10-CM

## 2015-01-01 DIAGNOSIS — E876 Hypokalemia: Secondary | ICD-10-CM

## 2015-01-01 DIAGNOSIS — K819 Cholecystitis, unspecified: Secondary | ICD-10-CM

## 2015-01-01 DIAGNOSIS — K5909 Other constipation: Secondary | ICD-10-CM

## 2015-01-01 LAB — BASIC METABOLIC PANEL
ANION GAP: 3 — AB (ref 5–15)
BUN: 7 mg/dL (ref 6–20)
CHLORIDE: 110 mmol/L (ref 101–111)
CO2: 27 mmol/L (ref 22–32)
Calcium: 8 mg/dL — ABNORMAL LOW (ref 8.9–10.3)
Creatinine, Ser: 0.65 mg/dL (ref 0.44–1.00)
GFR calc non Af Amer: >60 mL/min (ref >60)
Glucose, Bld: 99 mg/dL (ref 65–99)
Potassium: 3.8 mmol/L (ref 3.5–5.1)
Sodium: 140 mmol/L (ref 135–145)

## 2015-01-01 LAB — CBC
HCT: 35.4 % — ABNORMAL LOW (ref 36.0–46.0)
Hemoglobin: 12.2 g/dL (ref 12.0–15.0)
MCH: 32.6 pg (ref 26.0–34.0)
MCHC: 34.5 g/dL (ref 30.0–36.0)
MCV: 94.7 fL (ref 78.0–100.0)
Platelets: 173 10*3/uL (ref 150–400)
RBC: 3.74 MIL/uL — ABNORMAL LOW (ref 3.87–5.11)
RDW: 12.1 % (ref 11.5–15.5)
WBC: 4.7 10*3/uL (ref 4.0–10.5)

## 2015-01-01 LAB — APTT: aPTT: 32 seconds (ref 24–37)

## 2015-01-01 LAB — MAGNESIUM: MAGNESIUM: 1.8 mg/dL (ref 1.7–2.4)

## 2015-01-01 LAB — PROTIME-INR
INR: 1.15 (ref 0.00–1.49)
Prothrombin Time: 14.9 seconds (ref 11.6–15.2)

## 2015-01-01 MED ORDER — PIPERACILLIN-TAZOBACTAM 3.375 G IVPB
3.3750 g | Freq: Three times a day (TID) | INTRAVENOUS | Status: DC
  Administered 2015-01-01 – 2015-01-02 (×3): 3.375 g via INTRAVENOUS
  Filled 2015-01-01 (×7): qty 50

## 2015-01-01 MED ORDER — PIPERACILLIN-TAZOBACTAM 3.375 G IVPB
3.3750 g | Freq: Once | INTRAVENOUS | Status: AC
  Administered 2015-01-01: 3.375 g via INTRAVENOUS
  Filled 2015-01-01: qty 50

## 2015-01-01 MED ORDER — PIPERACILLIN-TAZOBACTAM 3.375 G IVPB
INTRAVENOUS | Status: AC
  Filled 2015-01-01: qty 50

## 2015-01-01 NOTE — Plan of Care (Signed)
Problem: Phase I Progression Outcomes Goal: Pain controlled with appropriate interventions Outcome: Progressing Pt pain level has remained below a 5 this shift. Goal: OOB as tolerated unless otherwise ordered Outcome: Progressing Pt ambulates to bathroom.  Goal: Initial discharge plan identified Outcome: Progressing Expected discharge date noted. Goal: Hemodynamically stable Outcome: Progressing See flowsheet

## 2015-01-01 NOTE — Consult Note (Signed)
Reason for Consult: Right upper quadrant abdominal pain Referring Physician: Hospitalist  Stefanie [name] is an 29 y.o. female.  HPI: Patient is a 29 year old postpartum white female who presents with recurrent episodes of right upper quadrant abdominal pain. Recent ultrasound reveals cholelithiasis. This was confirmed by CAT scan yesterday in the emergency room. I have seen her in the past, and her symptoms have worsened. She denies any fever or chills. She is having right upper quadrant abdominal pain which radiates around the right flank to the back. No fever, chills, or jaundice.  Past Medical History  Diagnosis Date  . Abnormal Pap smear of cervix   . HSV-2 (herpes simplex virus 2) infection   . Hx of gonorrhea   . Herpes 10/12/2013    Past Surgical History  Procedure Laterality Date  . Colposcopy      colpo with biopsy  . Wisdom tooth extraction      Family History  Problem Relation Age of Onset  . Heart attack Father   . Heart disease Father   . Diabetes Maternal Uncle   . Dementia Maternal Grandmother   . Cancer Maternal Grandfather     throat, lung, bladder  . Diabetes Paternal Grandmother   . Heart attack Paternal Grandfather   . Heart disease Paternal Grandfather     Social History:  reports that she has been smoking Cigarettes.  She has a 2.5 pack-year smoking history. She has never used smokeless tobacco. She reports that she does not drink alcohol or use illicit drugs.  Allergies: No Known Allergies  Medications: I have reviewed the patient's current medications.  Results for orders placed or performed during the hospital encounter of 12/31/14 (from the past 48 hour(s))  Pregnancy, urine     Status: None   Collection Time: 12/31/14  4:45 PM  Result Value Ref Range   Preg Test, Ur NEGATIVE NEGATIVE  Urinalysis, Routine w reflex microscopic (not at [facility])     Status: Abnormal   Collection Time: 12/31/14  4:45 PM  Result Value Ref Range   Color, Urine YELLOW  YELLOW   APPearance CLEAR CLEAR   Specific Gravity, Urine <1.005 (L) 1.005 - 1.030   pH 6.5 5.0 - 8.0   Glucose, UA NEGATIVE NEGATIVE mg/dL   Hgb urine dipstick NEGATIVE NEGATIVE   Bilirubin Urine NEGATIVE NEGATIVE   Ketones, ur NEGATIVE NEGATIVE mg/dL   Protein, ur NEGATIVE NEGATIVE mg/dL   Urobilinogen, UA 0.2 0.0 - 1.0 mg/dL   Nitrite NEGATIVE NEGATIVE   Leukocytes, UA NEGATIVE NEGATIVE    Comment: MICROSCOPIC NOT DONE ON URINES WITH NEGATIVE PROTEIN, BLOOD, LEUKOCYTES, NITRITE, OR GLUCOSE <1000 mg/dL.  Comprehensive metabolic panel     Status: Abnormal   Collection Time: 12/31/14  5:00 PM  Result Value Ref Range   Sodium 138 135 - 145 mmol/L   Potassium 3.3 (L) 3.5 - 5.1 mmol/L   Chloride 103 101 - 111 mmol/L   CO2 26 22 - 32 mmol/L   Glucose, Bld 103 (H) 65 - 99 mg/dL   BUN 13 6 - 20 mg/dL   Creatinine, Ser 0.64 0.44 - 1.00 mg/dL   Calcium 8.7 (L) 8.9 - 10.3 mg/dL   Total Protein 7.6 6.5 - 8.1 g/dL   Albumin 4.2 3.5 - 5.0 g/dL   AST 19 15 - 41 U/L   ALT 19 14 - 54 U/L   Alkaline Phosphatase 70 38 - 126 U/L   Total Bilirubin 0.7 0.3 - 1.2 mg/dL   GFR  calc non Af Amer >60 >60 mL/min   GFR calc Af Amer >60 >60 mL/min    Comment: (NOTE) The eGFR has been calculated using the CKD EPI equation. This calculation has not been validated in all clinical situations. eGFR's persistently <60 mL/min signify possible Chronic Kidney Disease.    Anion gap 9 5 - 15  CBC with Differential     Status: None   Collection Time: 12/31/14  5:00 PM  Result Value Ref Range   WBC 9.6 4.0 - 10.5 K/uL   RBC 4.06 3.87 - 5.11 MIL/uL   Hemoglobin 13.4 12.0 - 15.0 g/dL   HCT 38.3 36.0 - 46.0 %   MCV 94.3 78.0 - 100.0 fL   MCH 33.0 26.0 - 34.0 pg   MCHC 35.0 30.0 - 36.0 g/dL   RDW 12.1 11.5 - 15.5 %   Platelets 215 150 - 400 K/uL   Neutrophils Relative % 73 43 - 77 %   Neutro Abs 7.0 1.7 - 7.7 K/uL   Lymphocytes Relative 19 12 - 46 %   Lymphs Abs 1.8 0.7 - 4.0 K/uL   Monocytes Relative 7 3 -  12 %   Monocytes Absolute 0.7 0.1 - 1.0 K/uL   Eosinophils Relative 1 0 - 5 %   Eosinophils Absolute 0.1 0.0 - 0.7 K/uL   Basophils Relative 0 0 - 1 %   Basophils Absolute 0.0 0.0 - 0.1 K/uL  Lipase, blood     Status: Abnormal   Collection Time: 12/31/14  5:00 PM  Result Value Ref Range   Lipase 14 (L) 22 - 51 U/L  CBC     Status: Abnormal   Collection Time: 01/01/15  5:47 AM  Result Value Ref Range   WBC 4.7 4.0 - 10.5 K/uL   RBC 3.74 (L) 3.87 - 5.11 MIL/uL   Hemoglobin 12.2 12.0 - 15.0 g/dL   HCT 35.4 (L) 36.0 - 46.0 %   MCV 94.7 78.0 - 100.0 fL   MCH 32.6 26.0 - 34.0 pg   MCHC 34.5 30.0 - 36.0 g/dL   RDW 12.1 11.5 - 15.5 %   Platelets 173 150 - 400 K/uL  Basic metabolic panel     Status: Abnormal   Collection Time: 01/01/15  5:47 AM  Result Value Ref Range   Sodium 140 135 - 145 mmol/L   Potassium 3.8 3.5 - 5.1 mmol/L   Chloride 110 101 - 111 mmol/L   CO2 27 22 - 32 mmol/L   Glucose, Bld 99 65 - 99 mg/dL   BUN 7 6 - 20 mg/dL   Creatinine, Ser 0.65 0.44 - 1.00 mg/dL   Calcium 8.0 (L) 8.9 - 10.3 mg/dL   GFR calc non Af Amer >60 >60 mL/min   GFR calc Af Amer >60 >60 mL/min    Comment: (NOTE) The eGFR has been calculated using the CKD EPI equation. This calculation has not been validated in all clinical situations. eGFR's persistently <60 mL/min signify possible Chronic Kidney Disease.    Anion gap 3 (L) 5 - 15  APTT     Status: None   Collection Time: 01/01/15  5:47 AM  Result Value Ref Range   aPTT 32 24 - 37 seconds  Protime-INR     Status: None   Collection Time: 01/01/15  5:47 AM  Result Value Ref Range   Prothrombin Time 14.9 11.6 - 15.2 seconds   INR 1.15 0.00 - 1.49  Magnesium     Status: None  Collection Time: 01/01/15  5:47 AM  Result Value Ref Range   Magnesium 1.8 1.7 - 2.4 mg/dL    Ct Abdomen Pelvis W Contrast  12/31/2014   CLINICAL DATA:  Right upper quadrant pain  EXAM: CT ABDOMEN AND PELVIS WITH CONTRAST  TECHNIQUE: Multidetector CT imaging of  the abdomen and pelvis was performed using the standard protocol following bolus administration of intravenous contrast.  CONTRAST:  90mL OMNIPAQUE IOHEXOL 300 MG/ML SOLN, 142mL OMNIPAQUE IOHEXOL 300 MG/ML SOLN  COMPARISON:  Ultrasound 12/25/2014  FINDINGS: Lung bases are unremarkable. There is mild distended gallbladder. Significant thickening of gallbladder wall up to 9 mm. Tiny calcified gallstone is noted in gallbladder fundus. There is a noncalcified gallstone in the neck of the gallbladder measures 1.1 cm. Findings are highly suspicious for acute cholecystitis. Clinical correlation is necessary.  The pancreas, spleen and adrenal glands are unremarkable.  Kidneys are symmetrical in size and enhancement. There is nonobstructive calcified calculus in midpole of the right kidney measures 2.5 mm. No aortic aneurysm. No hydronephrosis or hydroureter.  Moderate stool noted in right colon and transverse colon. There is no pericecal inflammation. Terminal ileum is unremarkable. Normal appendix partially visualized in coronal image 32.  The uterus and adnexa are unremarkable. The urinary bladder is unremarkable.  IMPRESSION: 1. There is mild distended gallbladder. There is thickening of gallbladder wall up to 9 mm. Mild enhancement of the mucosa. Probable non calcified gallstone in gallbladder neck region measures 1.1 cm. Findings are highly suspicious for acute cholecystitis. Clinical correlation is necessary. 2. Right nonobstructive nephrolithiasis. No hydronephrosis or hydroureter. 3. No aortic aneurysm. 4. Moderate stool in right colon and transverse colon. No pericecal inflammation. Normal appendix. 5. Unremarkable uterus and ovaries.   Electronically Signed   By: Lahoma Crocker M.D.   On: 12/31/2014 19:12    ROS: See chart Blood pressure 111/62, pulse 68, temperature 98 F (36.7 C), temperature source Oral, resp. rate 18, height $RemoveBe'5\' 9"'wOLnpANkB$  (1.753 m), weight 79.062 kg (174 lb 4.8 oz), last menstrual period 12/07/2014,  SpO2 97 %, unknown if currently breastfeeding. Physical Exam: Pleasant white female in no acute distress. HEENT examination reveals no scleral icterus. Lungs clear to auscultation with equal breath sounds bilaterally. Heart examination reveals a regular rate and rhythm without S3, S4, murmurs. Abdomen is soft with tenderness noted right upper quadrant to palpation. No hepatosplenomegaly, masses, or hernias identified. No rigidity is noted.  Assessment/Plan: Impression: Cholecystitis, cholelithiasis Plan: Patient be taken to the operating room tomorrow for laparoscopic cholecystectomy. The risks and benefits of the procedure including bleeding, infection, hepatobiliary injury, and the possibility of an open procedure were fully explained to the patient, who gave informed consent.  Ziquan Fidel A 01/01/2015, 10:25 AM

## 2015-01-01 NOTE — Progress Notes (Signed)
[facility] PROGRESS NOTE  Stefanie [name] ZOX:[id number] DOB: 1986-06-09 DOA: 12/31/2014 PCP: Colette Ribas, MD  Assessment/Plan: 1. Acute cholecystitis. Seen by general surgery and plans for cholecystectomy. Currently she is pain-free. She is on intravenous antibiotic. Nothing by mouth after midnight. 2. Nephrolithiasis. Incidental finding on CT . likely noncontributory to current symptoms. 3. Hypokalemia. Improved 4. Constipation continue MiraLAX  Code Status: full code Family Communication: discussed with patient Disposition Plan: discharge home once improved   Consultants:  General surgery  Procedures:    Antibiotics:  Zosyn 7/17  HPI/Subjective: No abdominal pain or vomiting.  Objective: Filed Vitals:   01/01/15 0554  BP: 111/62  Pulse: 68  Temp: 98 F (36.7 C)  Resp: 18    Intake/Output Summary (Last 24 hours) at 01/01/15 1639 Last data filed at 01/01/15 0656  Gross per 24 hour  Intake 1056.67 ml  Output    800 ml  Net 256.67 ml   Filed Weights   12/31/14 1613 12/31/14 2139  Weight: 80.287 kg (177 lb) 79.062 kg (174 lb 4.8 oz)    Exam:   General:  NAD  Cardiovascular: S1, s2 RRR  Respiratory: CTA B  Abdomen: soft, nt, nd, bs+  Musculoskeletal: no edema b/l   Data Reviewed: Basic Metabolic Panel:  Recent Labs Lab 12/31/14 1700 01/01/15 0547  NA 138 140  K 3.3* 3.8  CL 103 110  CO2 26 27  GLUCOSE 103* 99  BUN 13 7  CREATININE 0.64 0.65  CALCIUM 8.7* 8.0*  MG  --  1.8   Liver Function Tests:  Recent Labs Lab 12/31/14 1700  AST 19  ALT 19  ALKPHOS 70  BILITOT 0.7  PROT 7.6  ALBUMIN 4.2    Recent Labs Lab 12/31/14 1700  LIPASE 14*   No results for input(s): AMMONIA in the last 168 hours. CBC:  Recent Labs Lab 12/31/14 1700 01/01/15 0547  WBC 9.6 4.7  NEUTROABS 7.0  --   HGB 13.4 12.2  HCT 38.3 35.4*  MCV 94.3 94.7  PLT 215 173   Cardiac Enzymes: No results for input(s): CKTOTAL, CKMB,  CKMBINDEX, TROPONINI in the last 168 hours. BNP (last 3 results) No results for input(s): BNP in the last 8760 hours.  ProBNP (last 3 results) No results for input(s): PROBNP in the last 8760 hours.  CBG: No results for input(s): GLUCAP in the last 168 hours.  No results found for this or any previous visit (from the past 240 hour(s)).   Studies: Ct Abdomen Pelvis W Contrast  12/31/2014   CLINICAL DATA:  Right upper quadrant pain  EXAM: CT ABDOMEN AND PELVIS WITH CONTRAST  TECHNIQUE: Multidetector CT imaging of the abdomen and pelvis was performed using the standard protocol following bolus administration of intravenous contrast.  CONTRAST:  50mL OMNIPAQUE IOHEXOL 300 MG/ML SOLN, OMNIPAQUE IOHEXOL 300 MG/ML SOLN  COMPARISON:  Ultrasound 12/25/2014  FINDINGS: Lung bases are unremarkable. There is mild distended gallbladder. Significant thickening of gallbladder wall up to 9 mm. Tiny calcified gallstone is noted in gallbladder fundus. There is a noncalcified gallstone in the neck of the gallbladder measures 1.1 cm. Findings are highly suspicious for acute cholecystitis. Clinical correlation is necessary.  The pancreas, spleen and adrenal glands are unremarkable.  Kidneys are symmetrical in size and enhancement. There is nonobstructive calcified calculus in midpole of the right kidney measures 2.5 mm. No aortic aneurysm. No hydronephrosis or hydroureter.  Moderate stool noted in right colon and transverse colon. There is no  pericecal inflammation. Terminal ileum is unremarkable. Normal appendix partially visualized in coronal image 32.  The uterus and adnexa are unremarkable. The urinary bladder is unremarkable.  IMPRESSION: 1. There is mild distended gallbladder. There is thickening of gallbladder wall up to 9 mm. Mild enhancement of the mucosa. Probable non calcified gallstone in gallbladder neck region measures 1.1 cm. Findings are highly suspicious for acute cholecystitis. Clinical correlation  is necessary. 2. Right nonobstructive nephrolithiasis. No hydronephrosis or hydroureter. 3. No aortic aneurysm. 4. Moderate stool in right colon and transverse colon. No pericecal inflammation. Normal appendix. 5. Unremarkable uterus and ovaries.   Electronically Signed   By: Natasha MeadLiviu  Pop M.D.   On: 12/31/2014 19:12    Scheduled Meds: . piperacillin-tazobactam (ZOSYN)  IV  3.375 g Intravenous Q8H  . polyethylene glycol  17 g Oral Daily  . senna  1 tablet Oral BID   Continuous Infusions: . sodium chloride 100 mL/hr at 01/01/15 [id number]    Principal Problem:   Cholecystitis Active Problems:   Nephrolithiasis   Hypokalemia   Constipation    Time spent: 15mins    [facility] Center Of Nj LLCMEMON,JEHANZEB  [facility] Pager [phone]. If 7PM-7AM, please contact night-coverage at [email or web address], password [email or web address] 01/01/2015, 4:39 PM  LOS: 1 day

## 2015-01-01 NOTE — Progress Notes (Signed)
ANTIBIOTIC CONSULT NOTE-Preliminary  Pharmacy Consult for Zosyn Indication: Intra-abdominal infection  No Known Allergies  Patient Measurements: Height: 5\' 9"  (175.3 cm) Weight: 174 lb 4.8 oz (79.062 kg) IBW/kg (Calculated) : 66.2  Vital Signs: Temp: 98 F (36.7 C) (07/16 2139) Temp Source: Oral (07/16 2139) BP: 106/67 mmHg (07/16 2139) Pulse Rate: 82 (07/16 2139)  Labs:  Recent Labs  12/31/14 1700  WBC 9.6  HGB 13.4  PLT 215  CREATININE 0.64    Estimated Creatinine Clearance: 106.5 mL/min (by C-G formula based on Cr of 0.64).  No results for input(s): VANCOTROUGH, VANCOPEAK, VANCORANDOM, GENTTROUGH, GENTPEAK, GENTRANDOM, TOBRATROUGH, TOBRAPEAK, TOBRARND, AMIKACINPEAK, AMIKACINTROU, AMIKACIN in the last 72 hours.   Microbiology: No results found for this or any previous visit (from the past 720 hour(s)).  Medical History: Past Medical History  Diagnosis Date  . Abnormal Pap smear of cervix   . HSV-2 (herpes simplex virus 2) infection   . Hx of gonorrhea   . Herpes 10/12/2013    Medications:  Zosyn 3.375 Gm IV given in the ED at 2000 12/31/14  Assessment: 29 yo female with abdominal pain, likely secondary to cholecystitis, consistent with gall bladder CT imaging. Surgical consult pending. Empiric antibiotics for intra-abdominal infection.  Goal of Therapy:  Eradicate infection  Plan:  Preliminary review of pertinent patient information completed. Protocol will be initiated with a one-time dose of Zosyn 3.375 Gm IV. Clinical pharmacist will complete review during morning rounds and assess patient and finalize treatment plan.  Arelia SneddonMason, Dvonte Gatliff Anne, Hedrick Medical CenterRPH 01/01/2015,2:07 AM

## 2015-01-01 NOTE — Progress Notes (Signed)
Pharmacy notified nurse that orders to continue Zosyn were not entered. Notified MD. MD put in continuing orders for Zosyn.

## 2015-01-01 NOTE — Progress Notes (Signed)
ANTIBIOTIC CONSULT NOTE- follow up  Pharmacy Consult for Zosyn Indication: Intra-abdominal infection  No Known Allergies  Patient Measurements: Height: 5\' 9"  (175.3 cm) Weight: 174 lb 4.8 oz (79.062 kg) IBW/kg (Calculated) : 66.2  Vital Signs: Temp: 98 F (36.7 C) (07/17 0554) Temp Source: Oral (07/17 0554) BP: 111/62 mmHg (07/17 0554) Pulse Rate: 68 (07/17 0554)  Labs:  Recent Labs  12/31/14 1700 01/01/15 0547  WBC 9.6 4.7  HGB 13.4 12.2  PLT 215 173  CREATININE 0.64 0.65    Estimated Creatinine Clearance: 106.5 mL/min (by C-G formula based on Cr of 0.65).  No results for input(s): VANCOTROUGH, VANCOPEAK, VANCORANDOM, GENTTROUGH, GENTPEAK, GENTRANDOM, TOBRATROUGH, TOBRAPEAK, TOBRARND, AMIKACINPEAK, AMIKACINTROU, AMIKACIN in the last 72 hours.   Microbiology: No results found for this or any previous visit (from the past 720 hour(s)).  Medical History: Past Medical History  Diagnosis Date  . Abnormal Pap smear of cervix   . HSV-2 (herpes simplex virus 2) infection   . Hx of gonorrhea   . Herpes 10/12/2013   Medications:  Zosyn 3.375 Gm IV given in the ED at 2000 and 0300 12/31/14  Assessment: 29 yo female with abdominal pain, likely secondary to cholecystitis, consistent with gall bladder CT imaging. Surgical consult pending. Empiric antibiotics for intra-abdominal infection.  Goal of Therapy:  Eradicate infection  Plan:  Zosyn 3.375gm IV q8h, each dose over 4 hrs Monitor labs and progress  Wayland DenisHall, Sharad Vaneaton A, RPH 01/01/2015,8:03 AM

## 2015-01-02 ENCOUNTER — Inpatient Hospital Stay (HOSPITAL_COMMUNITY): Payer: Managed Care, Other (non HMO) | Admitting: Anesthesiology

## 2015-01-02 ENCOUNTER — Encounter (HOSPITAL_COMMUNITY)
Admission: EM | Disposition: A | Discharge status: Home / Self Care | Payer: Self-pay | Source: Home / Self Care | Attending: Internal Medicine

## 2015-01-02 ENCOUNTER — Encounter (HOSPITAL_COMMUNITY): Payer: Self-pay | Admitting: *Deleted

## 2015-01-02 DIAGNOSIS — K8010 Calculus of gallbladder with chronic cholecystitis without obstruction: Secondary | ICD-10-CM

## 2015-01-02 HISTORY — PX: CHOLECYSTECTOMY: SHX55

## 2015-01-02 LAB — SURGICAL PCR SCREEN
MRSA, PCR: NEGATIVE
Staphylococcus aureus: NEGATIVE

## 2015-01-02 SURGERY — LAPAROSCOPIC CHOLECYSTECTOMY
Anesthesia: General | Site: Abdomen

## 2015-01-02 MED ORDER — ROCURONIUM BROMIDE 50 MG/5ML IV SOLN
INTRAVENOUS | Status: AC
  Filled 2015-01-02: qty 3

## 2015-01-02 MED ORDER — KETOROLAC TROMETHAMINE 30 MG/ML IJ SOLN
30.0000 mg | Freq: Once | INTRAMUSCULAR | Status: AC
  Administered 2015-01-02: 30 mg via INTRAVENOUS

## 2015-01-02 MED ORDER — GLYCOPYRROLATE 0.2 MG/ML IJ SOLN
INTRAMUSCULAR | Status: AC
  Filled 2015-01-02: qty 3

## 2015-01-02 MED ORDER — PRENATAL 27-0.8 MG PO TABS
1.0000 | ORAL_TABLET | Freq: Every day | ORAL | Status: DC
  Filled 2015-01-02 (×3): qty 1

## 2015-01-02 MED ORDER — ROCURONIUM BROMIDE 100 MG/10ML IV SOLN
INTRAVENOUS | Status: DC | PRN
Start: 2015-01-02 — End: 2015-01-02
  Administered 2015-01-02: 20 mg via INTRAVENOUS
  Administered 2015-01-02: 5 mg via INTRAVENOUS

## 2015-01-02 MED ORDER — POVIDONE-IODINE 10 % OINT PACKET
TOPICAL_OINTMENT | CUTANEOUS | Status: DC | PRN
  Administered 2015-01-02: 1 via TOPICAL

## 2015-01-02 MED ORDER — SUCCINYLCHOLINE CHLORIDE 20 MG/ML IJ SOLN
INTRAMUSCULAR | Status: AC
Start: 2015-01-02 — End: 2015-01-02
  Filled 2015-01-02: qty 1

## 2015-01-02 MED ORDER — HYDROMORPHONE HCL 1 MG/ML IJ SOLN
1.0000 mg | INTRAMUSCULAR | Status: DC | PRN
  Administered 2015-01-02: 1 mg via INTRAVENOUS
  Filled 2015-01-02: qty 1

## 2015-01-02 MED ORDER — PROMETHAZINE HCL 25 MG/ML IJ SOLN
INTRAMUSCULAR | Status: AC
  Filled 2015-01-02: qty 1

## 2015-01-02 MED ORDER — LACTATED RINGERS IV SOLN
INTRAVENOUS | Status: DC
  Administered 2015-01-02 (×2): via INTRAVENOUS

## 2015-01-02 MED ORDER — ONDANSETRON HCL 4 MG/2ML IJ SOLN
INTRAMUSCULAR | Status: AC
  Filled 2015-01-02: qty 2

## 2015-01-02 MED ORDER — LORAZEPAM 2 MG/ML IJ SOLN: 1.0000 mg | INTRAMUSCULAR | Status: DC | PRN

## 2015-01-02 MED ORDER — ONDANSETRON HCL 4 MG/2ML IJ SOLN
4.0000 mg | Freq: Once | INTRAMUSCULAR | Status: AC
  Administered 2015-01-02: 4 mg via INTRAVENOUS

## 2015-01-02 MED ORDER — PROPOFOL 10 MG/ML IV BOLUS
INTRAVENOUS | Status: DC | PRN
  Administered 2015-01-02: 150 mg via INTRAVENOUS

## 2015-01-02 MED ORDER — SODIUM CHLORIDE 0.9 % IR SOLN
Status: DC | PRN
  Administered 2015-01-02: 1000 mL

## 2015-01-02 MED ORDER — MIDAZOLAM HCL 2 MG/2ML IJ SOLN
1.0000 mg | INTRAMUSCULAR | Status: DC | PRN
  Administered 2015-01-02: 2 mg via INTRAVENOUS

## 2015-01-02 MED ORDER — BUPIVACAINE HCL (PF) 0.5 % IJ SOLN
INTRAMUSCULAR | Status: DC | PRN
  Administered 2015-01-02: 10 mL

## 2015-01-02 MED ORDER — NEOSTIGMINE METHYLSULFATE 10 MG/10ML IV SOLN
INTRAVENOUS | Status: AC
  Filled 2015-01-02: qty 1

## 2015-01-02 MED ORDER — BUPIVACAINE HCL (PF) 0.5 % IJ SOLN
INTRAMUSCULAR | Status: AC
  Filled 2015-01-02: qty 30

## 2015-01-02 MED ORDER — GLYCOPYRROLATE 0.2 MG/ML IJ SOLN
INTRAMUSCULAR | Status: AC
Start: 2015-01-02 — End: 2015-01-02
  Filled 2015-01-02: qty 1

## 2015-01-02 MED ORDER — NEOSTIGMINE METHYLSULFATE 10 MG/10ML IV SOLN
INTRAVENOUS | Status: DC | PRN
  Administered 2015-01-02: 4 mg via INTRAVENOUS

## 2015-01-02 MED ORDER — ENOXAPARIN SODIUM 40 MG/0.4ML ~~LOC~~ SOLN: 40.0000 mg | SUBCUTANEOUS | Status: DC

## 2015-01-02 MED ORDER — LIDOCAINE HCL 1 % IJ SOLN
INTRAMUSCULAR | Status: DC | PRN
  Administered 2015-01-02: 40 mg via INTRADERMAL

## 2015-01-02 MED ORDER — FENTANYL CITRATE (PF) 250 MCG/5ML IJ SOLN
INTRAMUSCULAR | Status: DC | PRN
  Administered 2015-01-02 (×5): 50 ug via INTRAVENOUS

## 2015-01-02 MED ORDER — KETOROLAC TROMETHAMINE 30 MG/ML IJ SOLN
INTRAMUSCULAR | Status: AC
  Filled 2015-01-02: qty 1

## 2015-01-02 MED ORDER — FENTANYL CITRATE (PF) 250 MCG/5ML IJ SOLN
INTRAMUSCULAR | Status: AC
  Filled 2015-01-02: qty 5

## 2015-01-02 MED ORDER — CHLORHEXIDINE GLUCONATE 4 % EX LIQD
1.0000 "application " | Freq: Once | CUTANEOUS | Status: AC
  Administered 2015-01-02: 1 via TOPICAL
  Filled 2015-01-02: qty 15

## 2015-01-02 MED ORDER — ONDANSETRON HCL 4 MG/2ML IJ SOLN: 4.0000 mg | Freq: Once | INTRAMUSCULAR | Status: DC | PRN

## 2015-01-02 MED ORDER — GLYCOPYRROLATE 0.2 MG/ML IJ SOLN
0.2000 mg | Freq: Once | INTRAMUSCULAR | Status: AC
  Administered 2015-01-02: 0.2 mg via INTRAVENOUS

## 2015-01-02 MED ORDER — GLYCOPYRROLATE 0.2 MG/ML IJ SOLN
INTRAMUSCULAR | Status: DC | PRN
Start: 2015-01-02 — End: 2015-01-02
  Administered 2015-01-02: 0.2 mg via INTRAVENOUS

## 2015-01-02 MED ORDER — POVIDONE-IODINE 10 % EX OINT
TOPICAL_OINTMENT | CUTANEOUS | Status: AC
  Filled 2015-01-02: qty 1

## 2015-01-02 MED ORDER — ARTIFICIAL TEARS OP OINT
TOPICAL_OINTMENT | OPHTHALMIC | Status: AC
  Filled 2015-01-02: qty 7

## 2015-01-02 MED ORDER — PROMETHAZINE HCL 25 MG/ML IJ SOLN
6.2500 mg | Freq: Once | INTRAMUSCULAR | Status: AC
  Administered 2015-01-02: 6.25 mg via INTRAVENOUS

## 2015-01-02 MED ORDER — HEMOSTATIC AGENTS (NO CHARGE) OPTIME
TOPICAL | Status: DC | PRN
  Administered 2015-01-02: 1 via TOPICAL

## 2015-01-02 MED ORDER — SUCCINYLCHOLINE CHLORIDE 20 MG/ML IJ SOLN
INTRAMUSCULAR | Status: DC | PRN
  Administered 2015-01-02: 110 mg via INTRAVENOUS

## 2015-01-02 MED ORDER — HYDROCODONE-ACETAMINOPHEN 5-325 MG PO TABS: 0.5000 | ORAL_TABLET | ORAL | Status: DC | PRN

## 2015-01-02 MED ORDER — FENTANYL CITRATE (PF) 100 MCG/2ML IJ SOLN
INTRAMUSCULAR | Status: AC
  Filled 2015-01-02: qty 2

## 2015-01-02 MED ORDER — MIDAZOLAM HCL 2 MG/2ML IJ SOLN
INTRAMUSCULAR | Status: AC
  Filled 2015-01-02: qty 2

## 2015-01-02 MED ORDER — ENOXAPARIN SODIUM 40 MG/0.4ML ~~LOC~~ SOLN
40.0000 mg | Freq: Once | SUBCUTANEOUS | Status: AC
  Administered 2015-01-02: 40 mg via SUBCUTANEOUS
  Filled 2015-01-02: qty 0.4

## 2015-01-02 MED ORDER — HYDROCODONE-ACETAMINOPHEN 5-325 MG PO TABS: 1.0000 | ORAL_TABLET | ORAL | Status: DC | PRN

## 2015-01-02 MED ORDER — FENTANYL CITRATE (PF) 100 MCG/2ML IJ SOLN
25.0000 ug | INTRAMUSCULAR | Status: DC | PRN
  Administered 2015-01-02 (×2): 50 ug via INTRAVENOUS

## 2015-01-02 MED ORDER — PROPOFOL 10 MG/ML IV BOLUS
INTRAVENOUS | Status: AC
  Filled 2015-01-02: qty 20

## 2015-01-02 MED ORDER — LIDOCAINE HCL (PF) 1 % IJ SOLN
INTRAMUSCULAR | Status: AC
  Filled 2015-01-02: qty 5

## 2015-01-02 SURGICAL SUPPLY — 44 items
APPLIER CLIP LAPSCP 10X32 DD (CLIP) ×3 IMPLANT
BAG HAMPER (MISCELLANEOUS) ×3 IMPLANT
BAG SPEC RTRVL LRG 6X4 10 (ENDOMECHANICALS) ×1
CHLORAPREP W/TINT 26ML (MISCELLANEOUS) ×3 IMPLANT
CLOTH BEACON ORANGE TIMEOUT ST (SAFETY) ×3 IMPLANT
COVER LIGHT HANDLE STERIS (MISCELLANEOUS) ×6 IMPLANT
DECANTER SPIKE VIAL GLASS SM (MISCELLANEOUS) ×3 IMPLANT
ELECT REM PT RETURN 9FT ADLT (ELECTROSURGICAL) ×3
ELECTRODE REM PT RTRN 9FT ADLT (ELECTROSURGICAL) ×1 IMPLANT
FILTER SMOKE EVAC LAPAROSHD (FILTER) ×3 IMPLANT
FORMALIN 10 PREFIL 120ML (MISCELLANEOUS) ×3 IMPLANT
GLOVE BIOGEL PI IND STRL 7.0 (GLOVE) IMPLANT
GLOVE BIOGEL PI INDICATOR 7.0 (GLOVE) ×6
GLOVE ECLIPSE 6.5 STRL STRAW (GLOVE) ×3 IMPLANT
GLOVE ECLIPSE 7.0 STRL STRAW (GLOVE) ×3 IMPLANT
GLOVE EXAM NITRILE MD LF STRL (GLOVE) ×2 IMPLANT
GLOVE SURG SS PI 7.5 STRL IVOR (GLOVE) ×3 IMPLANT
GOWN STRL REUS W/ TWL XL LVL3 (GOWN DISPOSABLE) ×1 IMPLANT
GOWN STRL REUS W/TWL LRG LVL3 (GOWN DISPOSABLE) ×6 IMPLANT
GOWN STRL REUS W/TWL XL LVL3 (GOWN DISPOSABLE) ×3
HEMOSTAT SNOW SURGICEL 2X4 (HEMOSTASIS) ×3 IMPLANT
INST SET LAPROSCOPIC AP (KITS) ×3 IMPLANT
IV NS IRRIG 3000ML ARTHROMATIC (IV SOLUTION) IMPLANT
KIT ROOM TURNOVER APOR (KITS) ×3 IMPLANT
MANIFOLD NEPTUNE II (INSTRUMENTS) ×3 IMPLANT
NEEDLE INSUFFLATION 14GA 120MM (NEEDLE) ×3 IMPLANT
NS IRRIG 1000ML POUR BTL (IV SOLUTION) ×3 IMPLANT
PACK LAP CHOLE LZT030E (CUSTOM PROCEDURE TRAY) ×3 IMPLANT
PAD ARMBOARD 7.5X6 YLW CONV (MISCELLANEOUS) ×3 IMPLANT
POUCH SPECIMEN RETRIEVAL 10MM (ENDOMECHANICALS) ×3 IMPLANT
SET BASIN LINEN APH (SET/KITS/TRAYS/PACK) ×3 IMPLANT
SET TUBE IRRIG SUCTION NO TIP (IRRIGATION / IRRIGATOR) IMPLANT
SLEEVE ENDOPATH XCEL 5M (ENDOMECHANICALS) ×3 IMPLANT
SPONGE GAUZE 2X2 8PLY STER LF (GAUZE/BANDAGES/DRESSINGS) ×4
SPONGE GAUZE 2X2 8PLY STRL LF (GAUZE/BANDAGES/DRESSINGS) ×8 IMPLANT
STAPLER VISISTAT (STAPLE) ×3 IMPLANT
SUT VICRYL 0 UR6 27IN ABS (SUTURE) ×3 IMPLANT
TAPE CLOTH SURG 4X10 WHT LF (GAUZE/BANDAGES/DRESSINGS) ×2 IMPLANT
TROCAR ENDO BLADELESS 11MM (ENDOMECHANICALS) ×3 IMPLANT
TROCAR XCEL NON-BLD 5MMX100MML (ENDOMECHANICALS) ×3 IMPLANT
TROCAR XCEL UNIV SLVE 11M 100M (ENDOMECHANICALS) ×3 IMPLANT
TUBING INSUFFLATION (TUBING) ×3 IMPLANT
WARMER LAPAROSCOPE (MISCELLANEOUS) ×3 IMPLANT
YANKAUER SUCT 12FT TUBE ARGYLE (SUCTIONS) ×3 IMPLANT

## 2015-01-02 NOTE — Op Note (Signed)
Patient:  Nida BoatmanKrystal L Thacker  DOB:  19-May-1986  MRN:  [id number]   Preop Diagnosis:  Cholecystitis, cholelithiasis  Postop Diagnosis:  Same  Procedure:  Laparoscopic cholecystectomy  Surgeon:  Franky MachoMark Sharnay Cashion, M.D.  Anes:  Gen. endotracheal  Indications:  Patient is a 29 year old white female presents with cholecystitis secondary to cholelithiasis. The risks and benefits of the procedure including bleeding, infection, hepatobiliary injury, and the possibility of an open procedure were fully explained to the patient, who gave informed consent.  Procedure note:  The patient was placed in the supine position. After induction of general endotracheal anesthesia, the abdomen was prepped and draped using the usual sterile technique with DuraPrep. Surgical site confirmation was performed.  A supraumbilical incision was made down to the fascia. A Veress needle was introduced into the abdominal cavity and confirmation of placement was done using the saline drop test. The abdomen was then insufflated to 16 mmHg pressure. An 11 mm trocar was introduced into the abdominal cavity under direct visualization without difficulty. The patient was placed in reverse Trendelenburg position and additional 11 mm trocar was placed the epigastric region and 5 mm trochars were placed the right upper quadrant right flank regions. The liver was inspected and noted to be within normal limits. The gallbladder was retracted in a dynamic fashion in order to expose the triangle of Calot. The cystic duct was first identified. Its junction to the infundibulum was fully identified. Endoclips were placed proximally distally on the cystic duct, and cystic duct was divided. This was likewise done to the cystic artery. The gallbladder was then freed away from the gallbladder fossa using Bovie electrocautery. The gallbladder was delivered to the epigastric trocar site using an Endo Catch bag. The gallbladder fossa was inspected no abnormal  bleeding or bile leakage was noted. Surgicel is placed the gallbladder fossa. All fluid and air were then evacuated from the abdominal cavity prior to removal of the trochars.  All wounds were irrigated with normal saline. All wounds were injected with 0.5% Sensorcaine. The supraumbilical fascia as well as epigastric fascia reapproximated using 0 Vicryl interrupted sutures. All skin incisions were closed using staples. Betadine ointment and dry sterile dressings were applied.  All tape and needle counts were correct the end of the procedure. The patient was extubated in the operating room and transferred to PACU in stable condition.  Complications:  None  EBL:  Minimal  Specimen:  Gallbladder

## 2015-01-02 NOTE — Care Management Note (Signed)
Case Management Note  Patient Details  Name: Stefanie [name] MRN: [id number] Date of Birth: 1986-01-08  Subjective/Objective:                  Pt admitted from home with cholecystitis. Pt lives with her significant other and pt will return home at discharge. Pt is independent with ADL's.  Action/Plan: Pt for surgery today. Anticipate discharge within 24 hours.  Expected Discharge Date:     01/03/15             Expected Discharge Plan:  Home/Self Care  In-House Referral:  NA  Discharge planning Services  CM Consult  Post Acute Care Choice:  NA Choice offered to:  NA  DME Arranged:    DME Agency:     HH Arranged:    HH Agency:     Status of Service:  Completed, signed off  Medicare Important Message Given:    Date Medicare IM Given:    Medicare IM give by:    Date Additional Medicare IM Given:    Additional Medicare Important Message give by:     If discussed at Long Length of Stay Meetings, dates discussed:    Additional Comments:  Cheryl FlashBlackwell, Grenda Lora Crowder, RN 01/02/2015, 10:09 AM

## 2015-01-02 NOTE — Progress Notes (Signed)
Discharge instructions given on medications,and follow up visits,patient verbalized understanding. Prescription sent with patient. Vital signs stable. Accompanied by staff to an awaiting vehicle.

## 2015-01-02 NOTE — Anesthesia Postprocedure Evaluation (Signed)
  Anesthesia Post-op Note  Patient: Stefanie BoatmanKrystal L Thacker  Procedure(s) Performed: Procedure(s): LAPAROSCOPIC CHOLECYSTECTOMY (N/A)  Patient Location: PACU  Anesthesia Type:General  Level of Consciousness: awake, oriented and patient cooperative  Airway and Oxygen Therapy: Patient Spontanous Breathing and Patient connected to face mask oxygen  Post-op Pain: mild  Post-op Assessment: Post-op Vital signs reviewed, Patient's Cardiovascular Status Stable, Respiratory Function Stable, Patent Airway and Pain level controlled              Post-op Vital Signs: Reviewed and stable  Last Vitals:  Filed Vitals:   01/02/15 1210  BP: 101/67  Pulse:   Temp:   Resp: 0    Complications: No apparent anesthesia complications

## 2015-01-02 NOTE — Discharge Summary (Signed)
Physician Discharge Summary  Nida BoatmanKrystal L Thacker ZDG:[id number]:[id number] DOB: 1985/07/12 DOA: 12/31/2014  PCP: Colette RibasGOLDING, JOHN CABOT, MD  Admit date: 12/31/2014 Discharge date: 01/02/2015  Time spent: 20 minutes  Recommendations for Outpatient Follow-up:  1. Follow up with Dr. Lovell SheehanJenkins on 7/26  Discharge Diagnoses:  Principal Problem:   Cholecystitis Active Problems:   Nephrolithiasis   Hypokalemia   Constipation   Discharge Condition: improved  Diet recommendation: regular diet  Filed Weights   12/31/14 1613 12/31/14 2139  Weight: 80.287 kg (177 lb) 79.062 kg (174 lb 4.8 oz)    History of present illness:  This patient presents to the hospital with abdominal pain. She reported having several episodes in the past, most recent episode starting on the night prior to admission. Described the pain as right upper quadrant, radiates to back and right shoulder, associated with nausea and vomiting. She was admitted for further treatments.  Hospital Course:  Patient was found to have acute cholecystitis with evidence of cholelithiasis. She seemed by general surgery and underwent uncomplicated laparoscopic cholecystectomy. Following the procedure, she was feeling improved. She tolerated her diet without any vomiting. Pain was reasonably controlled with oral pain medicine. Since patient did not have any other medical issues, she was discharged home in stable condition. She will follow up with general surgery for further outpatient care.  Procedures:  Laparoscopic cholecystecomy  Consultations:  General surgery  Discharge Exam: Filed Vitals:   01/02/15 1700  BP: 97/54  Pulse: 76  Temp: 98.3 F (36.8 C)  Resp: 16    General: NAD Cardiovascular: s1, s2, rrr Respiratory: cta b  Discharge Instructions    Current Discharge Medication List    CONTINUE these medications which have CHANGED   Details  HYDROcodone-acetaminophen (NORCO/VICODIN) 5-325 MG per tablet Take 1-2 tablets by mouth  every 4 (four) hours as needed for moderate pain. Qty: 50 tablet, Refills: 0      CONTINUE these medications which have NOT CHANGED   Details  Prenatal Vit-Fe Fumarate-FA (MULTIVITAMIN-PRENATAL) 27-0.8 MG TABS tablet Take 1 tablet by mouth daily.     ibuprofen (ADVIL,MOTRIN) 600 MG tablet Take 1 tablet (600 mg total) by mouth every 6 (six) hours. Qty: 50 tablet, Refills: 1    norethindrone (MICRONOR,CAMILA,ERRIN) 0.35 MG tablet Take 1 tablet (0.35 mg total) by mouth daily. Qty: 1 Package, Refills: 11    ondansetron (ZOFRAN ODT) 4 MG disintegrating tablet Take 1 tablet (4 mg total) by mouth every 8 (eight) hours as needed. Qty: 10 tablet, Refills: 1    ondansetron (ZOFRAN) 4 MG tablet Take 1 tablet (4 mg total) by mouth every 8 (eight) hours as needed for nausea or vomiting. Qty: 6 tablet, Refills: 0       No Known Allergies Follow-up Information    Follow up with Dalia HeadingJENKINS,MARK A, MD. Schedule an appointment as soon as possible for a visit on 01/10/2015.   Specialty:  General Surgery   Contact information:   [address] [place] [id number] [phone]        The results of significant diagnostics from this hospitalization (including imaging, microbiology, ancillary and laboratory) are listed below for reference.    Significant Diagnostic Studies: Ct Abdomen Pelvis W Contrast  12/31/2014   CLINICAL DATA:  Right upper quadrant pain  EXAM: CT ABDOMEN AND PELVIS WITH CONTRAST  TECHNIQUE: Multidetector CT imaging of the abdomen and pelvis was performed using the standard protocol following bolus administration of intravenous contrast.  CONTRAST:  50mL OMNIPAQUE IOHEXOL 300 MG/ML SOLN, 100mL  OMNIPAQUE IOHEXOL 300 MG/ML SOLN  COMPARISON:  Ultrasound 12/25/2014  FINDINGS: Lung bases are unremarkable. There is mild distended gallbladder. Significant thickening of gallbladder wall up to 9 mm. Tiny calcified gallstone is noted in gallbladder fundus. There is a noncalcified  gallstone in the neck of the gallbladder measures 1.1 cm. Findings are highly suspicious for acute cholecystitis. Clinical correlation is necessary.  The pancreas, spleen and adrenal glands are unremarkable.  Kidneys are symmetrical in size and enhancement. There is nonobstructive calcified calculus in midpole of the right kidney measures 2.5 mm. No aortic aneurysm. No hydronephrosis or hydroureter.  Moderate stool noted in right colon and transverse colon. There is no pericecal inflammation. Terminal ileum is unremarkable. Normal appendix partially visualized in coronal image 32.  The uterus and adnexa are unremarkable. The urinary bladder is unremarkable.  IMPRESSION: 1. There is mild distended gallbladder. There is thickening of gallbladder wall up to 9 mm. Mild enhancement of the mucosa. Probable non calcified gallstone in gallbladder neck region measures 1.1 cm. Findings are highly suspicious for acute cholecystitis. Clinical correlation is necessary. 2. Right nonobstructive nephrolithiasis. No hydronephrosis or hydroureter. 3. No aortic aneurysm. 4. Moderate stool in right colon and transverse colon. No pericecal inflammation. Normal appendix. 5. Unremarkable uterus and ovaries.   Electronically Signed   By: Natasha Mead M.D.   On: 12/31/2014 19:12   US Abdomen Limited  12/25/2014   CLINICAL DATA:  Patient with right upper quadrant and back pain.  EXAM: US ABDOMEN LIMITED - RIGHT UPPER QUADRANT  COMPARISON:  None.  FINDINGS: Gallbladder:  Gallbladder is mildly distended however there is no wall thickening or pericholecystic fluid. Negative sonographic Murphy sign. There is a 1.2 cm non mobile stone within the gallbladder fundus. Additionally there is nonmobile echogenic material within the gallbladder fundus which may represent tumefactive sludge.  Common bile duct:  Diameter: 3 mm  Liver:  No focal lesion identified. Within normal limits in parenchymal echogenicity.  IMPRESSION: No gallbladder wall  thickening or evidence for pericholecystic fluid. No sonographic evidence to suggest acute cholecystitis.  Within the gallbladder fundus there is a large stone which is nonmobile. Additionally there is adjacent echogenic sludge within the gallbladder fundus which is nonmobile. Recommend follow-up ultrasound in 2- 3 months to assess for interval change as the echogenic material is favored to represent tumefactive sludge however given fact it is nonmobile, underlying mass cannot be entirely excluded.   Electronically Signed   By: Annia Belt M.D.   On: 12/25/2014 11:00    Microbiology: Recent Results (from the past 240 hour(s))  Surgical pcr screen     Status: None   Collection Time: 01/02/15  3:50 AM  Result Value Ref Range Status   MRSA, PCR NEGATIVE NEGATIVE Final   Staphylococcus aureus NEGATIVE NEGATIVE Final    Comment:        The Xpert SA Assay (FDA approved for NASAL specimens in patients over 67 years of age), is one component of a comprehensive surveillance program.  Test performance has been validated by [facility] for patients greater than or equal to 71 year old. It is not intended to diagnose infection nor to guide or monitor treatment.      Labs: Basic Metabolic Panel:  Recent Labs Lab 12/31/14 1700 01/01/15 0547  NA 138 140  K 3.3* 3.8  CL 103 110  CO2 26 27  GLUCOSE 103* 99  BUN 13 7  CREATININE 0.64 0.65  CALCIUM 8.7* 8.0*  MG  --  1.8   Liver Function Tests:  Recent Labs Lab 12/31/14 1700  AST 19  ALT 19  ALKPHOS 70  BILITOT 0.7  PROT 7.6  ALBUMIN 4.2    Recent Labs Lab 12/31/14 1700  LIPASE 14*   No results for input(s): AMMONIA in the last 168 hours. CBC:  Recent Labs Lab 12/31/14 1700 01/01/15 0547  WBC 9.6 4.7  NEUTROABS 7.0  --   HGB 13.4 12.2  HCT 38.3 35.4*  MCV 94.3 94.7  PLT 215 173   Cardiac Enzymes: No results for input(s): CKTOTAL, CKMB, CKMBINDEX, TROPONINI in the last 168 hours. BNP: BNP (last 3 results) No  results for input(s): BNP in the last 8760 hours.  ProBNP (last 3 results) No results for input(s): PROBNP in the last 8760 hours.  CBG: No results for input(s): GLUCAP in the last 168 hours.     Signed:  Raygen Dahm  [facility] 01/02/2015, 6:37 PM

## 2015-01-02 NOTE — Anesthesia Preprocedure Evaluation (Signed)
Anesthesia Evaluation  Patient identified by MRN, date of birth, ID band Patient awake    Reviewed: Allergy & Precautions, NPO status , Patient's Chart, lab work & pertinent test results  Airway Mallampati: I  TM Distance: >3 FB     Dental  (+) Teeth Intact, Dental Advisory Given   Pulmonary Current Smoker,  breath sounds clear to auscultation        Cardiovascular negative cardio ROS  Rhythm:Regular Rate:Normal     Neuro/Psych    GI/Hepatic negative GI ROS,   Endo/Other    Renal/GU      Musculoskeletal   Abdominal   Peds  Hematology   Anesthesia Other Findings   Reproductive/Obstetrics                             Anesthesia Physical Anesthesia Plan  ASA: I  Anesthesia Plan: General   Post-op Pain Management:    Induction: Intravenous, Rapid sequence and Cricoid pressure planned  Airway Management Planned: Oral ETT  Additional Equipment:   Intra-op Plan:   Post-operative Plan: Extubation in OR  Informed Consent: I have reviewed the patients History and Physical, chart, labs and discussed the procedure including the risks, benefits and alternatives for the proposed anesthesia with the patient or authorized representative who has indicated his/her understanding and acceptance.     Plan Discussed with:   Anesthesia Plan Comments:         Anesthesia Quick Evaluation

## 2015-01-02 NOTE — Transfer of Care (Signed)
Immediate Anesthesia Transfer of Care Note  Patient: Stefanie BoatmanKrystal L Thacker  Procedure(s) Performed: Procedure(s): LAPAROSCOPIC CHOLECYSTECTOMY (N/A)  Patient Location: PACU  Anesthesia Type:General  Level of Consciousness: awake, oriented and patient cooperative  Airway & Oxygen Therapy: Patient Spontanous Breathing and Patient connected to face mask oxygen  Post-op Assessment: Report given to RN and Post -op Vital signs reviewed and stable  Post vital signs: Reviewed and stable  Last Vitals:  Filed Vitals:   01/02/15 1210  BP: 101/67  Pulse:   Temp:   Resp: 0    Complications: No apparent anesthesia complications

## 2015-01-02 NOTE — Anesthesia Procedure Notes (Signed)
Procedure Name: Intubation Date/Time: 01/02/2015 12:26 PM Performed by: Glynn OctaveANIEL, Bless Belshe E Pre-anesthesia Checklist: Patient identified, Patient being monitored, Timeout performed, Emergency Drugs available and Suction available Patient Re-evaluated:Patient Re-evaluated prior to inductionOxygen Delivery Method: Circle System Utilized Preoxygenation: Pre-oxygenation with 100% oxygen Intubation Type: IV induction, Rapid sequence and Cricoid Pressure applied Ventilation: Mask ventilation without difficulty Laryngoscope Size: Mac and 3 Grade View: Grade I Tube type: Oral Tube size: 7.0 mm Number of attempts: 1 Airway Equipment and Method: Stylet Placement Confirmation: ETT inserted through vocal cords under direct vision,  positive ETCO2 and breath sounds checked- equal and bilateral Secured at: 21 cm Tube secured with: Tape Dental Injury: Teeth and Oropharynx as per pre-operative assessment

## 2015-01-02 NOTE — Discharge Instructions (Signed)

## 2015-01-03 ENCOUNTER — Encounter (HOSPITAL_COMMUNITY): Payer: Self-pay | Admitting: General Surgery

## 2015-01-10 ENCOUNTER — Ambulatory Visit: Payer: Managed Care, Other (non HMO) | Admitting: Gastroenterology

## 2015-06-18 NOTE — L&D Delivery Note (Signed)
30 y/o G4P3 @ 40 and 5 with SOL Augmented with AROM and Pitocin.  Delivery Note At  a viable boy was delivered via  (Presentation:cephalic ROA ;  ).  APGAR: 9, 9 Placenta status: delivered intact with gentle traction.  Cord: 3 vessel cord with the following complications:  Anesthesia:  none Episiotomy:   none Lacerations:   right labial Suture Repair: none Est. Blood Loss (mL):   100  Mom to postpartum.  Baby to Couplet care / Skin to Skin.  Stefanie [name] 02/24/2016, 8:37 PM

## 2015-06-23 ENCOUNTER — Ambulatory Visit (INDEPENDENT_AMBULATORY_CARE_PROVIDER_SITE_OTHER): Payer: 59 | Admitting: Adult Health

## 2015-06-23 ENCOUNTER — Encounter: Payer: Self-pay | Admitting: Adult Health

## 2015-06-23 VITALS — BP 124/80 | HR 94 | Ht 70.0 in | Wt 176.5 lb

## 2015-06-23 DIAGNOSIS — O3680X0 Pregnancy with inconclusive fetal viability, not applicable or unspecified: Secondary | ICD-10-CM

## 2015-06-23 DIAGNOSIS — Z3201 Encounter for pregnancy test, result positive: Secondary | ICD-10-CM

## 2015-06-23 DIAGNOSIS — N926 Irregular menstruation, unspecified: Secondary | ICD-10-CM | POA: Diagnosis not present

## 2015-06-23 DIAGNOSIS — Z3490 Encounter for supervision of normal pregnancy, unspecified, unspecified trimester: Secondary | ICD-10-CM

## 2015-06-23 HISTORY — DX: Encounter for supervision of normal pregnancy, unspecified, unspecified trimester: Z34.90

## 2015-06-23 LAB — POCT URINE PREGNANCY: Preg Test, Ur: POSITIVE — AB

## 2015-06-23 NOTE — Patient Instructions (Signed)
First Trimester of Pregnancy The first trimester of pregnancy is from week 1 until the end of week 12 (months 1 through 3). A week after a sperm fertilizes an egg, the egg will implant on the wall of the uterus. This embryo will begin to develop into a baby. Genes from you and your partner are forming the baby. The female genes determine whether the baby is a boy or a girl. At 6-8 weeks, the eyes and face are formed, and the heartbeat can be seen on ultrasound. At the end of 12 weeks, all the baby's organs are formed.  Now that you are pregnant, you will want to do everything you can to have a healthy baby. Two of the most important things are to get good prenatal care and to follow your health care provider's instructions. Prenatal care is all the medical care you receive before the baby's birth. This care will help prevent, find, and treat any problems during the pregnancy and childbirth. BODY CHANGES Your body goes through many changes during pregnancy. The changes vary from woman to woman.   You may gain or lose a couple of pounds at first.  You may feel sick to your stomach (nauseous) and throw up (vomit). If the vomiting is uncontrollable, call your health care provider.  You may tire easily.  You may develop headaches that can be relieved by medicines approved by your health care provider.  You may urinate more often. Painful urination may mean you have a bladder infection.  You may develop heartburn as a result of your pregnancy.  You may develop constipation because certain hormones are causing the muscles that push waste through your intestines to slow down.  You may develop hemorrhoids or swollen, bulging veins (varicose veins).  Your breasts may begin to grow larger and become tender. Your nipples may stick out more, and the tissue that surrounds them (areola) may become darker.  Your gums may bleed and may be sensitive to brushing and flossing.  Dark spots or blotches (chloasma,  mask of pregnancy) may develop on your face. This will likely fade after the baby is born.  Your menstrual periods will stop.  You may have a loss of appetite.  You may develop cravings for certain kinds of food.  You may have changes in your emotions from day to day, such as being excited to be pregnant or being concerned that something may go wrong with the pregnancy and baby.  You may have more vivid and strange dreams.  You may have changes in your hair. These can include thickening of your hair, rapid growth, and changes in texture. Some women also have hair loss during or after pregnancy, or hair that feels dry or thin. Your hair will most likely return to normal after your baby is born. WHAT TO EXPECT AT YOUR PRENATAL VISITS During a routine prenatal visit:  You will be weighed to make sure you and the baby are growing normally.  Your blood pressure will be taken.  Your abdomen will be measured to track your baby's growth.  The fetal heartbeat will be listened to starting around week 10 or 12 of your pregnancy.  Test results from any previous visits will be discussed. Your health care provider may ask you:  How you are feeling.  If you are feeling the baby move.  If you have had any abnormal symptoms, such as leaking fluid, bleeding, severe headaches, or abdominal cramping.  If you are using any tobacco products,   including cigarettes, chewing tobacco, and electronic cigarettes.  If you have any questions. Other tests that may be performed during your first trimester include:  Blood tests to find your blood type and to check for the presence of any previous infections. They will also be used to check for low iron levels (anemia) and Rh antibodies. Later in the pregnancy, blood tests for diabetes will be done along with other tests if problems develop.  Urine tests to check for infections, diabetes, or protein in the urine.  An ultrasound to confirm the proper growth  and development of the baby.  An amniocentesis to check for possible genetic problems.  Fetal screens for spina bifida and Down syndrome.  You may need other tests to make sure you and the baby are doing well.  HIV (human immunodeficiency virus) testing. Routine prenatal testing includes screening for HIV, unless you choose not to have this test. HOME CARE INSTRUCTIONS  Medicines  Follow your health care provider's instructions regarding medicine use. Specific medicines may be either safe or unsafe to take during pregnancy.  Take your prenatal vitamins as directed.  If you develop constipation, try taking a stool softener if your health care provider approves. Diet  Eat regular, well-balanced meals. Choose a variety of foods, such as meat or vegetable-based protein, fish, milk and low-fat dairy products, vegetables, fruits, and whole grain breads and cereals. Your health care provider will help you determine the amount of weight gain that is right for you.  Avoid raw meat and uncooked cheese. These carry germs that can cause birth defects in the baby.  Eating four or five small meals rather than three large meals a day may help relieve nausea and vomiting. If you start to feel nauseous, eating a few soda crackers can be helpful. Drinking liquids between meals instead of during meals also seems to help nausea and vomiting.  If you develop constipation, eat more high-fiber foods, such as fresh vegetables or fruit and whole grains. Drink enough fluids to keep your urine clear or pale yellow. Activity and Exercise  Exercise only as directed by your health care provider. Exercising will help you:  Control your weight.  Stay in shape.  Be prepared for labor and delivery.  Experiencing pain or cramping in the lower abdomen or low back is a good sign that you should stop exercising. Check with your health care provider before continuing normal exercises.  Try to avoid standing for long  periods of time. Move your legs often if you must stand in one place for a long time.  Avoid heavy lifting.  Wear low-heeled shoes, and practice good posture.  You may continue to have sex unless your health care provider directs you otherwise. Relief of Pain or Discomfort  Wear a good support bra for breast tenderness.   Take warm sitz baths to soothe any pain or discomfort caused by hemorrhoids. Use hemorrhoid cream if your health care provider approves.   Rest with your legs elevated if you have leg cramps or low back pain.  If you develop varicose veins in your legs, wear support hose. Elevate your feet for 15 minutes, 3-4 times a day. Limit salt in your diet. Prenatal Care  Schedule your prenatal visits by the twelfth week of pregnancy. They are usually scheduled monthly at first, then more often in the last 2 months before delivery.  Write down your questions. Take them to your prenatal visits.  Keep all your prenatal visits as directed by your   health care provider. Safety  Wear your seat belt at all times when driving.  Make a list of emergency phone numbers, including numbers for family, friends, the hospital, and police and fire departments. General Tips  Ask your health care provider for a referral to a local prenatal education class. Begin classes no later than at the beginning of month 6 of your pregnancy.  Ask for help if you have counseling or nutritional needs during pregnancy. Your health care provider can offer advice or refer you to specialists for help with various needs.  Do not use hot tubs, steam rooms, or saunas.  Do not douche or use tampons or scented sanitary pads.  Do not cross your legs for long periods of time.  Avoid cat litter boxes and soil used by cats. These carry germs that can cause birth defects in the baby and possibly loss of the fetus by miscarriage or stillbirth.  Avoid all smoking, herbs, alcohol, and medicines not prescribed by  your health care provider. Chemicals in these affect the formation and growth of the baby.  Do not use any tobacco products, including cigarettes, chewing tobacco, and electronic cigarettes. If you need help quitting, ask your health care provider. You may receive counseling support and other resources to help you quit.  Schedule a dentist appointment. At home, brush your teeth with a soft toothbrush and be gentle when you floss. SEEK MEDICAL CARE IF:   You have dizziness.  You have mild pelvic cramps, pelvic pressure, or nagging pain in the abdominal area.  You have persistent nausea, vomiting, or diarrhea.  You have a bad smelling vaginal discharge.  You have pain with urination.  You notice increased swelling in your face, hands, legs, or ankles. SEEK IMMEDIATE MEDICAL CARE IF:   You have a fever.  You are leaking fluid from your vagina.  You have spotting or bleeding from your vagina.  You have severe abdominal cramping or pain.  You have rapid weight gain or loss.  You vomit blood or material that looks like coffee grounds.  You are exposed to German measles and have never had them.  You are exposed to fifth disease or chickenpox.  You develop a severe headache.  You have shortness of breath.  You have any kind of trauma, such as from a fall or a car accident.   This information is not intended to replace advice given to you by your health care provider. Make sure you discuss any questions you have with your health care provider.   Document Released: 05/28/2001 Document Revised: 06/24/2014 Document Reviewed: 04/13/2013 Elsevier Interactive Patient Education 2016 [employer] Return in 1 week for US 

## 2015-06-23 NOTE — Progress Notes (Signed)
Subjective:     Patient ID: Stefanie [name], female   DOB: [birth year], 30 y.o.   MRN: [id number]  HPI Stefanie [name] is a 30 year old white female, married in for UPT, has missed a period and had +HPT and a 368 month old and 32 year old at home, she said she had only had sex 2 x. No bleeding or pain or nausea.   Review of Systems Patient denies any headaches, hearing loss, fatigue, blurred vision, shortness of breath, chest pain, abdominal pain, problems with bowel movements, urination, or intercourse. No joint pain or mood swings.+missed period Reviewed past medical,surgical, social and family history. Reviewed medications and allergies.     Objective:   Physical Exam BP 124/80 mmHg  Pulse 94  Ht 5\' 10"  (1.778 m)  Wt 176 lb 8 oz (80.06 kg)  BMI 25.33 kg/m2  LMP 05/07/2015  Breastfeeding? No UPT + about 6+5 weeks by LMP with EDD 02/11/16, Skin warm and dry. Neck: mid line trachea, normal thyroid, good ROM, no lymphadenopathy noted. Lungs: clear to ausculation bilaterally. Cardiovascular: regular rate and rhythm.Abdomen soft and non tender.    Assessment:    Pregnant     Plan:     Return in 1 week for dating US   Review handout on first trimester

## 2015-07-03 ENCOUNTER — Ambulatory Visit (INDEPENDENT_AMBULATORY_CARE_PROVIDER_SITE_OTHER): Payer: 59

## 2015-07-03 DIAGNOSIS — O3680X0 Pregnancy with inconclusive fetal viability, not applicable or unspecified: Secondary | ICD-10-CM

## 2015-07-03 DIAGNOSIS — Z3A01 Less than 8 weeks gestation of pregnancy: Secondary | ICD-10-CM

## 2015-07-03 NOTE — Progress Notes (Signed)
US 7wks,single IUP pos fht 134 bpm,normal ov's bilat,crl 10.8 mm

## 2015-07-12 ENCOUNTER — Encounter: Payer: 59 | Admitting: Adult Health

## 2015-07-13 ENCOUNTER — Other Ambulatory Visit (HOSPITAL_COMMUNITY)
Admission: RE | Admit: 2015-07-13 | Discharge: 2015-07-13 | Disposition: A | Discharge status: Home / Self Care | Payer: 59 | Source: Ambulatory Visit | Attending: Advanced Practice Midwife | Admitting: Advanced Practice Midwife

## 2015-07-13 ENCOUNTER — Encounter: Payer: Self-pay | Admitting: Advanced Practice Midwife

## 2015-07-13 ENCOUNTER — Ambulatory Visit (INDEPENDENT_AMBULATORY_CARE_PROVIDER_SITE_OTHER): Payer: 59 | Admitting: Advanced Practice Midwife

## 2015-07-13 VITALS — BP 102/70 | HR 96 | Wt 177.0 lb

## 2015-07-13 DIAGNOSIS — Z1151 Encounter for screening for human papillomavirus (HPV): Secondary | ICD-10-CM | POA: Diagnosis not present

## 2015-07-13 DIAGNOSIS — Z113 Encounter for screening for infections with a predominantly sexual mode of transmission: Secondary | ICD-10-CM | POA: Insufficient documentation

## 2015-07-13 DIAGNOSIS — Z331 Pregnant state, incidental: Secondary | ICD-10-CM | POA: Diagnosis not present

## 2015-07-13 DIAGNOSIS — Z3491 Encounter for supervision of normal pregnancy, unspecified, first trimester: Secondary | ICD-10-CM | POA: Diagnosis not present

## 2015-07-13 DIAGNOSIS — N898 Other specified noninflammatory disorders of vagina: Secondary | ICD-10-CM | POA: Diagnosis not present

## 2015-07-13 DIAGNOSIS — Z3682 Encounter for antenatal screening for nuchal translucency: Secondary | ICD-10-CM

## 2015-07-13 DIAGNOSIS — Z0283 Encounter for blood-alcohol and blood-drug test: Secondary | ICD-10-CM

## 2015-07-13 DIAGNOSIS — Z369 Encounter for antenatal screening, unspecified: Secondary | ICD-10-CM

## 2015-07-13 DIAGNOSIS — Z1389 Encounter for screening for other disorder: Secondary | ICD-10-CM

## 2015-07-13 DIAGNOSIS — Z124 Encounter for screening for malignant neoplasm of cervix: Secondary | ICD-10-CM

## 2015-07-13 DIAGNOSIS — Z01419 Encounter for gynecological examination (general) (routine) without abnormal findings: Secondary | ICD-10-CM | POA: Diagnosis present

## 2015-07-13 DIAGNOSIS — O09891 Supervision of other high risk pregnancies, first trimester: Secondary | ICD-10-CM

## 2015-07-13 MED ORDER — DOXYLAMINE-PYRIDOXINE 10-10 MG PO TBEC: DELAYED_RELEASE_TABLET | ORAL | Status: DC

## 2015-07-13 NOTE — Progress Notes (Signed)
Subjective:    Stefanie [name] is a [id number] [redacted]w[redacted]d being seen today for her first obstetrical visit.  Her obstetrical history is significant for short interval b/t pregnancies (has 65 month old). .  Pregnancy history fully reviewed.  Patient reports nausea. Has noticed a difference in her DC for a few days  Filed Vitals:   07/13/15 0933  BP: 102/70  Pulse: 96  Weight: 177 lb (80.287 kg)    HISTORY: OB History  Gravida Para Term Preterm AB SAB TAB Ectopic Multiple Living  0 2    # Outcome Date GA Lbr Len/2nd Weight Sex Delivery Anes PTL Lv  4 Current           3 Term 10/04/14 [redacted]w[redacted]d 21:35 / 00:16 8 lb 1.6 oz (3.675 kg) F Vag-Spont EPI  Y     Comments: none  2 Term 2006    F Vag-Spont   Y  1 SAB              Past Medical History  Diagnosis Date  . Abnormal Pap smear of cervix   . HSV-2 (herpes simplex virus 2) infection   . Hx of gonorrhea   . Herpes 10/12/2013  . Pregnant 06/23/2015   Past Surgical History  Procedure Laterality Date  . Colposcopy      colpo with biopsy  . Wisdom tooth extraction    . Cholecystectomy N/A 01/02/2015    Procedure: LAPAROSCOPIC CHOLECYSTECTOMY;  Surgeon: Franky Macho, MD;  Location: [facility];  Service: General;  Laterality: N/A;   Family History  Problem Relation Age of Onset  . Heart attack Father   . Heart disease Father   . Diabetes Maternal Uncle   . Dementia Maternal Grandmother   . Cancer Maternal Grandfather     throat, lung, bladder  . Diabetes Paternal Grandmother   . Heart attack Paternal Grandfather   . Heart disease Paternal Grandfather      Exam       Pelvic Exam:    Perineum: Normal Perineum   Vulva: normal   Vagina:  normal mucosa, normal discharge, no odor, no palpable nodules   Uterus Normal, Gravid, FH: 8     Cervix: normal   Adnexa: Not palpable   Urinary:  urethral meatus normal    System:     Skin: normal coloration and turgor, no rashes    Neurologic: oriented, normal, normal mood   Extremities: normal strength, tone, and muscle mass   HEENT PERRLA   Mouth/Teeth mucous membranes moist, good dentition   Neck supple and no masses   Cardiovascular: regular rate and rhythm   Respiratory:  appears well, vitals normal, no respiratory distress, acyanotic   Abdomen: soft, non-tender;  FHR: 160          Assessment:    Pregnancy: [id number] Patient Active Problem List   Diagnosis Date Noted  . Short interval between pregnancies 07/13/2015  . Supervision of normal pregnancy 06/23/2015  . Nephrolithiasis 12/31/2014  . Candidiasis of vulva and vagina 01/12/2014  . Herpes 10/12/2013        Plan:     Initial labs drawn. Continue prenatal vitamins  Problem list reviewed and updated  Reviewed n/v relief measures and warning s/s to report  Samples and Rx for diclegis given  Reviewed recommended weight gain based on pre-gravid BMI  Encouraged well-balanced diet Genetic Screening discussed Integrated Screen: requested.  Ultrasound discussed; fetal survey: requested.  Return in  about 4 weeks (around 08/10/2015) for US:NT+1st IT, LROB.  CRESENZO-DISHMAN,Malic Rosten 07/13/2015

## 2015-07-14 LAB — URINE CULTURE: Organism ID, Bacteria: NO GROWTH

## 2015-07-17 ENCOUNTER — Encounter: Payer: Self-pay | Admitting: *Deleted

## 2015-07-17 ENCOUNTER — Telehealth: Payer: Self-pay | Admitting: *Deleted

## 2015-07-17 LAB — CYTOLOGY - PAP

## 2015-07-17 NOTE — Progress Notes (Signed)
Patient ID: Stefanie [name], female   DOB: 1985-11-19, 30 y.o.   MRN: [id number] PA initiated for Diclegis 10-10 mg, will receive fax of outcome. Pt notified.

## 2015-07-17 NOTE — Telephone Encounter (Signed)
Pt informed PA initiated should hear back by fax outcome per pt insurance. Pt verbalized understanding.

## 2015-07-20 ENCOUNTER — Other Ambulatory Visit: Payer: Self-pay | Admitting: Advanced Practice Midwife

## 2015-07-20 MED ORDER — VITAMIN B-6 25 MG PO TABS: 25.0000 mg | ORAL_TABLET | Freq: Every day | ORAL | Status: DC

## 2015-07-20 MED ORDER — DOXYLAMINE SUCCINATE 5 MG PO CHEW: 10.0000 mg | CHEWABLE_TABLET | Freq: Every day | ORAL | Status: DC

## 2015-07-20 NOTE — Progress Notes (Signed)
Ins doensnt cover diclegis unless fails doxylamine and pyridoxine

## 2015-07-21 LAB — PMP SCREEN PROFILE (10S), URINE
Amphetamine Screen, Ur: NEGATIVE ng/mL
BARBITURATE SCRN UR: NEGATIVE ng/mL
BENZODIAZEPINE SCREEN, URINE: NEGATIVE ng/mL
Cannabinoids Ur Ql Scn: NEGATIVE ng/mL
Cocaine(Metab.)Screen, Urine: NEGATIVE ng/mL
Creatinine(Crt), U: 119.7 mg/dL (ref 20.0–300.0)
Methadone Scn, Ur: NEGATIVE ng/mL
Opiate Scrn, Ur: NEGATIVE ng/mL
Oxycodone+Oxymorphone Ur Ql Scn: NEGATIVE ng/mL
PCP Scrn, Ur: NEGATIVE ng/mL
Ph of Urine: 5.9 (ref 4.5–8.9)
Propoxyphene, Screen: NEGATIVE ng/mL

## 2015-07-21 LAB — URINALYSIS, ROUTINE W REFLEX MICROSCOPIC
BILIRUBIN UA: NEGATIVE
Glucose, UA: NEGATIVE
Ketones, UA: NEGATIVE
Leukocytes, UA: NEGATIVE
Nitrite, UA: NEGATIVE
PROTEIN UA: NEGATIVE
RBC UA: NEGATIVE
Specific Gravity, UA: 1.016 (ref 1.005–1.030)
UUROB: 0.2 mg/dL (ref 0.2–1.0)
pH, UA: 6 (ref 5.0–7.5)

## 2015-07-21 LAB — RUBELLA SCREEN: Rubella Antibodies, IGG: 1.47 index (ref >0.99)

## 2015-07-21 LAB — CBC
Hematocrit: 39.1 % (ref 34.0–46.6)
Hemoglobin: 13.5 g/dL (ref 11.1–15.9)
MCH: 32.2 pg (ref 26.6–33.0)
MCHC: 34.5 g/dL (ref 31.5–35.7)
MCV: 93 fL (ref 79–97)
Platelets: 206 10*3/uL (ref 150–379)
RBC: 4.19 x10E6/uL (ref 3.77–5.28)
RDW: 12.3 % (ref 12.3–15.4)
WBC: 6.3 10*3/uL (ref 3.4–10.8)

## 2015-07-21 LAB — HIV ANTIBODY (ROUTINE TESTING W REFLEX): HIV SCREEN 4TH GENERATION: NONREACTIVE

## 2015-07-21 LAB — CYSTIC FIBROSIS MUTATION 97: GENE DIS ANAL CARRIER INTERP BLD/T-IMP: NOT DETECTED

## 2015-07-21 LAB — HEPATITIS B SURFACE ANTIGEN: Hepatitis B Surface Ag: NEGATIVE

## 2015-07-21 LAB — RPR: RPR: NONREACTIVE

## 2015-07-21 LAB — ANTIBODY SCREEN: ANTIBODY SCREEN: NEGATIVE

## 2015-07-24 ENCOUNTER — Telehealth: Payer: Self-pay | Admitting: *Deleted

## 2015-07-24 MED ORDER — PROMETHAZINE HCL 25 MG PO TABS: 12.5000 mg | ORAL_TABLET | Freq: Four times a day (QID) | ORAL | Status: DC | PRN

## 2015-07-24 NOTE — Telephone Encounter (Signed)
Pt insurance will not cover previous medication e-scribed for nause.  Requesting Rx for Phenergan.

## 2015-08-08 ENCOUNTER — Ambulatory Visit (INDEPENDENT_AMBULATORY_CARE_PROVIDER_SITE_OTHER): Payer: 59 | Admitting: Advanced Practice Midwife

## 2015-08-08 ENCOUNTER — Encounter: Payer: Self-pay | Admitting: Advanced Practice Midwife

## 2015-08-08 ENCOUNTER — Ambulatory Visit (INDEPENDENT_AMBULATORY_CARE_PROVIDER_SITE_OTHER): Payer: 59

## 2015-08-08 VITALS — BP 120/70 | HR 92 | Wt 183.0 lb

## 2015-08-08 DIAGNOSIS — Z369 Encounter for antenatal screening, unspecified: Secondary | ICD-10-CM

## 2015-08-08 DIAGNOSIS — Z3A13 13 weeks gestation of pregnancy: Secondary | ICD-10-CM

## 2015-08-08 DIAGNOSIS — Z36 Encounter for antenatal screening of mother: Secondary | ICD-10-CM | POA: Diagnosis not present

## 2015-08-08 DIAGNOSIS — Z331 Pregnant state, incidental: Secondary | ICD-10-CM

## 2015-08-08 DIAGNOSIS — O09891 Supervision of other high risk pregnancies, first trimester: Secondary | ICD-10-CM

## 2015-08-08 DIAGNOSIS — Z1389 Encounter for screening for other disorder: Secondary | ICD-10-CM

## 2015-08-08 DIAGNOSIS — Z3491 Encounter for supervision of normal pregnancy, unspecified, first trimester: Secondary | ICD-10-CM

## 2015-08-08 DIAGNOSIS — Z3490 Encounter for supervision of normal pregnancy, unspecified, unspecified trimester: Secondary | ICD-10-CM

## 2015-08-08 DIAGNOSIS — Z3682 Encounter for antenatal screening for nuchal translucency: Secondary | ICD-10-CM

## 2015-08-08 LAB — POCT URINALYSIS DIPSTICK
Blood, UA: NEGATIVE
Glucose, UA: NEGATIVE
Ketones, UA: NEGATIVE
LEUKOCYTES UA: NEGATIVE
NITRITE UA: NEGATIVE
PROTEIN UA: NEGATIVE

## 2015-08-08 MED ORDER — PROMETHAZINE HCL 25 MG PO TABS: 25.0000 mg | ORAL_TABLET | Freq: Four times a day (QID) | ORAL | Status: DC | PRN

## 2015-08-08 NOTE — Progress Notes (Signed)
[id number] [redacted]w[redacted]d Estimated Date of Delivery: 02/19/16  Blood pressure 120/70, pulse 92, weight 183 lb (83.008 kg), last menstrual period 05/07/2015, unknown if currently breastfeeding.   BP weight and urine results all reviewed and noted.  Please refer to the obstetrical flow sheet for the fundal height and fetal heart rate documentation: [place] 12+1wks,measurements c/w dates,normal ov's bilat,NT 1.1 mm,NB present,crl 58.6 mm,fhr 173 bpm           Patient denies any bleeding and no rupture of membranes symptoms or regular contractions. Patient is without complaints. All questions were answered.  Orders Placed This Encounter  Procedures  . Maternal Screen, Integrated #1  . POCT urinalysis dipstick    Plan:  Continued routine obstetrical care, NT/IT   Return in about 4 weeks (around 09/05/2015) for LROB, 2nd IT.

## 2015-08-08 NOTE — Progress Notes (Signed)
Pt denies any problems or concerns at this time.  

## 2015-08-08 NOTE — Progress Notes (Signed)
[place] 12+1wks,measurements c/w dates,normal ov's bilat,NT 1.1 mm,NB present,crl 58.6 mm,fhr 173 bpm

## 2015-08-10 ENCOUNTER — Other Ambulatory Visit: Payer: 59

## 2015-08-10 ENCOUNTER — Encounter: Payer: 59 | Admitting: Advanced Practice Midwife

## 2015-08-10 LAB — MATERNAL SCREEN, INTEGRATED #1
CROWN RUMP LENGTH MAT SCREEN: 58.6 mm
Gest. Age on Collection Date: 12.3 weeks
MATERNAL AGE AT EDD: 32.3 a
NUMBER OF FETUSES: 1
Nuchal Translucency (NT): 1.1 mm
PAPP-A Value: 758.8 ng/mL
Weight: 183 [lb_av]

## 2015-09-05 ENCOUNTER — Encounter: Payer: Self-pay | Admitting: Women's Health

## 2015-09-05 ENCOUNTER — Ambulatory Visit (INDEPENDENT_AMBULATORY_CARE_PROVIDER_SITE_OTHER): Payer: 59 | Admitting: Women's Health

## 2015-09-05 VITALS — BP 112/66 | HR 68 | Wt 187.0 lb

## 2015-09-05 DIAGNOSIS — Z3492 Encounter for supervision of normal pregnancy, unspecified, second trimester: Secondary | ICD-10-CM

## 2015-09-05 DIAGNOSIS — Z331 Pregnant state, incidental: Secondary | ICD-10-CM

## 2015-09-05 DIAGNOSIS — Z363 Encounter for antenatal screening for malformations: Secondary | ICD-10-CM

## 2015-09-05 DIAGNOSIS — B009 Herpesviral infection, unspecified: Secondary | ICD-10-CM

## 2015-09-05 DIAGNOSIS — Z1389 Encounter for screening for other disorder: Secondary | ICD-10-CM

## 2015-09-05 DIAGNOSIS — Z3682 Encounter for antenatal screening for nuchal translucency: Secondary | ICD-10-CM

## 2015-09-05 LAB — POCT URINALYSIS DIPSTICK
Glucose, UA: NEGATIVE
KETONES UA: NEGATIVE
Leukocytes, UA: NEGATIVE
Nitrite, UA: NEGATIVE
PROTEIN UA: NEGATIVE
RBC UA: NEGATIVE

## 2015-09-05 NOTE — Progress Notes (Signed)
Low-risk OB appointment [id number] 22110w1d Estimated Date of Delivery: 02/19/16 BP 112/66 mmHg  Pulse 68  Wt 187 lb (84.823 kg)  LMP 05/07/2015  BP, weight, and urine reviewed.  Refer to obstetrical flow sheet for FH & FHR.  No fm yet. Denies cramping, lof, vb, or uti s/s. No complaints. Had flu shot 11/12 @ [facility].  Reviewed warning s/s to report. Plan:  Continue routine obstetrical care  F/U in 3wks for OB appointment and anatomy u/s 2nd IT today

## 2015-09-05 NOTE — Patient Instructions (Signed)

## 2015-09-07 LAB — MATERNAL SCREEN, INTEGRATED #2
AFP MARKER: 32.7 ng/mL
AFP MOM: 1.18
Crown Rump Length: 58.6 mm
DIA MoM: 1.3
DIA Value: 202.3 pg/mL
ESTRIOL UNCONJUGATED: 0.98 ng/mL
GEST. AGE ON COLLECTION DATE: 12.3 wk
GESTATIONAL AGE: 16.3 wk
HCG MOM: 0.8
HCG VALUE: 23.9 [IU]/mL
MATERNAL AGE AT EDD: 32.3 a
Nuchal Translucency (NT): 1.1 mm
Nuchal Translucency MoM: 0.73
Number of Fetuses: 1
PAPP-A MOM: 1.05
PAPP-A Value: 758.8 ng/mL
Test Results:: NEGATIVE
WEIGHT: 183 [lb_av]
Weight: 183 [lb_av]
uE3 MoM: 1.2

## 2015-09-26 ENCOUNTER — Ambulatory Visit (INDEPENDENT_AMBULATORY_CARE_PROVIDER_SITE_OTHER): Payer: 59

## 2015-09-26 ENCOUNTER — Ambulatory Visit (INDEPENDENT_AMBULATORY_CARE_PROVIDER_SITE_OTHER): Payer: 59 | Admitting: Women's Health

## 2015-09-26 VITALS — BP 112/60 | HR 68 | Wt 192.0 lb

## 2015-09-26 DIAGNOSIS — Z1389 Encounter for screening for other disorder: Secondary | ICD-10-CM

## 2015-09-26 DIAGNOSIS — O09891 Supervision of other high risk pregnancies, first trimester: Secondary | ICD-10-CM

## 2015-09-26 DIAGNOSIS — Z331 Pregnant state, incidental: Secondary | ICD-10-CM

## 2015-09-26 DIAGNOSIS — Z3482 Encounter for supervision of other normal pregnancy, second trimester: Secondary | ICD-10-CM

## 2015-09-26 DIAGNOSIS — O321XX1 Maternal care for breech presentation, fetus 1: Secondary | ICD-10-CM | POA: Diagnosis not present

## 2015-09-26 DIAGNOSIS — Z3A20 20 weeks gestation of pregnancy: Secondary | ICD-10-CM | POA: Diagnosis not present

## 2015-09-26 DIAGNOSIS — Z3492 Encounter for supervision of normal pregnancy, unspecified, second trimester: Secondary | ICD-10-CM

## 2015-09-26 DIAGNOSIS — Z36 Encounter for antenatal screening of mother: Secondary | ICD-10-CM | POA: Diagnosis not present

## 2015-09-26 DIAGNOSIS — Z363 Encounter for antenatal screening for malformations: Secondary | ICD-10-CM

## 2015-09-26 LAB — POCT URINALYSIS DIPSTICK
GLUCOSE UA: NEGATIVE
Ketones, UA: NEGATIVE
Leukocytes, UA: NEGATIVE
NITRITE UA: NEGATIVE
Protein, UA: NEGATIVE
RBC UA: NEGATIVE

## 2015-09-26 NOTE — Patient Instructions (Addendum)
Claritin or Zyrtec, humidifier, saline nasal spray  Second Trimester of Pregnancy The second trimester is from week 13 through week 28, months 4 through 6. The second trimester is often a time when you feel your best. Your body has also adjusted to being pregnant, and you begin to feel better physically. Usually, morning sickness has lessened or quit completely, you may have more energy, and you may have an increase in appetite. The second trimester is also a time when the fetus is growing rapidly. At the end of the sixth month, the fetus is about 9 inches long and weighs about 1 pounds. You will likely begin to feel the baby move (quickening) between 18 and 20 weeks of the pregnancy. BODY CHANGES Your body goes through many changes during pregnancy. The changes vary from woman to woman.   Your weight will continue to increase. You will notice your lower abdomen bulging out.  You may begin to get stretch marks on your hips, abdomen, and breasts.  You may develop headaches that can be relieved by medicines approved by your health care provider.  You may urinate more often because the fetus is pressing on your bladder.  You may develop or continue to have heartburn as a result of your pregnancy.  You may develop constipation because certain hormones are causing the muscles that push waste through your intestines to slow down.  You may develop hemorrhoids or swollen, bulging veins (varicose veins).  You may have back pain because of the weight gain and pregnancy hormones relaxing your joints between the bones in your pelvis and as a result of a shift in weight and the muscles that support your balance.  Your breasts will continue to grow and be tender.  Your gums may bleed and may be sensitive to brushing and flossing.  Dark spots or blotches (chloasma, mask of pregnancy) may develop on your face. This will likely fade after the baby is born.  A dark line from your belly button to the  pubic area (linea nigra) may appear. This will likely fade after the baby is born.  You may have changes in your hair. These can include thickening of your hair, rapid growth, and changes in texture. Some women also have hair loss during or after pregnancy, or hair that feels dry or thin. Your hair will most likely return to normal after your baby is born. WHAT TO EXPECT AT YOUR PRENATAL VISITS During a routine prenatal visit:  You will be weighed to make sure you and the fetus are growing normally.  Your blood pressure will be taken.  Your abdomen will be measured to track your baby's growth.  The fetal heartbeat will be listened to.  Any test results from the previous visit will be discussed. Your health care provider may ask you:  How you are feeling.  If you are feeling the baby move.  If you have had any abnormal symptoms, such as leaking fluid, bleeding, severe headaches, or abdominal cramping.  If you are using any tobacco products, including cigarettes, chewing tobacco, and electronic cigarettes.  If you have any questions. Other tests that may be performed during your second trimester include:  Blood tests that check for:  Low iron levels (anemia).  Gestational diabetes (between 24 and 28 weeks).  Rh antibodies.  Urine tests to check for infections, diabetes, or protein in the urine.  An ultrasound to confirm the proper growth and development of the baby.  An amniocentesis to check for possible  genetic problems.  Fetal screens for spina bifida and Down syndrome.  HIV (human immunodeficiency virus) testing. Routine prenatal testing includes screening for HIV, unless you choose not to have this test. HOME CARE INSTRUCTIONS   Avoid all smoking, herbs, alcohol, and unprescribed drugs. These chemicals affect the formation and growth of the baby.  Do not use any tobacco products, including cigarettes, chewing tobacco, and electronic cigarettes. If you need help  quitting, ask your health care provider. You may receive counseling support and other resources to help you quit.  Follow your health care provider's instructions regarding medicine use. There are medicines that are either safe or unsafe to take during pregnancy.  Exercise only as directed by your health care provider. Experiencing uterine cramps is a good sign to stop exercising.  Continue to eat regular, healthy meals.  Wear a good support bra for breast tenderness.  Do not use hot tubs, steam rooms, or saunas.  Wear your seat belt at all times when driving.  Avoid raw meat, uncooked cheese, cat litter boxes, and soil used by cats. These carry germs that can cause birth defects in the baby.  Take your prenatal vitamins.  Take 1500-2000 mg of calcium daily starting at the 20th week of pregnancy until you deliver your baby.  Try taking a stool softener (if your health care provider approves) if you develop constipation. Eat more high-fiber foods, such as fresh vegetables or fruit and whole grains. Drink plenty of fluids to keep your urine clear or pale yellow.  Take warm sitz baths to soothe any pain or discomfort caused by hemorrhoids. Use hemorrhoid cream if your health care provider approves.  If you develop varicose veins, wear support hose. Elevate your feet for 15 minutes, 3-4 times a day. Limit salt in your diet.  Avoid heavy lifting, wear low heel shoes, and practice good posture.  Rest with your legs elevated if you have leg cramps or low back pain.  Visit your dentist if you have not gone yet during your pregnancy. Use a soft toothbrush to brush your teeth and be gentle when you floss.  A sexual relationship may be continued unless your health care provider directs you otherwise.  Continue to go to all your prenatal visits as directed by your health care provider. SEEK MEDICAL CARE IF:   You have dizziness.  You have mild pelvic cramps, pelvic pressure, or nagging  pain in the abdominal area.  You have persistent nausea, vomiting, or diarrhea.  You have a bad smelling vaginal discharge.  You have pain with urination. SEEK IMMEDIATE MEDICAL CARE IF:   You have a fever.  You are leaking fluid from your vagina.  You have spotting or bleeding from your vagina.  You have severe abdominal cramping or pain.  You have rapid weight gain or loss.  You have shortness of breath with chest pain.  You notice sudden or extreme swelling of your face, hands, ankles, feet, or legs.  You have not felt your baby move in over an hour.  You have severe headaches that do not go away with medicine.  You have vision changes.   This information is not intended to replace advice given to you by your health care provider. Make sure you discuss any questions you have with your health care provider.   Document Released: 05/28/2001 Document Revised: 06/24/2014 Document Reviewed: 08/04/2012 Elsevier Interactive Patient Education [employer]

## 2015-09-26 NOTE — Progress Notes (Addendum)
US 19+1wks,measurements c/w dates,fhr 158 bpm,normal ov's bilat,post pl gr 0,breech,cx 3.6cm,efw 300g svp of fluid 5.6 cm,anatomy complete,no obvious abnormalities seen

## 2015-09-26 NOTE — Progress Notes (Signed)
Low-risk OB appointment [id number] [redacted]w[redacted]d Estimated Date of Delivery: 02/19/16 BP 112/60 mmHg  Pulse 68  Wt 192 lb (87.091 kg)  LMP 05/07/2015  BP, weight, and urine reviewed.  Refer to obstetrical flow sheet for FH & FHR.  Reports good fm.  Denies regular uc's, lof, vb, or uti s/s. Allergies and mild nose bleeds- zyrtec or claritin, humidifier, saline nasal spray.  Reviewed today's normal anatomy u/s, fm, warning s/s to report. Plan:  Continue routine obstetrical care  F/U in 4wks for OB appointment

## 2015-10-13 ENCOUNTER — Telehealth: Payer: Self-pay | Admitting: Obstetrics & Gynecology

## 2015-10-13 ENCOUNTER — Telehealth: Payer: Self-pay | Admitting: *Deleted

## 2015-10-13 MED ORDER — PROMETHAZINE HCL 25 MG PO TABS: 25.0000 mg | ORAL_TABLET | Freq: Four times a day (QID) | ORAL | Status: DC | PRN

## 2015-10-13 MED ORDER — FLUCONAZOLE 150 MG PO TABS: 150.0000 mg | ORAL_TABLET | Freq: Once | ORAL | Status: DC

## 2015-10-13 NOTE — Telephone Encounter (Signed)
Pt states had a yeast infection starting Sunday, has used the Monistat OTC cream and has helped some but still feels irritation.   Pt also requesting refill on Phenergan for Nausea.

## 2015-10-13 NOTE — Telephone Encounter (Signed)
Pt informed Diflucan and phenergan e-scribed.

## 2015-10-20 ENCOUNTER — Ambulatory Visit (INDEPENDENT_AMBULATORY_CARE_PROVIDER_SITE_OTHER): Payer: 59 | Admitting: Obstetrics and Gynecology

## 2015-10-20 VITALS — BP 120/80 | HR 88 | Wt 194.8 lb

## 2015-10-20 DIAGNOSIS — O09891 Supervision of other high risk pregnancies, first trimester: Secondary | ICD-10-CM

## 2015-10-20 DIAGNOSIS — Z1389 Encounter for screening for other disorder: Secondary | ICD-10-CM

## 2015-10-20 DIAGNOSIS — Z331 Pregnant state, incidental: Secondary | ICD-10-CM

## 2015-10-20 DIAGNOSIS — R109 Unspecified abdominal pain: Secondary | ICD-10-CM

## 2015-10-20 DIAGNOSIS — O9989 Other specified diseases and conditions complicating pregnancy, childbirth and the puerperium: Secondary | ICD-10-CM

## 2015-10-20 DIAGNOSIS — O26899 Other specified pregnancy related conditions, unspecified trimester: Secondary | ICD-10-CM

## 2015-10-20 LAB — POCT URINALYSIS DIPSTICK
Glucose, UA: NEGATIVE
Ketones, UA: NEGATIVE
LEUKOCYTES UA: NEGATIVE
Nitrite, UA: NEGATIVE
PROTEIN UA: NEGATIVE
RBC UA: NEGATIVE

## 2015-10-20 NOTE — Patient Instructions (Signed)

## 2015-10-20 NOTE — Progress Notes (Signed)
[id number] 2531w4d Estimated Date of Delivery: 02/19/16  Blood pressure 120/80, pulse 88, weight 194 lb 12.8 oz (88.361 kg), last menstrual period 05/07/2015, unknown if currently breastfeeding.   refer to the ob flow sheet for FH and FHR, also BP, Wt, Urine results:notable for negative  Patient reports good fetal movement, desnies any bleeding and no rupture of membranes symptoms or regular contractions. Patient complaints: intermittent lower abdominal cramping that radiates to her back and increase of vaginal discharge.Pt notes that last night she had her first episode of abdominal cramping and when the pain occurs it feels as if she has to have a bowel movement. Pt states that this morning her abdominal cramping lasted every ten minutes and have since resolved. Denies diarrhea, loose stools, dysuria, and any other symptoms.  Physical Examination:  VAGINA: normal appearing vagina with normal color and discharge, no lesions,  CERVIX: firm, closed, normal appearing cervix without discharge or lesions  Fundal height: 24 cm Fetal heart rate: 154  Questions were answered. Assessment: LROB [id number] @ 1331w4d   uterine irritability, resolved,   Plan:  Continued routine obstetrical care, follow up in 4 weeks for routine OB  F/u in 4 weeks for routine OB   By signing my name below, I, Soijett Blue, attest that this documentation has been prepared under the direction and in the presence of Tilda BurrowJohn Ilsa Bonello V, MD. Electronically Signed: Soijett Blue, ED Scribe. 10/20/2015. 10:21 AM.  I personally performed the services described in this documentation, which was SCRIBED in my presence. The recorded information has been reviewed and considered accurate. It has been edited as necessary during review. Tilda BurrowFERGUSON,Apurva Reily V, MD

## 2015-10-24 ENCOUNTER — Encounter: Payer: Self-pay | Admitting: Women's Health

## 2015-10-24 ENCOUNTER — Ambulatory Visit (INDEPENDENT_AMBULATORY_CARE_PROVIDER_SITE_OTHER): Payer: 59 | Admitting: Women's Health

## 2015-10-24 VITALS — BP 120/64 | HR 80 | Wt 197.0 lb

## 2015-10-24 DIAGNOSIS — Z1389 Encounter for screening for other disorder: Secondary | ICD-10-CM

## 2015-10-24 DIAGNOSIS — Z331 Pregnant state, incidental: Secondary | ICD-10-CM

## 2015-10-24 DIAGNOSIS — Z3492 Encounter for supervision of normal pregnancy, unspecified, second trimester: Secondary | ICD-10-CM

## 2015-10-24 LAB — POCT URINALYSIS DIPSTICK
Blood, UA: NEGATIVE
Glucose, UA: NEGATIVE
Ketones, UA: NEGATIVE
LEUKOCYTES UA: NEGATIVE
NITRITE UA: NEGATIVE

## 2015-10-24 NOTE — Progress Notes (Signed)
Low-risk OB appointment [id number] 8274w1d Estimated Date of Delivery: 02/19/16 BP 120/64 mmHg  Pulse 80  Wt 197 lb (89.359 kg)  LMP 05/07/2015  BP, weight, and urine reviewed.  Refer to obstetrical flow sheet for FH & FHR.  Reports good fm.  Denies regular uc's, lof, vb, or uti s/s. No complaints. Reviewed ptl s/s, fm. Plan:  Continue routine obstetrical care  F/U in 4wks for OB appointment and pn2

## 2015-10-24 NOTE — Patient Instructions (Signed)
You will have your sugar test next visit.  Please do not eat or drink anything after midnight the night before you come, not even water.  You will be here for at least two hours.     Call the office ([phone]) or go to [facility] if:  You begin to have strong, frequent contractions  Your water breaks.  Sometimes it is a big gush of fluid, sometimes it is just a trickle that keeps getting your panties wet or running down your legs  You have vaginal bleeding.  It is normal to have a small amount of spotting if your cervix was checked.   You don't feel your baby moving like normal.  If you don't, get you something to eat and drink and lay down and focus on feeling your baby move.   If your baby is still not moving like normal, you should call the office or go to [facility].  Second Trimester of Pregnancy The second trimester is from week 13 through week 28, months 4 through 6. The second trimester is often a time when you feel your best. Your body has also adjusted to being pregnant, and you begin to feel better physically. Usually, morning sickness has lessened or quit completely, you may have more energy, and you may have an increase in appetite. The second trimester is also a time when the fetus is growing rapidly. At the end of the sixth month, the fetus is about 9 inches long and weighs about 1 pounds. You will likely begin to feel the baby move (quickening) between 18 and 20 weeks of the pregnancy. BODY CHANGES Your body goes through many changes during pregnancy. The changes vary from woman to woman.   Your weight will continue to increase. You will notice your lower abdomen bulging out.  You may begin to get stretch marks on your hips, abdomen, and breasts.  You may develop headaches that can be relieved by medicines approved by your health care provider.  You may urinate more often because the fetus is pressing on your bladder.  You may develop or continue to have  heartburn as a result of your pregnancy.  You may develop constipation because certain hormones are causing the muscles that push waste through your intestines to slow down.  You may develop hemorrhoids or swollen, bulging veins (varicose veins).  You may have back pain because of the weight gain and pregnancy hormones relaxing your joints between the bones in your pelvis and as a result of a shift in weight and the muscles that support your balance.  Your breasts will continue to grow and be tender.  Your gums may bleed and may be sensitive to brushing and flossing.  Dark spots or blotches (chloasma, mask of pregnancy) may develop on your face. This will likely fade after the baby is born.  A dark line from your belly button to the pubic area (linea nigra) may appear. This will likely fade after the baby is born.  You may have changes in your hair. These can include thickening of your hair, rapid growth, and changes in texture. Some women also have hair loss during or after pregnancy, or hair that feels dry or thin. Your hair will most likely return to normal after your baby is born. WHAT TO EXPECT AT YOUR PRENATAL VISITS During a routine prenatal visit:  You will be weighed to make sure you and the fetus are growing normally.  Your blood pressure will be taken.    Your abdomen will be measured to track your baby's growth.  The fetal heartbeat will be listened to.  Any test results from the previous visit will be discussed. Your health care provider may ask you:  How you are feeling.  If you are feeling the baby move.  If you have had any abnormal symptoms, such as leaking fluid, bleeding, severe headaches, or abdominal cramping.  If you have any questions. Other tests that may be performed during your second trimester include:  Blood tests that check for:  Low iron levels (anemia).  Gestational diabetes (between 24 and 28 weeks).  Rh antibodies.  Urine tests to check  for infections, diabetes, or protein in the urine.  An ultrasound to confirm the proper growth and development of the baby.  An amniocentesis to check for possible genetic problems.  Fetal screens for spina bifida and Down syndrome. HOME CARE INSTRUCTIONS   Avoid all smoking, herbs, alcohol, and unprescribed drugs. These chemicals affect the formation and growth of the baby.  Follow your health care provider's instructions regarding medicine use. There are medicines that are either safe or unsafe to take during pregnancy.  Exercise only as directed by your health care provider. Experiencing uterine cramps is a good sign to stop exercising.  Continue to eat regular, healthy meals.  Wear a good support bra for breast tenderness.  Do not use hot tubs, steam rooms, or saunas.  Wear your seat belt at all times when driving.  Avoid raw meat, uncooked cheese, cat litter boxes, and soil used by cats. These carry germs that can cause birth defects in the baby.  Take your prenatal vitamins.  Try taking a stool softener (if your health care provider approves) if you develop constipation. Eat more high-fiber foods, such as fresh vegetables or fruit and whole grains. Drink plenty of fluids to keep your urine clear or pale yellow.  Take warm sitz baths to soothe any pain or discomfort caused by hemorrhoids. Use hemorrhoid cream if your health care provider approves.  If you develop varicose veins, wear support hose. Elevate your feet for 15 minutes, 3-4 times a day. Limit salt in your diet.  Avoid heavy lifting, wear low heel shoes, and practice good posture.  Rest with your legs elevated if you have leg cramps or low back pain.  Visit your dentist if you have not gone yet during your pregnancy. Use a soft toothbrush to brush your teeth and be gentle when you floss.  A sexual relationship may be continued unless your health care provider directs you otherwise.  Continue to go to all your  prenatal visits as directed by your health care provider. SEEK MEDICAL CARE IF:   You have dizziness.  You have mild pelvic cramps, pelvic pressure, or nagging pain in the abdominal area.  You have persistent nausea, vomiting, or diarrhea.  You have a bad smelling vaginal discharge.  You have pain with urination. SEEK IMMEDIATE MEDICAL CARE IF:   You have a fever.  You are leaking fluid from your vagina.  You have spotting or bleeding from your vagina.  You have severe abdominal cramping or pain.  You have rapid weight gain or loss.  You have shortness of breath with chest pain.  You notice sudden or extreme swelling of your face, hands, ankles, feet, or legs.  You have not felt your baby move in over an hour.  You have severe headaches that do not go away with medicine.  You have vision changes.   Document Released: 05/28/2001 Document Revised: 06/08/2013 Document Reviewed: 08/04/2012 [employer] Patient Information 2015 [employer]. This information is not intended to replace advice given to you by your health care provider. Make sure you discuss any questions you have with your health care provider.     

## 2015-11-07 ENCOUNTER — Other Ambulatory Visit: Payer: 59

## 2015-11-07 ENCOUNTER — Encounter: Payer: Self-pay | Admitting: Obstetrics & Gynecology

## 2015-11-07 ENCOUNTER — Ambulatory Visit (INDEPENDENT_AMBULATORY_CARE_PROVIDER_SITE_OTHER): Payer: 59 | Admitting: Obstetrics & Gynecology

## 2015-11-07 VITALS — BP 100/80 | HR 80 | Wt 201.0 lb

## 2015-11-07 DIAGNOSIS — Z369 Encounter for antenatal screening, unspecified: Secondary | ICD-10-CM

## 2015-11-07 DIAGNOSIS — Z3492 Encounter for supervision of normal pregnancy, unspecified, second trimester: Secondary | ICD-10-CM

## 2015-11-07 DIAGNOSIS — Z331 Pregnant state, incidental: Secondary | ICD-10-CM

## 2015-11-07 DIAGNOSIS — Z3A26 26 weeks gestation of pregnancy: Secondary | ICD-10-CM

## 2015-11-07 DIAGNOSIS — Z3482 Encounter for supervision of other normal pregnancy, second trimester: Secondary | ICD-10-CM

## 2015-11-07 DIAGNOSIS — Z1389 Encounter for screening for other disorder: Secondary | ICD-10-CM

## 2015-11-07 LAB — POCT URINALYSIS DIPSTICK
Blood, UA: NEGATIVE
Glucose, UA: NEGATIVE
KETONES UA: NEGATIVE
LEUKOCYTES UA: NEGATIVE
Nitrite, UA: NEGATIVE
Protein, UA: NEGATIVE

## 2015-11-07 NOTE — Progress Notes (Signed)
[id number] 5444w1d Estimated Date of Delivery: 02/19/16  Blood pressure 100/80, pulse 80, weight 201 lb (91.173 kg), last menstrual period 05/07/2015, unknown if currently breastfeeding.   BP weight and urine results all reviewed and noted.  Please refer to the obstetrical flow sheet for the fundal height and fetal heart rate documentation:  Patient reports good fetal movement, denies any bleeding and no rupture of membranes symptoms or regular contractions. Patient is without complaints. All questions were answered.  Orders Placed This Encounter  Procedures  . POCT urinalysis dipstick    Plan:  Continued routine obstetrical care, PN2 today  Return in about 3 weeks (around 11/28/2015) for LROB.

## 2015-11-08 LAB — CBC
HEMOGLOBIN: 12.3 g/dL (ref 11.1–15.9)
Hematocrit: 35.4 % (ref 34.0–46.6)
MCH: 33.4 pg — AB (ref 26.6–33.0)
MCHC: 34.7 g/dL (ref 31.5–35.7)
MCV: 96 fL (ref 79–97)
Platelets: 253 10*3/uL (ref 150–379)
RBC: 3.68 x10E6/uL — AB (ref 3.77–5.28)
RDW: 12.3 % (ref 12.3–15.4)
WBC: 7.3 10*3/uL (ref 3.4–10.8)

## 2015-11-08 LAB — GLUCOSE TOLERANCE, 2 HOURS W/ 1HR
GLUCOSE, 1 HOUR: 98 mg/dL (ref 65–179)
GLUCOSE, 2 HOUR: 65 mg/dL (ref 65–152)
Glucose, Fasting: 78 mg/dL (ref 65–91)

## 2015-11-08 LAB — HIV ANTIBODY (ROUTINE TESTING W REFLEX): HIV Screen 4th Generation wRfx: NONREACTIVE

## 2015-11-08 LAB — RPR: RPR Ser Ql: NONREACTIVE

## 2015-11-08 LAB — ANTIBODY SCREEN: Antibody Screen: NEGATIVE

## 2015-11-29 ENCOUNTER — Encounter: Payer: Self-pay | Admitting: Women's Health

## 2015-11-29 ENCOUNTER — Ambulatory Visit (INDEPENDENT_AMBULATORY_CARE_PROVIDER_SITE_OTHER): Payer: 59 | Admitting: Women's Health

## 2015-11-29 VITALS — BP 110/70 | HR 88 | Wt 203.0 lb

## 2015-11-29 DIAGNOSIS — Z1389 Encounter for screening for other disorder: Secondary | ICD-10-CM

## 2015-11-29 DIAGNOSIS — Z3493 Encounter for supervision of normal pregnancy, unspecified, third trimester: Secondary | ICD-10-CM

## 2015-11-29 DIAGNOSIS — Z331 Pregnant state, incidental: Secondary | ICD-10-CM

## 2015-11-29 LAB — POCT URINALYSIS DIPSTICK
Glucose, UA: NEGATIVE
KETONES UA: NEGATIVE
Leukocytes, UA: NEGATIVE
Nitrite, UA: NEGATIVE
RBC UA: NEGATIVE

## 2015-11-29 NOTE — Progress Notes (Signed)
Low-risk OB appointment [id number] 5640w2d Estimated Date of Delivery: 02/19/16 BP 110/70 mmHg  Pulse 88  Wt 203 lb (92.08 kg)  LMP 05/07/2015  BP, weight, and urine reviewed.  Refer to obstetrical flow sheet for FH & FHR.  Reports good fm.  Denies regular uc's, lof, vb, or uti s/s. No complaints. Reviewed ptl s/s, fkc, pn2 results. Recommended Tdap at HD/PCP per CDC guidelines.  Plan:  Continue routine obstetrical care  F/U in 4wks for OB appointment

## 2015-11-29 NOTE — Patient Instructions (Signed)

## 2015-12-06 DIAGNOSIS — Z029 Encounter for administrative examinations, unspecified: Secondary | ICD-10-CM

## 2015-12-13 ENCOUNTER — Telehealth: Payer: Self-pay | Admitting: *Deleted

## 2015-12-13 NOTE — Telephone Encounter (Addendum)
Pt c/o productive cough. Pt states OTC medication not helping. Pt informed per Janan RidgeFran Crezenso-Dishmon, CNM cough could linger up to 6-8 weeks, can take OTC Antihistamines.    Pt requesting refill on Phenergan.

## 2015-12-20 ENCOUNTER — Other Ambulatory Visit: Payer: Self-pay | Admitting: Obstetrics & Gynecology

## 2015-12-21 ENCOUNTER — Encounter: Payer: Self-pay | Admitting: *Deleted

## 2015-12-21 NOTE — Progress Notes (Signed)
Patient ID: Stefanie PriestKrystal L Pangborn, female   DOB: 1986/02/23, 30 y.o.   MRN: [id number]  Pt called in complaining of lower back pain.  Pt stated she feels like she is going to [place] out.  [redacted] wks pregnant.  Discussed with nurse Marylu Lund(Janet).  [facility].  12-21-15  AS

## 2015-12-27 ENCOUNTER — Encounter: Payer: Self-pay | Admitting: Obstetrics & Gynecology

## 2015-12-27 ENCOUNTER — Ambulatory Visit (INDEPENDENT_AMBULATORY_CARE_PROVIDER_SITE_OTHER): Payer: 59 | Admitting: Obstetrics & Gynecology

## 2015-12-27 VITALS — BP 100/60 | HR 80 | Wt 204.4 lb

## 2015-12-27 DIAGNOSIS — Z331 Pregnant state, incidental: Secondary | ICD-10-CM

## 2015-12-27 DIAGNOSIS — Z3483 Encounter for supervision of other normal pregnancy, third trimester: Secondary | ICD-10-CM

## 2015-12-27 DIAGNOSIS — Z3493 Encounter for supervision of normal pregnancy, unspecified, third trimester: Secondary | ICD-10-CM

## 2015-12-27 DIAGNOSIS — Z1389 Encounter for screening for other disorder: Secondary | ICD-10-CM

## 2015-12-27 DIAGNOSIS — Z3A33 33 weeks gestation of pregnancy: Secondary | ICD-10-CM

## 2015-12-27 LAB — POCT URINALYSIS DIPSTICK
GLUCOSE UA: NEGATIVE
Ketones, UA: NEGATIVE
Leukocytes, UA: NEGATIVE
Nitrite, UA: NEGATIVE
Protein, UA: NEGATIVE
RBC UA: NEGATIVE

## 2015-12-27 NOTE — Progress Notes (Signed)
[id number] 341w2d Estimated Date of Delivery: 02/19/16  Blood pressure 100/60, pulse 80, weight 204 lb 6.4 oz (92.715 kg), last menstrual period 05/07/2015, unknown if currently breastfeeding.   BP weight and urine results all reviewed and noted.  Please refer to the obstetrical flow sheet for the fundal height and fetal heart rate documentation:  Patient reports good fetal movement, denies any bleeding and no rupture of membranes symptoms or regular contractions. Patient is without complaints. All questions were answered.  Orders Placed This Encounter  Procedures  . POCT urinalysis dipstick    Plan:  Continued routine obstetrical care, loose stools 2-3 per day, no antibiotics recently no unusual travel no one else is sick, does not feel sick Recommend pepto bismol, imodium ad prn  No Follow-up on file.

## 2016-01-10 ENCOUNTER — Encounter: Payer: Self-pay | Admitting: Women's Health

## 2016-01-10 ENCOUNTER — Ambulatory Visit (INDEPENDENT_AMBULATORY_CARE_PROVIDER_SITE_OTHER): Payer: 59 | Admitting: Women's Health

## 2016-01-10 VITALS — BP 116/60 | HR 68 | Wt 205.0 lb

## 2016-01-10 DIAGNOSIS — Z331 Pregnant state, incidental: Secondary | ICD-10-CM

## 2016-01-10 DIAGNOSIS — Z3483 Encounter for supervision of other normal pregnancy, third trimester: Secondary | ICD-10-CM

## 2016-01-10 DIAGNOSIS — O9852 Other viral diseases complicating childbirth: Secondary | ICD-10-CM

## 2016-01-10 DIAGNOSIS — Z1389 Encounter for screening for other disorder: Secondary | ICD-10-CM

## 2016-01-10 DIAGNOSIS — Z3493 Encounter for supervision of normal pregnancy, unspecified, third trimester: Secondary | ICD-10-CM

## 2016-01-10 DIAGNOSIS — B009 Herpesviral infection, unspecified: Secondary | ICD-10-CM

## 2016-01-10 DIAGNOSIS — Z3A34 34 weeks gestation of pregnancy: Secondary | ICD-10-CM

## 2016-01-10 LAB — POCT URINALYSIS DIPSTICK
Blood, UA: NEGATIVE
GLUCOSE UA: NEGATIVE
KETONES UA: NEGATIVE
Leukocytes, UA: NEGATIVE
Nitrite, UA: NEGATIVE
Protein, UA: NEGATIVE

## 2016-01-10 MED ORDER — ACYCLOVIR 400 MG PO TABS: 400.0000 mg | ORAL_TABLET | Freq: Three times a day (TID) | ORAL | 3 refills | Status: DC

## 2016-01-10 NOTE — Progress Notes (Signed)
Low-risk OB appointment [id number] [redacted]w[redacted]d Estimated Date of Delivery: 02/19/16 BP 116/60   Pulse 68   Wt 205 lb (93 kg)   LMP 05/07/2015   BMI 29.41 kg/m   BP, weight, and urine reviewed.  Refer to obstetrical flow sheet for FH & FHR.  Reports good fm.  Denies regular uc's, lof, vb, or uti s/s. Has felt like she was going to [place] out a few times- discussed and gave printed info on dizzy/passing out spells. 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 Still undecided about salpingectomy vs. Vasectomy, but knows will definitely be one of the 2- does not have [place] at all.  Reviewed ptl s/s, fkc. Plan:  Continue routine obstetrical care  F/U in 2wks for OB appointment  Rx acyclovir tid to begin now for HSV2 suppression

## 2016-01-10 NOTE — Patient Instructions (Signed)
Call the office [phone]) or go to [facility] if:  You begin to have strong, frequent contractions  Your water breaks.  Sometimes it is a big gush of fluid, sometimes it is just a trickle that keeps getting your panties wet or running down your legs  You have vaginal bleeding.  It is normal to have a small amount of spotting if your cervix was checked.   You don't feel your baby moving like normal.  If you don't, get you something to eat and drink and lay down and focus on feeling your baby move.  You should feel at least 10 movements in 2 hours.  If you don't, you should call the office or go to [facility].     For Dizzy Spells:   This is usually related to either your blood sugar or your blood pressure dropping  Make sure you are staying well hydrated and drinking enough water so that your urine is clear  Eat small frequent meals and snacks containing protein (meat, eggs, nuts, cheese) so that your blood sugar doesn't drop  If you do get dizzy, sit/lay down and get you something to drink and a snack containing protein- you will usually start feeling better in 10-20 minutes    Preterm Labor Information Preterm labor is when labor starts at less than 37 weeks of pregnancy. The normal length of a pregnancy is 39 to 41 weeks. CAUSES Often, there is no identifiable underlying cause as to why a woman goes into preterm labor. One of the most common known causes of preterm labor is infection. Infections of the uterus, cervix, vagina, amniotic sac, bladder, kidney, or even the lungs (pneumonia) can cause labor to start. Other suspected causes of preterm labor include:   Urogenital infections, such as yeast infections and bacterial vaginosis.   Uterine abnormalities (uterine shape, uterine septum, fibroids, or bleeding from the placenta).   A cervix that has been operated on (it may fail to stay closed).   Malformations in the fetus.   Multiple gestations (twins, triplets,  and so on).   Breakage of the amniotic sac.  RISK FACTORS  Having a previous history of preterm labor.   Having premature rupture of membranes (PROM).   Having a placenta that covers the opening of the cervix (placenta previa).   Having a placenta that separates from the uterus (placental abruption).   Having a cervix that is too weak to hold the fetus in the uterus (incompetent cervix).   Having too much fluid in the amniotic sac (polyhydramnios).   Taking illegal drugs or smoking while pregnant.   Not gaining enough weight while pregnant.   Being younger than 56 and older than 32 years old.   Having a low socioeconomic status.   Being African American. SYMPTOMS Signs and symptoms of preterm labor include:   Menstrual-like cramps, abdominal pain, or back pain.  Uterine contractions that are regular, as frequent as six in an hour, regardless of their intensity (may be mild or painful).  Contractions that start on the top of the uterus and spread down to the lower abdomen and back.   A sense of increased pelvic pressure.   A watery or bloody mucus discharge that comes from the vagina.  TREATMENT Depending on the length of the pregnancy and other circumstances, your health care provider may suggest bed rest. If necessary, there are medicines that can be given to stop contractions and to mature the fetal lungs. If labor happens before 83  weeks of pregnancy, a prolonged hospital stay may be recommended. Treatment depends on the condition of both you and the fetus.  WHAT SHOULD YOU DO IF YOU THINK YOU ARE IN PRETERM LABOR? Call your health care provider right away. You will need to go to the hospital to get checked immediately. HOW CAN YOU PREVENT PRETERM LABOR IN FUTURE PREGNANCIES? You should:   Stop smoking if you smoke.  Maintain healthy weight gain and avoid chemicals and drugs that are not necessary.  Be watchful for any type of infection.  Inform  your health care provider if you have a known history of preterm labor.   This information is not intended to replace advice given to you by your health care provider. Make sure you discuss any questions you have with your health care provider.   Document Released: 08/24/2003 Document Revised: 02/03/2013 Document Reviewed: 07/06/2012 Elsevier Interactive Patient Education [employer]

## 2016-01-24 ENCOUNTER — Ambulatory Visit (INDEPENDENT_AMBULATORY_CARE_PROVIDER_SITE_OTHER): Payer: 59 | Admitting: Advanced Practice Midwife

## 2016-01-24 ENCOUNTER — Encounter: Payer: Self-pay | Admitting: Advanced Practice Midwife

## 2016-01-24 VITALS — BP 110/82 | HR 88 | Wt 207.0 lb

## 2016-01-24 DIAGNOSIS — Z1159 Encounter for screening for other viral diseases: Secondary | ICD-10-CM

## 2016-01-24 DIAGNOSIS — Z331 Pregnant state, incidental: Secondary | ICD-10-CM

## 2016-01-24 DIAGNOSIS — Z1389 Encounter for screening for other disorder: Secondary | ICD-10-CM

## 2016-01-24 DIAGNOSIS — Z3685 Encounter for antenatal screening for Streptococcus B: Secondary | ICD-10-CM

## 2016-01-24 DIAGNOSIS — Z118 Encounter for screening for other infectious and parasitic diseases: Secondary | ICD-10-CM

## 2016-01-24 DIAGNOSIS — Z3493 Encounter for supervision of normal pregnancy, unspecified, third trimester: Secondary | ICD-10-CM

## 2016-01-24 LAB — POCT URINALYSIS DIPSTICK
Glucose, UA: NEGATIVE
KETONES UA: NEGATIVE
Leukocytes, UA: NEGATIVE
NITRITE UA: NEGATIVE
RBC UA: NEGATIVE

## 2016-01-24 MED ORDER — PROMETHAZINE HCL 25 MG PO TABS: 25.0000 mg | ORAL_TABLET | Freq: Four times a day (QID) | ORAL | 3 refills | Status: DC | PRN

## 2016-01-24 MED ORDER — VALACYCLOVIR HCL 500 MG PO TABS: 500.0000 mg | ORAL_TABLET | Freq: Two times a day (BID) | ORAL | 6 refills | Status: DC

## 2016-01-24 NOTE — Progress Notes (Signed)
[id number] 7059w2d Estimated Date of Delivery: 02/19/16  Blood pressure 110/82, pulse 88, weight 207 lb (93.9 kg), last menstrual period 05/07/2015, unknown if currently breastfeeding.   BP weight and urine results all reviewed and noted.  Please refer to the obstetrical flow sheet for the fundal height and fetal heart rate documentation:  Patient reports good fetal movement, denies any bleeding and no rupture of membranes symptoms or regular contractions. Patient is without complaints. All questions were answered.  Orders Placed This Encounter  Procedures  . Strep Gp B NAA  . GC/Chlamydia Probe Amp  . POCT urinalysis dipstick    Plan:  Continued routine obstetrical care,   Return in about 1 week (around 01/31/2016) for LROB.

## 2016-01-26 LAB — STREP GP B NAA: Strep Gp B NAA: NEGATIVE

## 2016-01-26 LAB — GC/CHLAMYDIA PROBE AMP
Chlamydia trachomatis, NAA: NEGATIVE
NEISSERIA GONORRHOEAE BY PCR: NEGATIVE

## 2016-01-31 ENCOUNTER — Encounter: Payer: Self-pay | Admitting: Obstetrics and Gynecology

## 2016-01-31 ENCOUNTER — Ambulatory Visit (INDEPENDENT_AMBULATORY_CARE_PROVIDER_SITE_OTHER): Payer: 59 | Admitting: Obstetrics and Gynecology

## 2016-01-31 VITALS — BP 100/70 | HR 78 | Wt 206.0 lb

## 2016-01-31 DIAGNOSIS — Z331 Pregnant state, incidental: Secondary | ICD-10-CM

## 2016-01-31 DIAGNOSIS — Z1389 Encounter for screening for other disorder: Secondary | ICD-10-CM

## 2016-01-31 DIAGNOSIS — Z3A38 38 weeks gestation of pregnancy: Secondary | ICD-10-CM

## 2016-01-31 DIAGNOSIS — Z3483 Encounter for supervision of other normal pregnancy, third trimester: Secondary | ICD-10-CM

## 2016-01-31 NOTE — Progress Notes (Signed)
Patient ID: Stefanie [name], female   DOB: 1985/09/08, 30 y.o.   MRN: [id number]  [id number]  Estimated Date of Delivery: 02/19/16 LROB 6533w2d  Blood pressure 100/70, pulse 78, weight 206 lb (93.4 kg), last menstrual period 05/07/2015, unknown if currently breastfeeding.    Urine results: notable for none  Chief Complaint  Patient presents with  . Routine Prenatal Visit    A lot of vaginal pressure.    Patient complaints: She complains of moderate vaginal pressure today. She does note she noticed some mucous when she urinated this morning, but denies gush of fluids. Pt states she and her partner are considering either vasectomy or BTL after delivery.   Patient reports good fetal movement. She denies any bleeding, rupture of membranes, or regular contractions.   Refer to the ob flow sheet for FH and FHR.    Physical Examination: General appearance - alert, well appearing, and in no distress                                      Abdomen - FH 35 cm,                                                         -FHR 148 bpm                                                         soft, nontender, nondistended, no masses or organomegaly                                      Pelvic - VULVA: normal appearing vulva with no masses, tenderness or lesions,      VAGINA: normal appearing vagina with normal color and discharge, no lesions,      CERVIX: normal appearing cervix without discharge or lesions, fetus vertex, posterior, closed, long   Discussed with pt risks and benefits of BTL. Discussed using clips only vs bilateral salpingectomy. Advised pt that bilateral salpingectomy is a permanent procedure, but reduces the risk of cancer by 2/3. At end of discussion, pt had opportunity to ask questions and has no further questions at this time.   Greater than 50% was spent in counseling and coordination of care with the patient. Total time greater than: 15 minutes                                               Questions were answered. Assessment: LROB [id number] @ 8033w2d GBS negative on 01/24/16  Discussed process of vasectomy with pt   Plan:  Continued routine obstetrical care  F/u in 1 week for routine prenatal care    By signing my name below, I, Doreatha MartinEva Mathews, attest that this documentation has been prepared under the direction and in the presence of Tilda BurrowJohn V Kynzee Devinney, MD. Electronically Signed: Doreatha MartinEva Mathews, ED Scribe. 01/31/16.  10:02 AM.  I personally performed the services described in this documentation, which was SCRIBED in my presence. The recorded information has been reviewed and considered accurate. It has been edited as necessary during review. Tilda BurrowFERGUSON,Bradly Sangiovanni V, MD

## 2016-02-02 ENCOUNTER — Encounter (HOSPITAL_COMMUNITY): Payer: Self-pay | Admitting: *Deleted

## 2016-02-02 ENCOUNTER — Telehealth: Payer: Self-pay | Admitting: Women's Health

## 2016-02-02 ENCOUNTER — Inpatient Hospital Stay (HOSPITAL_COMMUNITY)
Admission: AD | Admit: 2016-02-02 | Discharge: 2016-02-02 | Disposition: A | Discharge status: Home / Self Care | Payer: 59 | Source: Ambulatory Visit | Attending: Obstetrics and Gynecology | Admitting: Obstetrics and Gynecology

## 2016-02-02 DIAGNOSIS — O99333 Smoking (tobacco) complicating pregnancy, third trimester: Secondary | ICD-10-CM | POA: Diagnosis not present

## 2016-02-02 DIAGNOSIS — R197 Diarrhea, unspecified: Secondary | ICD-10-CM | POA: Insufficient documentation

## 2016-02-02 DIAGNOSIS — O471 False labor at or after 37 completed weeks of gestation: Secondary | ICD-10-CM | POA: Insufficient documentation

## 2016-02-02 DIAGNOSIS — Z9049 Acquired absence of other specified parts of digestive tract: Secondary | ICD-10-CM | POA: Diagnosis not present

## 2016-02-02 DIAGNOSIS — Z3A37 37 weeks gestation of pregnancy: Secondary | ICD-10-CM | POA: Diagnosis not present

## 2016-02-02 DIAGNOSIS — O26893 Other specified pregnancy related conditions, third trimester: Secondary | ICD-10-CM | POA: Insufficient documentation

## 2016-02-02 DIAGNOSIS — O26899 Other specified pregnancy related conditions, unspecified trimester: Secondary | ICD-10-CM

## 2016-02-02 DIAGNOSIS — O9989 Other specified diseases and conditions complicating pregnancy, childbirth and the puerperium: Secondary | ICD-10-CM | POA: Insufficient documentation

## 2016-02-02 DIAGNOSIS — R109 Unspecified abdominal pain: Secondary | ICD-10-CM | POA: Diagnosis not present

## 2016-02-02 DIAGNOSIS — Z79899 Other long term (current) drug therapy: Secondary | ICD-10-CM | POA: Diagnosis not present

## 2016-02-02 LAB — URINALYSIS, ROUTINE W REFLEX MICROSCOPIC
BILIRUBIN URINE: NEGATIVE
Glucose, UA: NEGATIVE mg/dL
HGB URINE DIPSTICK: NEGATIVE
KETONES UR: NEGATIVE mg/dL
Leukocytes, UA: NEGATIVE
NITRITE: NEGATIVE
PROTEIN: NEGATIVE mg/dL
Specific Gravity, Urine: 1.01 (ref 1.005–1.030)
pH: 7 (ref 5.0–8.0)

## 2016-02-02 MED ORDER — ONDANSETRON 8 MG PO TBDP
8.0000 mg | ORAL_TABLET | Freq: Once | ORAL | Status: AC
  Administered 2016-02-02: 8 mg via ORAL
  Filled 2016-02-02: qty 1

## 2016-02-02 NOTE — MAU Note (Signed)
Pt presents to MAU with complaints of diarrhea and pelvic pressure since 2 this morning. Denies any vaginal bleeding or abnormal discharge

## 2016-02-02 NOTE — MAU Provider Note (Signed)
History     CSN: [id number]  Arrival date and time: 02/02/16 [id number]   First Provider Initiated Contact with Patient 02/02/16 1635      Chief Complaint  Patient presents with  . Diarrhea  . pelvic pressure   HPI RN note: Pt presents to MAU with complaints of diarrhea and pelvic pressure since 2 this morning. Denies any vaginal bleeding or abnormal discharge  Pt is [age] [id number].I.[id number].G4P2012 7375w4d ,[facility], who presents with abd cramping, feeling tightness with sharp pains in her vagina for several weeks.  Pt has had diarrhea since 2 am-about 4 or 5 times today- last incidence at 3 pm- nobody has had diarrhea in family. pt has not vomited and is able to keep fluids down.  Pt took phenergan this morning.  Pt denies chills or fever.  Pt denies pain with urination.  Pt denies spotting or bleeding. Pt denies leakage of fluid or vaginal bleeding.  Past Medical History:  Diagnosis Date  . Abnormal Pap smear of cervix   . Herpes 10/12/2013  . HSV-2 (herpes simplex virus 2) infection   . Hx of gonorrhea   . Pregnant 06/23/2015    Past Surgical History:  Procedure Laterality Date  . CHOLECYSTECTOMY N/A 01/02/2015   Procedure: LAPAROSCOPIC CHOLECYSTECTOMY;  Surgeon: Franky MachoMark Jenkins, MD;  Location: [facility];  Service: General;  Laterality: N/A;  . COLPOSCOPY     colpo with biopsy  . WISDOM TOOTH EXTRACTION      Family History  Problem Relation Age of Onset  . Heart attack Father   . Heart disease Father   . Diabetes Maternal Uncle   . Dementia Maternal Grandmother   . Cancer Maternal Grandfather     throat, lung, bladder  . Diabetes Paternal Grandmother   . Heart attack Paternal Grandfather   . Heart disease Paternal Grandfather     Social History  Substance Use Topics  . Smoking status: Current Some Day Smoker    Packs/day: 0.25    Years: 10.00    Types: Cigarettes  . Smokeless tobacco: Never Used  . Alcohol use No    Allergies: No Known Allergies  Prescriptions Prior to  Admission  Medication Sig Dispense Refill Last Dose  . Prenatal Vit-Fe Fumarate-FA (MULTIVITAMIN-PRENATAL) 27-0.8 MG TABS tablet Take 1 tablet by mouth daily.    Taking  . promethazine (PHENERGAN) 25 MG tablet Take 1 tablet (25 mg total) by mouth every 6 (six) hours as needed for nausea or vomiting. 30 tablet 3 Taking  . valACYclovir (VALTREX) 500 MG tablet Take 1 tablet (500 mg total) by mouth 2 (two) times daily. 60 tablet 6 Taking    Review of Systems  Constitutional: Negative for chills and fever.  Gastrointestinal: Positive for abdominal pain and diarrhea. Negative for nausea and vomiting.  Genitourinary: Negative for dysuria.  Neurological: Negative for headaches.   Physical Exam   Blood pressure 123/74, pulse 93, temperature 98 F (36.7 C), resp. rate 18, last menstrual period 05/07/2015, unknown if currently breastfeeding.  Physical Exam  Nursing note and vitals reviewed. Constitutional: She is oriented to person, place, and time. She appears well-developed and well-nourished. No distress.  HENT:  Head: Normocephalic.  Eyes: Pupils are equal, round, and reactive to light.  Neck: Normal range of motion. Neck supple.  Cardiovascular: Normal rate.   Respiratory: Effort normal.  GI: Soft.  Genitourinary:  Genitourinary Comments: Cervix 1 cm 40%  Musculoskeletal: Normal range of motion.  Neurological: She is alert and oriented to  person, place, and time.  Skin: Skin is warm and dry.  Psychiatric: She has a normal mood and affect.    MAU Course  Procedures Results for orders placed or performed during the hospital encounter of 02/02/16 (from the past 24 hour(s))  Urinalysis, Routine w reflex microscopic (not [facility])     Status: None   Collection Time: 02/02/16  4:15 PM  Result Value Ref Range   Color, Urine YELLOW YELLOW   APPearance CLEAR CLEAR   Specific Gravity, Urine 1.010 1.005 - 1.030   pH 7.0 5.0 - 8.0   Glucose, UA NEGATIVE NEGATIVE mg/dL   Hgb urine dipstick  NEGATIVE NEGATIVE   Bilirubin Urine NEGATIVE NEGATIVE   Ketones, ur NEGATIVE NEGATIVE mg/dL   Protein, ur NEGATIVE NEGATIVE mg/dL   Nitrite NEGATIVE NEGATIVE   Leukocytes, UA NEGATIVE NEGATIVE   NST reactive  With FHR 140 bpm baseline with 6-25 bpm variablility and 15x15 accelerations no ctx noted zofran given in MAU No diarrhea in MAU Assessment and Plan  Abdominal pain in pregnancy- 3rd trimester Braxton Hicks  Kick counts Diarrhea- B>R>A>T diet  Luree Palla 02/02/2016, 4:35 PM

## 2016-02-05 ENCOUNTER — Encounter: Payer: Self-pay | Admitting: Women's Health

## 2016-02-05 ENCOUNTER — Ambulatory Visit (INDEPENDENT_AMBULATORY_CARE_PROVIDER_SITE_OTHER): Payer: 59 | Admitting: Women's Health

## 2016-02-05 VITALS — BP 122/78 | HR 100 | Wt 208.0 lb

## 2016-02-05 DIAGNOSIS — Z3493 Encounter for supervision of normal pregnancy, unspecified, third trimester: Secondary | ICD-10-CM

## 2016-02-05 DIAGNOSIS — Z1389 Encounter for screening for other disorder: Secondary | ICD-10-CM

## 2016-02-05 DIAGNOSIS — Z331 Pregnant state, incidental: Secondary | ICD-10-CM

## 2016-02-05 LAB — POCT URINALYSIS DIPSTICK
Glucose, UA: NEGATIVE
KETONES UA: NEGATIVE
Leukocytes, UA: NEGATIVE
Nitrite, UA: NEGATIVE

## 2016-02-05 NOTE — Progress Notes (Signed)
Low-risk OB appointment Work-in  [id number] 3559w0d Estimated Date of Delivery: 02/19/16 BP 122/78   Pulse 100   Wt 208 lb (94.3 kg)   LMP 05/07/2015   BMI 29.84 kg/m   BP, weight, and urine reviewed.  Refer to obstetrical flow sheet for FH & FHR.  Reports good fm.  Denies regular uc's, lof, or uti s/s. Cramping, pressure, brown d/c yesterday-no itching/irritation. Went to [facility] Fri for r/o labor, was 1cm.  SVE: 1/th/-3, vtx, no blood on glove Reviewed labor s/s, fkc. Manson PasseyBrown d/c yesterday likely from recent SVE Friday Plan:  Continue routine obstetrical care  F/U as scheduled for OB appointment

## 2016-02-05 NOTE — Patient Instructions (Signed)
Call the office ([phone]) or go to [facility] if:  You begin to have strong, frequent contractions  Your water breaks.  Sometimes it is a big gush of fluid, sometimes it is just a trickle that keeps getting your panties wet or running down your legs  You have vaginal bleeding.  It is normal to have a small amount of spotting if your cervix was checked.   You don't feel your baby moving like normal.  If you don't, get you something to eat and drink and lay down and focus on feeling your baby move.  You should feel at least 10 movements in 2 hours.  If you don't, you should call the office or go to [facility].    Braxton Hicks Contractions Contractions of the uterus can occur throughout pregnancy. Contractions are not always a sign that you are in labor.  WHAT ARE BRAXTON HICKS CONTRACTIONS?  Contractions that occur before labor are called Braxton Hicks contractions, or false labor. Toward the end of pregnancy (32-34 weeks), these contractions can develop more often and may become more forceful. This is not true labor because these contractions do not result in opening (dilatation) and thinning of the cervix. They are sometimes difficult to tell apart from true labor because these contractions can be forceful and people have different pain tolerances. You should not feel embarrassed if you go to the hospital with false labor. Sometimes, the only way to tell if you are in true labor is for your health care provider to look for changes in the cervix. If there are no prenatal problems or other health problems associated with the pregnancy, it is completely safe to be sent home with false labor and await the onset of true labor. HOW CAN YOU TELL THE DIFFERENCE BETWEEN TRUE AND FALSE LABOR? False Labor  The contractions of false labor are usually shorter and not as hard as those of true labor.   The contractions are usually irregular.   The contractions are often felt in the front of  the lower abdomen and in the groin.   The contractions may go away when you walk around or change positions while lying down.   The contractions get weaker and are shorter lasting as time goes on.   The contractions do not usually become progressively stronger, regular, and closer together as with true labor.  True Labor  Contractions in true labor last 30-70 seconds, become very regular, usually become more intense, and increase in frequency.   The contractions do not go away with walking.   The discomfort is usually felt in the top of the uterus and spreads to the lower abdomen and low back.   True labor can be determined by your health care provider with an exam. This will show that the cervix is dilating and getting thinner.  WHAT TO REMEMBER  Keep up with your usual exercises and follow other instructions given by your health care provider.   Take medicines as directed by your health care provider.   Keep your regular prenatal appointments.   Eat and drink lightly if you think you are going into labor.   If Braxton Hicks contractions are making you uncomfortable:   Change your position from lying down or resting to walking, or from walking to resting.   Sit and rest in a tub of warm water.   Drink 2-3 glasses of water. Dehydration may cause these contractions.   Do slow and deep breathing several times an hour.    WHEN SHOULD I SEEK IMMEDIATE MEDICAL CARE? Seek immediate medical care if:  Your contractions become stronger, more regular, and closer together.   You have fluid leaking or gushing from your vagina.   You have a fever.   You [place] blood-tinged mucus.   You have vaginal bleeding.   You have continuous abdominal pain.   You have low back pain that you never had before.   You feel your baby's head pushing down and causing pelvic pressure.   Your baby is not moving as much as it used to.    This information is not intended to  replace advice given to you by your health care provider. Make sure you discuss any questions you have with your health care provider.   Document Released: 06/03/2005 Document Revised: 06/08/2013 Document Reviewed: 03/15/2013 Elsevier Interactive Patient Education 2016 [employer] Inc[employer]  

## 2016-02-07 ENCOUNTER — Encounter: Payer: Self-pay | Admitting: Women's Health

## 2016-02-07 ENCOUNTER — Ambulatory Visit (INDEPENDENT_AMBULATORY_CARE_PROVIDER_SITE_OTHER): Payer: 59 | Admitting: Women's Health

## 2016-02-07 VITALS — BP 120/88 | HR 76 | Wt 207.0 lb

## 2016-02-07 DIAGNOSIS — Z3A39 39 weeks gestation of pregnancy: Secondary | ICD-10-CM

## 2016-02-07 DIAGNOSIS — Z3483 Encounter for supervision of other normal pregnancy, third trimester: Secondary | ICD-10-CM

## 2016-02-07 DIAGNOSIS — Z3493 Encounter for supervision of normal pregnancy, unspecified, third trimester: Secondary | ICD-10-CM

## 2016-02-07 DIAGNOSIS — Z331 Pregnant state, incidental: Secondary | ICD-10-CM

## 2016-02-07 DIAGNOSIS — Z1389 Encounter for screening for other disorder: Secondary | ICD-10-CM

## 2016-02-07 LAB — POCT URINALYSIS DIPSTICK
Glucose, UA: NEGATIVE
Glucose, UA: NEGATIVE
KETONES UA: NEGATIVE
Leukocytes, UA: NEGATIVE
Nitrite, UA: NEGATIVE
PROTEIN UA: NEGATIVE
PROTEIN UA: NEGATIVE
RBC UA: NEGATIVE

## 2016-02-07 NOTE — Patient Instructions (Signed)
Call the office ([phone]) or go to [facility] if:  You begin to have strong, frequent contractions  Your water breaks.  Sometimes it is a big gush of fluid, sometimes it is just a trickle that keeps getting your panties wet or running down your legs  You have vaginal bleeding.  It is normal to have a small amount of spotting if your cervix was checked.   You don't feel your baby moving like normal.  If you don't, get you something to eat and drink and lay down and focus on feeling your baby move.  You should feel at least 10 movements in 2 hours.  If you don't, you should call the office or go to [facility].    Braxton Hicks Contractions Contractions of the uterus can occur throughout pregnancy. Contractions are not always a sign that you are in labor.  WHAT ARE BRAXTON HICKS CONTRACTIONS?  Contractions that occur before labor are called Braxton Hicks contractions, or false labor. Toward the end of pregnancy (32-34 weeks), these contractions can develop more often and may become more forceful. This is not true labor because these contractions do not result in opening (dilatation) and thinning of the cervix. They are sometimes difficult to tell apart from true labor because these contractions can be forceful and people have different pain tolerances. You should not feel embarrassed if you go to the hospital with false labor. Sometimes, the only way to tell if you are in true labor is for your health care provider to look for changes in the cervix. If there are no prenatal problems or other health problems associated with the pregnancy, it is completely safe to be sent home with false labor and await the onset of true labor. HOW CAN YOU TELL THE DIFFERENCE BETWEEN TRUE AND FALSE LABOR? False Labor  The contractions of false labor are usually shorter and not as hard as those of true labor.   The contractions are usually irregular.   The contractions are often felt in the front of  the lower abdomen and in the groin.   The contractions may go away when you walk around or change positions while lying down.   The contractions get weaker and are shorter lasting as time goes on.   The contractions do not usually become progressively stronger, regular, and closer together as with true labor.  True Labor  Contractions in true labor last 30-70 seconds, become very regular, usually become more intense, and increase in frequency.   The contractions do not go away with walking.   The discomfort is usually felt in the top of the uterus and spreads to the lower abdomen and low back.   True labor can be determined by your health care provider with an exam. This will show that the cervix is dilating and getting thinner.  WHAT TO REMEMBER  Keep up with your usual exercises and follow other instructions given by your health care provider.   Take medicines as directed by your health care provider.   Keep your regular prenatal appointments.   Eat and drink lightly if you think you are going into labor.   If Braxton Hicks contractions are making you uncomfortable:   Change your position from lying down or resting to walking, or from walking to resting.   Sit and rest in a tub of warm water.   Drink 2-3 glasses of water. Dehydration may cause these contractions.   Do slow and deep breathing several times an hour.    WHEN SHOULD I SEEK IMMEDIATE MEDICAL CARE? Seek immediate medical care if:  Your contractions become stronger, more regular, and closer together.   You have fluid leaking or gushing from your vagina.   You have a fever.   You [place] blood-tinged mucus.   You have vaginal bleeding.   You have continuous abdominal pain.   You have low back pain that you never had before.   You feel your baby's head pushing down and causing pelvic pressure.   Your baby is not moving as much as it used to.    This information is not intended to  replace advice given to you by your health care provider. Make sure you discuss any questions you have with your health care provider.   Document Released: 06/03/2005 Document Revised: 06/08/2013 Document Reviewed: 03/15/2013 Elsevier Interactive Patient Education 2016 Else[employer] Inc.  

## 2016-02-07 NOTE — Progress Notes (Addendum)
Low-risk OB appointment [id number] 3553w2d Estimated Date of Delivery: 02/19/16 BP 120/88   Pulse 76   Wt 207 lb (93.9 kg)   LMP 05/07/2015   BMI 29.70 kg/m   BP, weight, and urine reviewed.  Refer to obstetrical flow sheet for FH & FHR.  Reports good fm.  Denies regular uc's, lof, vb, or uti s/s. No complaints. Declined SVE Reviewed labor s/s, fkc. Plan:  Continue routine obstetrical care  F/U in 1wk for OB appointment

## 2016-02-14 ENCOUNTER — Other Ambulatory Visit (INDEPENDENT_AMBULATORY_CARE_PROVIDER_SITE_OTHER): Payer: 59

## 2016-02-14 ENCOUNTER — Ambulatory Visit (INDEPENDENT_AMBULATORY_CARE_PROVIDER_SITE_OTHER): Payer: 59 | Admitting: Advanced Practice Midwife

## 2016-02-14 VITALS — BP 126/86 | HR 93 | Wt 207.6 lb

## 2016-02-14 DIAGNOSIS — O26843 Uterine size-date discrepancy, third trimester: Secondary | ICD-10-CM

## 2016-02-14 DIAGNOSIS — Z331 Pregnant state, incidental: Secondary | ICD-10-CM

## 2016-02-14 DIAGNOSIS — Z3A40 40 weeks gestation of pregnancy: Secondary | ICD-10-CM

## 2016-02-14 DIAGNOSIS — Z3483 Encounter for supervision of other normal pregnancy, third trimester: Secondary | ICD-10-CM

## 2016-02-14 DIAGNOSIS — Z1389 Encounter for screening for other disorder: Secondary | ICD-10-CM

## 2016-02-14 DIAGNOSIS — Z3493 Encounter for supervision of normal pregnancy, unspecified, third trimester: Secondary | ICD-10-CM

## 2016-02-14 DIAGNOSIS — O09891 Supervision of other high risk pregnancies, first trimester: Secondary | ICD-10-CM

## 2016-02-14 DIAGNOSIS — R030 Elevated blood-pressure reading, without diagnosis of hypertension: Secondary | ICD-10-CM

## 2016-02-14 LAB — POCT URINALYSIS DIPSTICK
Blood, UA: NEGATIVE
GLUCOSE UA: NEGATIVE
Ketones, UA: NEGATIVE
LEUKOCYTES UA: NEGATIVE
Nitrite, UA: NEGATIVE

## 2016-02-14 NOTE — Progress Notes (Signed)
[id number] 5237w2d Estimated Date of Delivery: 02/19/16  Blood pressure 126/86, pulse 93, weight 207 lb 9.6 oz (94.2 kg), last menstrual period 05/07/2015, unknown if currently breastfeeding.   BP weight and urine results all reviewed and noted.   Please refer to the obstetrical flow sheet for the fundal height and fetal heart rate documentation: size < dates today, feel positional, but will get EFW/AFI.    Patient reports good fetal movement, denies any bleeding and no rupture of membranes symptoms or regular contractions. BPs borderline. Woke up w/HA yesterday, but tylenol alleviated it.  Discussed GHTN/PreX s/sx.   All questions were answered.  Orders Placed This Encounter  Procedures  . US OB Follow Up  . POCT urinalysis dipstick    Plan:                    Return in 2 days (on 02/16/2016) for BP check only; 1 week for LROB.  US 39+2 wks,cephalic,post pl gr 1,afi 13.2cm,fhr 146 bpm,EFW 3295 g 42%

## 2016-02-14 NOTE — Progress Notes (Signed)
US 39+2 wks,cephalic,post pl gr 1,afi 13.2cm,fhr 146 bpm,EFW 3295 g 42%

## 2016-02-16 ENCOUNTER — Encounter: Payer: Self-pay | Admitting: *Deleted

## 2016-02-16 ENCOUNTER — Ambulatory Visit (INDEPENDENT_AMBULATORY_CARE_PROVIDER_SITE_OTHER): Payer: 59 | Admitting: *Deleted

## 2016-02-16 VITALS — BP 110/80 | HR 94 | Ht 70.0 in | Wt 207.0 lb

## 2016-02-16 DIAGNOSIS — Z331 Pregnant state, incidental: Secondary | ICD-10-CM

## 2016-02-16 DIAGNOSIS — Z1389 Encounter for screening for other disorder: Secondary | ICD-10-CM

## 2016-02-16 LAB — POCT URINALYSIS DIPSTICK
Glucose, UA: NEGATIVE
Ketones, UA: NEGATIVE
Leukocytes, UA: NEGATIVE
NITRITE UA: NEGATIVE
PROTEIN UA: NEGATIVE

## 2016-02-16 NOTE — Progress Notes (Signed)
Pt here for BP check. BP great at 110/80. Pt just mentions having back pain but that is not a new symptom. Pt has a scheduled appt on Wednesday. Pt was advised to call sooner or call the after hours nurse line if she had any issues before appt. Pt voiced understanding. JSY

## 2016-02-21 ENCOUNTER — Ambulatory Visit (INDEPENDENT_AMBULATORY_CARE_PROVIDER_SITE_OTHER): Payer: 59 | Admitting: Women's Health

## 2016-02-21 ENCOUNTER — Encounter: Payer: Self-pay | Admitting: Women's Health

## 2016-02-21 VITALS — BP 124/78 | HR 96 | Wt 206.0 lb

## 2016-02-21 DIAGNOSIS — Z3483 Encounter for supervision of other normal pregnancy, third trimester: Secondary | ICD-10-CM

## 2016-02-21 DIAGNOSIS — Z3A41 41 weeks gestation of pregnancy: Secondary | ICD-10-CM | POA: Diagnosis not present

## 2016-02-21 DIAGNOSIS — Z331 Pregnant state, incidental: Secondary | ICD-10-CM

## 2016-02-21 DIAGNOSIS — Z3493 Encounter for supervision of normal pregnancy, unspecified, third trimester: Secondary | ICD-10-CM

## 2016-02-21 DIAGNOSIS — Z1389 Encounter for screening for other disorder: Secondary | ICD-10-CM

## 2016-02-21 DIAGNOSIS — O480 Post-term pregnancy: Secondary | ICD-10-CM

## 2016-02-21 LAB — POCT URINALYSIS DIPSTICK
Blood, UA: NEGATIVE
Glucose, UA: NEGATIVE
KETONES UA: NEGATIVE
Leukocytes, UA: NEGATIVE
Nitrite, UA: NEGATIVE

## 2016-02-21 NOTE — Progress Notes (Signed)
Low-risk OB appointment [id number] 6021w2d Estimated Date of Delivery: 02/19/16 BP 124/78   Pulse 96   Wt 206 lb (93.4 kg)   LMP 05/07/2015   BMI 29.56 kg/m   BP, weight, and urine reviewed.  Refer to obstetrical flow sheet for FH & FHR.  Reports good fm.  Denies regular uc's, lof, vb, or uti s/s. No complaints. SVE per request: 1.5/50/-2, vtx, declines membrane sweeping Reviewed labor s/s, fkc bid Plan:  IOL for postdates 9/10 @ 2345 if needed F/U in 4-6wks for pp visit

## 2016-02-21 NOTE — Patient Instructions (Addendum)
Your induction is scheduled for 9/10 @ 11:45pm. [facility] (Emergency) entrance and let them know you are there to be induced. They will send someone from Labor & Delivery to come get you.    Call the office [phone]) or go to [facility] if:  You begin to have strong, frequent contractions  Your water breaks.  Sometimes it is a big gush of fluid, sometimes it is just a trickle that keeps getting your panties wet or running down your legs  You have vaginal bleeding.  It is normal to have a small amount of spotting if your cervix was checked.   You don't feel your baby moving like normal.  If you don't, get you something to eat and drink and lay down and focus on feeling your baby move.  You should feel at least 10 movements in 2 hours.  If you don't, you should call the office or go to [facility].     [facility] Contractions of the uterus can occur throughout pregnancy. Contractions are not always a sign that you are in labor.  WHAT ARE BRAXTON HICKS CONTRACTIONS?  Contractions that occur before labor are called Braxton Hicks contractions, or false labor. Toward the end of pregnancy (32-34 weeks), these contractions can develop more often and may become more forceful. This is not true labor because these contractions do not result in opening (dilatation) and thinning of the cervix. They are sometimes difficult to tell apart from true labor because these contractions can be forceful and people have different pain tolerances. You should not feel embarrassed if you go to the hospital with false labor. Sometimes, the only way to tell if you are in true labor is for your health care provider to look for changes in the cervix. If there are no prenatal problems or other health problems associated with the pregnancy, it is completely safe to be sent home with false labor and await the onset of true labor. HOW CAN YOU TELL THE DIFFERENCE  BETWEEN TRUE AND FALSE LABOR? False Labor  The contractions of false labor are usually shorter and not as hard as those of true labor.   The contractions are usually irregular.   The contractions are often felt in the front of the lower abdomen and in the groin.   The contractions may go away when you walk around or change positions while lying down.   The contractions get weaker and are shorter lasting as time goes on.   The contractions do not usually become progressively stronger, regular, and closer together as with true labor.  True Labor  Contractions in true labor last 30-70 seconds, become very regular, usually become more intense, and increase in frequency.   The contractions do not go away with walking.   The discomfort is usually felt in the top of the uterus and spreads to the lower abdomen and low back.   True labor can be determined by your health care provider with an exam. This will show that the cervix is dilating and getting thinner.  WHAT TO REMEMBER  Keep up with your usual exercises and follow other instructions given by your health care provider.   Take medicines as directed by your health care provider.   Keep your regular prenatal appointments.   Eat and drink lightly if you think you are going into labor.   If Braxton Hicks contractions are making you uncomfortable:   Change your position from lying down  or resting to walking, or from walking to resting.   Sit and rest in a tub of warm water.   Drink 2-3 glasses of water. Dehydration may cause these contractions.   Do slow and deep breathing several times an hour.  WHEN SHOULD I SEEK IMMEDIATE MEDICAL CARE? Seek immediate medical care if:  Your contractions become stronger, more regular, and closer together.   You have fluid leaking or gushing from your vagina.   You have a fever.   You Metayer blood-tinged mucus.   You have vaginal bleeding.   You have continuous  abdominal pain.   You have low back pain that you never had before.   You feel your baby's head pushing down and causing pelvic pressure.   Your baby is not moving as much as it used to.    This information is not intended to replace advice given to you by your health care provider. Make sure you discuss any questions you have with your health care provider.   Document Released: 06/03/2005 Document Revised: 06/08/2013 Document Reviewed: 03/15/2013 Elsevier Interactive Patient Education [employer]

## 2016-02-24 ENCOUNTER — Encounter (HOSPITAL_COMMUNITY): Payer: Self-pay | Admitting: Certified Nurse Midwife

## 2016-02-24 ENCOUNTER — Inpatient Hospital Stay (HOSPITAL_COMMUNITY)
Admission: AD | Admit: 2016-02-24 | Discharge: 2016-02-26 | DRG: 774 | Disposition: A | Discharge status: Home / Self Care | Payer: 59 | Source: Ambulatory Visit | Attending: Obstetrics and Gynecology | Admitting: Obstetrics and Gynecology

## 2016-02-24 DIAGNOSIS — Z3493 Encounter for supervision of normal pregnancy, unspecified, third trimester: Secondary | ICD-10-CM

## 2016-02-24 DIAGNOSIS — IMO0001 Reserved for inherently not codable concepts without codable children: Secondary | ICD-10-CM

## 2016-02-24 DIAGNOSIS — O09891 Supervision of other high risk pregnancies, first trimester: Secondary | ICD-10-CM

## 2016-02-24 DIAGNOSIS — Z8249 Family history of ischemic heart disease and other diseases of the circulatory system: Secondary | ICD-10-CM

## 2016-02-24 DIAGNOSIS — Z833 Family history of diabetes mellitus: Secondary | ICD-10-CM

## 2016-02-24 DIAGNOSIS — A6000 Herpesviral infection of urogenital system, unspecified: Secondary | ICD-10-CM | POA: Diagnosis not present

## 2016-02-24 DIAGNOSIS — Z87891 Personal history of nicotine dependence: Secondary | ICD-10-CM

## 2016-02-24 DIAGNOSIS — O9832 Other infections with a predominantly sexual mode of transmission complicating childbirth: Secondary | ICD-10-CM | POA: Diagnosis not present

## 2016-02-24 DIAGNOSIS — Z3A40 40 weeks gestation of pregnancy: Secondary | ICD-10-CM

## 2016-02-24 DIAGNOSIS — Z3483 Encounter for supervision of other normal pregnancy, third trimester: Secondary | ICD-10-CM | POA: Diagnosis present

## 2016-02-24 LAB — CBC
HEMATOCRIT: 36 % (ref 36.0–46.0)
HEMOGLOBIN: 12.9 g/dL (ref 12.0–15.0)
MCH: 33.7 pg (ref 26.0–34.0)
MCHC: 35.8 g/dL (ref 30.0–36.0)
MCV: 94 fL (ref 78.0–100.0)
Platelets: 196 10*3/uL (ref 150–400)
RBC: 3.83 MIL/uL — ABNORMAL LOW (ref 3.87–5.11)
RDW: 13.3 % (ref 11.5–15.5)
WBC: 7.9 10*3/uL (ref 4.0–10.5)

## 2016-02-24 LAB — TYPE AND SCREEN
ABO/RH(D): A POS
ANTIBODY SCREEN: NEGATIVE

## 2016-02-24 LAB — POCT FERN TEST

## 2016-02-24 MED ORDER — SENNOSIDES-DOCUSATE SODIUM 8.6-50 MG PO TABS
2.0000 | ORAL_TABLET | ORAL | Status: DC
  Administered 2016-02-24 – 2016-02-25 (×2): 2 via ORAL
  Filled 2016-02-24 (×2): qty 2

## 2016-02-24 MED ORDER — COCONUT OIL OIL
1.0000 "application " | TOPICAL_OIL | Status: DC | PRN
  Administered 2016-02-26: 1 via TOPICAL
  Filled 2016-02-24: qty 120

## 2016-02-24 MED ORDER — LACTATED RINGERS IV SOLN: 500.0000 mL | INTRAVENOUS | Status: DC | PRN

## 2016-02-24 MED ORDER — TERBUTALINE SULFATE 1 MG/ML IJ SOLN
0.2500 mg | Freq: Once | INTRAMUSCULAR | Status: DC | PRN
  Filled 2016-02-24: qty 1

## 2016-02-24 MED ORDER — ONDANSETRON HCL 4 MG/2ML IJ SOLN: 4.0000 mg | Freq: Four times a day (QID) | INTRAMUSCULAR | Status: DC | PRN

## 2016-02-24 MED ORDER — DIPHENHYDRAMINE HCL 25 MG PO CAPS: 25.0000 mg | ORAL_CAPSULE | Freq: Four times a day (QID) | ORAL | Status: DC | PRN

## 2016-02-24 MED ORDER — ONDANSETRON HCL 4 MG PO TABS: 4.0000 mg | ORAL_TABLET | ORAL | Status: DC | PRN

## 2016-02-24 MED ORDER — ACETAMINOPHEN 325 MG PO TABS: 650.0000 mg | ORAL_TABLET | ORAL | Status: DC | PRN

## 2016-02-24 MED ORDER — IBUPROFEN 600 MG PO TABS
600.0000 mg | ORAL_TABLET | Freq: Four times a day (QID) | ORAL | Status: DC
  Administered 2016-02-24 – 2016-02-26 (×8): 600 mg via ORAL
  Filled 2016-02-24 (×8): qty 1

## 2016-02-24 MED ORDER — OXYCODONE-ACETAMINOPHEN 5-325 MG PO TABS: 2.0000 | ORAL_TABLET | ORAL | Status: DC | PRN

## 2016-02-24 MED ORDER — PHENYLEPHRINE 40 MCG/ML (10ML) SYRINGE FOR IV PUSH (FOR BLOOD PRESSURE SUPPORT)
80.0000 ug | PREFILLED_SYRINGE | INTRAVENOUS | Status: DC | PRN
  Filled 2016-02-24: qty 5

## 2016-02-24 MED ORDER — ACETAMINOPHEN 325 MG PO TABS
650.0000 mg | ORAL_TABLET | ORAL | Status: DC | PRN
  Administered 2016-02-25 (×2): 650 mg via ORAL
  Filled 2016-02-24 (×2): qty 2

## 2016-02-24 MED ORDER — LIDOCAINE HCL (PF) 1 % IJ SOLN
30.0000 mL | INTRAMUSCULAR | Status: DC | PRN
  Filled 2016-02-24: qty 30

## 2016-02-24 MED ORDER — EPHEDRINE 5 MG/ML INJ
10.0000 mg | INTRAVENOUS | Status: DC | PRN
  Filled 2016-02-24: qty 4

## 2016-02-24 MED ORDER — LACTATED RINGERS IV SOLN: 500.0000 mL | Freq: Once | INTRAVENOUS | Status: DC

## 2016-02-24 MED ORDER — TETANUS-DIPHTH-ACELL PERTUSSIS 5-2.5-18.5 LF-MCG/0.5 IM SUSP: 0.5000 mL | Freq: Once | INTRAMUSCULAR | Status: DC

## 2016-02-24 MED ORDER — WITCH HAZEL-GLYCERIN EX PADS: 1.0000 "application " | MEDICATED_PAD | CUTANEOUS | Status: DC | PRN

## 2016-02-24 MED ORDER — LACTATED RINGERS IV SOLN: INTRAVENOUS | Status: DC

## 2016-02-24 MED ORDER — ZOLPIDEM TARTRATE 5 MG PO TABS: 5.0000 mg | ORAL_TABLET | Freq: Every evening | ORAL | Status: DC | PRN

## 2016-02-24 MED ORDER — OXYTOCIN 40 UNITS IN LACTATED RINGERS INFUSION - SIMPLE MED
1.0000 m[IU]/min | INTRAVENOUS | Status: DC
  Administered 2016-02-24: 2 m[IU]/min via INTRAVENOUS
  Filled 2016-02-24: qty 1000

## 2016-02-24 MED ORDER — DIPHENHYDRAMINE HCL 50 MG/ML IJ SOLN: 12.5000 mg | INTRAMUSCULAR | Status: DC | PRN

## 2016-02-24 MED ORDER — FLEET ENEMA 7-19 GM/118ML RE ENEM: 1.0000 | ENEMA | RECTAL | Status: DC | PRN

## 2016-02-24 MED ORDER — FENTANYL 2.5 MCG/ML BUPIVACAINE 1/10 % EPIDURAL INFUSION (WH - ANES): 14.0000 mL/h | INTRAMUSCULAR | Status: DC | PRN

## 2016-02-24 MED ORDER — SOD CITRATE-CITRIC ACID 500-334 MG/5ML PO SOLN: 30.0000 mL | ORAL | Status: DC | PRN

## 2016-02-24 MED ORDER — ONDANSETRON HCL 4 MG/2ML IJ SOLN: 4.0000 mg | INTRAMUSCULAR | Status: DC | PRN

## 2016-02-24 MED ORDER — OXYCODONE-ACETAMINOPHEN 5-325 MG PO TABS: 1.0000 | ORAL_TABLET | ORAL | Status: DC | PRN

## 2016-02-24 MED ORDER — OXYTOCIN BOLUS FROM INFUSION
500.0000 mL | Freq: Once | INTRAVENOUS | Status: AC
  Administered 2016-02-24: 500 mL via INTRAVENOUS

## 2016-02-24 MED ORDER — FENTANYL CITRATE (PF) 100 MCG/2ML IJ SOLN
100.0000 ug | INTRAMUSCULAR | Status: DC | PRN
  Administered 2016-02-24 (×2): 100 ug via INTRAVENOUS
  Filled 2016-02-24 (×2): qty 2

## 2016-02-24 MED ORDER — BENZOCAINE-MENTHOL 20-0.5 % EX AERO: 1.0000 "application " | INHALATION_SPRAY | CUTANEOUS | Status: DC | PRN

## 2016-02-24 MED ORDER — DIBUCAINE 1 % RE OINT: 1.0000 "application " | TOPICAL_OINTMENT | RECTAL | Status: DC | PRN

## 2016-02-24 MED ORDER — OXYTOCIN 40 UNITS IN LACTATED RINGERS INFUSION - SIMPLE MED: 2.5000 [IU]/h | INTRAVENOUS | Status: DC

## 2016-02-24 MED ORDER — SIMETHICONE 80 MG PO CHEW: 80.0000 mg | CHEWABLE_TABLET | ORAL | Status: DC | PRN

## 2016-02-24 MED ORDER — PRENATAL MULTIVITAMIN CH
1.0000 | ORAL_TABLET | Freq: Every day | ORAL | Status: DC
  Administered 2016-02-25 – 2016-02-26 (×2): 1 via ORAL
  Filled 2016-02-24 (×2): qty 1

## 2016-02-24 MED ORDER — VITAMIN K1 1 MG/0.5ML IJ SOLN
INTRAMUSCULAR | Status: AC
  Filled 2016-02-24: qty 0.5

## 2016-02-24 NOTE — Progress Notes (Signed)
Pt doing well. Increasing pain. Did require 1 dose of IV pain medication thus far. AROM on admission. No significant dilation in cervix after AROM. Pt was started on pitocin and doing well. Category 2 tracing with some early decelerations.

## 2016-02-24 NOTE — Anesthesia Pain Management Evaluation Note (Signed)
  CRNA Pain Management Visit Note  Patient: Stefanie PriestKrystal L [name], 30 y.o., female  "Hello [facility] am a [facility]. We have an anesthesia team available at all times to provide care throughout the hospital, including epidural management and anesthesia for C-section. I don't know your plan for the delivery whether it a natural birth, water birth, IV sedation, nitrous supplementation, doula or epidural, but we want to meet your pain goals."   1.Was your pain managed to your expectations on prior hospitalizations?   Yes - pretty much for the way the baby was positioned.  2.What is your expectation for pain management during this hospitalization? Not sure: thinks the baby is OP like her last delivery and the epidural didn't take away all of the pain.  3.How can we help you reach that goal?  Not sure  Record the patient's initial score and the patient's pain goal.   Pain: 7  Pain Goal: 8 [facility] wants you to be able to say your pain was always managed very well.  Stefanie [name] 02/24/2016

## 2016-02-24 NOTE — MAU Note (Signed)
Pt states ctxs started at 2AM and she has had some bloody show. Pt denies LOF and fetus is active.

## 2016-02-24 NOTE — H&P (Signed)
LABOR AND DELIVERY ADMISSION HISTORY AND PHYSICAL NOTE  Stefanie [name] is a 30 y.o. female [phone] with IUP at [redacted]w[redacted]d by 7wk [place] presenting for SOL.  She reports positive fetal movement. She denies vaginal bleeding.  She does have history of herpes and has been on supression. She denies any prodromal symptoms at this time.    Prenatal History/Complications:  Past Medical History: Past Medical History:  Diagnosis Date  . Abnormal Pap smear of cervix   . Herpes 10/12/2013  . HSV-2 (herpes simplex virus 2) infection   . Hx of gonorrhea   . Pregnant 06/23/2015    Past Surgical History: Past Surgical History:  Procedure Laterality Date  . CHOLECYSTECTOMY N/A 01/02/2015   Procedure: LAPAROSCOPIC CHOLECYSTECTOMY;  Surgeon: Franky Macho, MD;  Location: [facility];  Service: General;  Laterality: N/A;  . COLPOSCOPY     colpo with biopsy  . WISDOM TOOTH EXTRACTION      Obstetrical History: OB History    Gravida Para Term Preterm AB Living   4 2 2   1 2    SAB TAB Ectopic Multiple Live Births   1     0 2      Social History: Social History   Social History  . Marital status: Married    Spouse name: N/A  . Number of children: N/A  . Years of education: N/A   Social History Main Topics  . Smoking status: Former Smoker    Packs/day: 0.25    Years: 10.00    Types: Cigarettes  . Smokeless tobacco: Never Used  . Alcohol use No  . Drug use: No  . Sexual activity: Not Currently    Birth control/ protection: None   Other Topics Concern  . None   Social History Narrative  . None    Family History: Family History  Problem Relation Age of Onset  . Heart attack Father   . Heart disease Father   . Diabetes Maternal Uncle   . Dementia Maternal Grandmother   . Cancer Maternal Grandfather     throat, lung, bladder  . Diabetes Paternal Grandmother   . Heart attack Paternal Grandfather   . Heart disease Paternal Grandfather     Allergies: No Known Allergies  Prescriptions  Prior to Admission  Medication Sig Dispense Refill Last Dose  . acetaminophen (TYLENOL) 325 MG tablet Take 650 mg by mouth every 6 (six) hours as needed for mild pain, moderate pain or headache.    Past Month at Unknown time  . Prenatal Vit-Fe Fumarate-FA (MULTIVITAMIN-PRENATAL) 27-0.8 MG TABS tablet Take 1 tablet by mouth at bedtime.    02/23/2016 at Unknown time  . promethazine (PHENERGAN) 25 MG tablet Take 1 tablet (25 mg total) by mouth every 6 (six) hours as needed for nausea or vomiting. 30 tablet 3 02/24/2016 at Unknown time  . valACYclovir (VALTREX) 500 MG tablet Take 1 tablet (500 mg total) by mouth 2 (two) times daily. 60 tablet 6 02/23/2016 at Unknown time     Review of Systems   All systems reviewed and negative except as stated in HPI  Blood pressure 118/83, pulse 79, temperature 98.2 F (36.8 C), temperature source Oral, resp. rate 18, height 5\' 10"  (1.778 m), weight 206 lb (93.4 kg), last menstrual period 05/07/2015, not currently breastfeeding. General appearance: alert, cooperative and appears stated age Lungs: clear to auscultation bilaterally Heart: regular rate and rhythm Abdomen: soft, non-tender; bowel sounds normal Extremities: No calf swelling or tenderness GU: no lesions on  external labia Presentation: cephalic by my exam Fetal monitoring: category 1 EFW: 8lbs Uterine activity: irregular Dilation: 4 Effacement (%): 80 Station: -2 Exam by:: Dr. Genevie AnnSchenk   Prenatal labs: ABO, Rh:   A+ Antibody: Negative (05/23 0905) Rubella: immune RPR: Non Reactive (05/23 0905)  HBsAg: Negative (01/26 1015)  HIV: Non Reactive (05/23 0905)  GBS: Negative (08/09 1145)   Prenatal Transfer Tool  Maternal Diabetes: No Genetic Screening: Normal Maternal Ultrasounds/Referrals: Normal Fetal Ultrasounds or other Referrals:  None Maternal Substance Abuse:  No Significant Maternal Medications:  None Significant Maternal Lab Results: Lab values include: Group B Strep  negative  Results for orders placed or performed during the hospital encounter of 02/24/16 (from the past 24 hour(s))  Fern Test   Collection Time: 02/24/16 10:33 AM  Result Value Ref Range   POCT Fern Test    CBC   Collection Time: 02/24/16 11:51 AM  Result Value Ref Range   WBC 7.9 4.0 - 10.5 K/uL   RBC 3.83 (L) 3.87 - 5.11 MIL/uL   Hemoglobin 12.9 12.0 - 15.0 g/dL   HCT 40.936.0 81.136.0 - 91.446.0 %   MCV 94.0 78.0 - 100.0 fL   MCH 33.7 26.0 - 34.0 pg   MCHC 35.8 30.0 - 36.0 g/dL   RDW 78.213.3 95.611.5 - 21.315.5 %   Platelets 196 150 - 400 K/uL    Patient Active Problem List   Diagnosis Date Noted  . Active labor at term 02/24/2016  . Cramping affecting pregnancy, antepartum 10/20/2015  . Short interval between pregnancies 07/13/2015  . Supervision of normal pregnancy 06/23/2015  . Nephrolithiasis 12/31/2014  . Candidiasis of vulva and vagina 01/12/2014  . Herpes 10/12/2013    Assessment: Stefanie [name] is a 30 y.o. [id number] at 6772w5d here for SOL  #Labor: AROM supplement if needed #Pain: Nitrous, IV pain meds epidural? #FWB: Category 1 #ID:  GBS neg #MOF: breast #MOC: tubal ligation versus vasectomy #Circ:  Yes inpatient.  Stefanie [name] 02/24/2016, 2:36 PM

## 2016-02-25 LAB — RPR: RPR Ser Ql: NONREACTIVE

## 2016-02-25 NOTE — Lactation Note (Signed)
This note was copied from a baby's chart. Lactation Consultation Note  P4, Mother breastfed last child for approx 3 months. Mother states she is having difficulty latching baby on R breast. L nipple is more evert than R side.  Nipples are compressible. Attempted latching on R breast.  Baby became frustrated so mother latched him on L breast. Provided mother w/ manual pump and suggest she prepump before latching and hand express. Mom encouraged to feed baby 8-12 times/24 hours and with feeding cues.  Mom made aware of O/P services, breastfeeding support groups, community resources, and our phone # for post-discharge questions.     Patient Name: Boy Arvella NighKrystal Nordhoff UJWJX'BToday's Date: 02/25/2016 Reason for consult: Initial assessment   Maternal Data    Feeding Feeding Type: Breast Fed  LATCH Score/Interventions Latch: Grasps breast easily, tongue down, lips flanged, rhythmical sucking. Intervention(s): Breast massage  Audible Swallowing: A few with stimulation  Type of Nipple: Everted at rest and after stimulation  Comfort (Breast/Nipple): Soft / non-tender     Hold (Positioning): Assistance needed to correctly position infant at breast and maintain latch.  LATCH Score: 8  Lactation Tools Discussed/Used     Consult Status Consult Status: Follow-up Date: 02/26/16 Follow-up type: In-patient    Dahlia ByesBerkelhammer, Areon Cocuzza [facility] 02/25/2016, 11:56 AM

## 2016-02-25 NOTE — Progress Notes (Signed)
Post Partum Day 1 Subjective: no complaints, up ad lib, voiding and tolerating PO  Objective: Blood pressure 99/62, pulse 75, temperature 98.2 F (36.8 C), temperature source Oral, resp. rate 18, height 5\' 10"  (1.778 m), weight 206 lb (93.4 kg), last menstrual period 05/07/2015, unknown if currently breastfeeding.  Physical Exam:  General: alert, cooperative, appears stated age and no distress Lochia: appropriate Uterine Fundus: firm Incision: n/a DVT Evaluation: No evidence of DVT seen on physical exam. Negative Homan's sign.   Recent Labs  02/24/16 1151  HGB 12.9  HCT 36.0    Assessment/Plan: Plan for discharge tomorrow   LOS: 1 day   Stefanie [name] 02/25/2016, 9:43 AM

## 2016-02-26 ENCOUNTER — Inpatient Hospital Stay (HOSPITAL_COMMUNITY): Payer: Managed Care, Other (non HMO)

## 2016-02-26 MED ORDER — IBUPROFEN 600 MG PO TABS: 600.0000 mg | ORAL_TABLET | Freq: Four times a day (QID) | ORAL | 0 refills | Status: DC

## 2016-02-26 NOTE — Progress Notes (Signed)
[name] had brought in her own lanolin.  Gave her coconut oil for sore nipples and encouraged her to use it instead of lanolin.  Educated mom on risk of yeast associated with lanolin use.  Also educated on use of colostrum to help heal nipples.  Educated on depth of latch to help with sore nipples.  Also educated on risk of her giving a pacifier before breastfeeding is established well

## 2016-02-26 NOTE — Discharge Summary (Signed)
OB Discharge Summary  Patient Name: Stefanie [name] DOB: 1985-06-24 MRN: [id number]  Date of admission: 02/24/2016 Delivering MD: Ernestina PennaSCHENK, NICHOLAS MICHAEL   Date of discharge: 02/26/2016  Admitting diagnosis: 40 weeks contractions and bleeding Intrauterine pregnancy: 9665w5d     Secondary diagnosis:Active Problems:   Active labor at term  Additional problems:none     Discharge diagnosis: Term Pregnancy Delivered                                                                     Post partum procedures:n/a  Augmentation: n/a  Complications: None  Hospital course:  Onset of Labor With Vaginal Delivery     30 y.o. yo [id number] at 265w5d was admitted in Active Labor on 02/24/2016. Patient had an uncomplicated labor course as follows:  Membrane Rupture Time/Date: 2:20 PM ,02/24/2016   Intrapartum Procedures: Episiotomy: None [1]                                         Lacerations:  Labial [10]  Patient had a delivery of a Viable infant. 02/24/2016  Information for the patient's newborn:  Stefanie [name], Stefanie [name] [[id number]][[id number]]  Delivery Method: Vaginal, Spontaneous Delivery (Filed from Delivery Summary)    Pateint had an uncomplicated postpartum course.  She is ambulating, tolerating a regular diet, passing flatus, and urinating well. Patient is discharged home in stable condition on 02/26/16.    Physical exam Vitals:   02/25/16 0419 02/25/16 1045 02/25/16 1730 02/26/16 0530  BP: 99/62 104/80 106/67 111/73  Pulse: 75 85 85 77  Resp: 18 18 18 18   Temp: 98.2 F (36.8 C) 98.2 F (36.8 C) 98 F (36.7 C) 98.2 F (36.8 C)  TempSrc: Oral Oral Oral Oral  SpO2:  97%    Weight:      Height:       General: alert, cooperative and no distress Lochia: appropriate Uterine Fundus: firm Incision: N/A DVT Evaluation: No evidence of DVT seen on physical exam. Negative Homan's sign. Labs: Lab Results  Component Value Date   WBC 7.9 02/24/2016   HGB 12.9 02/24/2016   HCT 36.0 02/24/2016   MCV  94.0 02/24/2016   PLT 196 02/24/2016   CMP Latest Ref Rng & Units 01/01/2015  Glucose 65 - 99 mg/dL 99  BUN 6 - 20 mg/dL 7  Creatinine 1.300.44 - 8.651.00 mg/dL 7.840.65  Sodium 696135 - 295145 mmol/L 140  Potassium 3.5 - 5.1 mmol/L 3.8  Chloride 101 - 111 mmol/L 110  CO2 22 - 32 mmol/L 27  Calcium 8.9 - 10.3 mg/dL 8.0(L)  Total Protein 6.5 - 8.1 g/dL -  Total Bilirubin 0.3 - 1.2 mg/dL -  Alkaline Phos 38 - 284126 U/L -  AST 15 - 41 U/L -  ALT 14 - 54 U/L -    Discharge instruction: per After Visit Summary and "Baby and Me Booklet".  After Visit Meds:    Medication List    STOP taking these medications   acetaminophen 325 MG tablet Commonly known as:  TYLENOL   promethazine 25 MG tablet Commonly known as:  PHENERGAN     TAKE these  medications   ibuprofen 600 MG tablet Commonly known as:  ADVIL,MOTRIN Take 1 tablet (600 mg total) by mouth every 6 (six) hours.   multivitamin-prenatal 27-0.8 MG Tabs tablet Take 1 tablet by mouth at bedtime.   valACYclovir 500 MG tablet Commonly known as:  VALTREX Take 1 tablet (500 mg total) by mouth 2 (two) times daily.       Diet: routine diet  Activity: Advance as tolerated. Pelvic rest for 6 weeks.   Outpatient follow up:6 weeks Follow up Appt:No future appointments. Follow up visit: No Follow-up on file.  Postpartum contraception: Vasectomy  Newborn Data: Live born female  Birth Weight: 6 lb 14.6 oz (3135 g) APGAR: 9, 9  Baby Feeding: Breast Disposition:home with mother   02/26/2016 Stefanie [name], CNM

## 2016-02-26 NOTE — Lactation Note (Signed)
This note was copied from a baby's chart. Lactation Consultation Note  Patient Name: Stefanie [name] Reason for consult: Follow-up assessment;Infant weight loss  Baby is 4438 hours old , and for age  Voids and stools QS. Breast feeding consistently  Both breast. Per mom nipples a little sensitive . Using her EBM and coconut oil.  LC reviewed basics - LC recommended and encouraged mom prior to latch - breast massage,  Hand express, pre- pump if needed and that with breast compressions until swallows and then intermittent .  Sore nipple and engorgement prevention and tx reviewed. Per mom  Has  DEBP - [place] ( insurance )  LC discussed nutritive vs non - nutritive feedings . Mom aware. Also when milk came always to soften 1st breast well  Prior to offering the 2nd breast. And  If the baby only fed 1st breast to release 2nd breast down to comfort.  Mom had many questions regarding possibility of going back to work at 6 weeks breast feeding and pumping or changing to  Formula feeding. At the point mom undecided. LC explored options with mom. LC also recommended to call back at 2 1 /2 weeks to explore options  Again. And for now enjoy the baby and breastfeeding. Mother informed of post-discharge support and given phone number to the lactation department, including services for phone call assistance; out-patient appointments; and breastfeeding support group. List of other breastfeeding resources in the community given in the handout. Encouraged mother to call for problems or concerns related to breastfeeding.   Maternal Data Does the patient have breastfeeding experience prior to this delivery?: Yes  Feeding Feeding Type: Breast Fed Length of feed: 18 min (per mom )  LATCH Score/Interventions Latch: Grasps breast easily, tongue down, lips flanged, rhythmical sucking. Intervention(s): Skin to skin  Audible Swallowing: Spontaneous and intermittent Intervention(s): Hand  expression  Type of Nipple: Everted at rest and after stimulation  Comfort (Breast/Nipple): Filling, red/small blisters or bruises, mild/mod discomfort  Problem noted: Filling (mild discomfort)  Hold (Positioning): No assistance needed to correctly position infant at breast. Intervention(s): Breastfeeding basics reviewed  LATCH Score: 9  Lactation Tools Discussed/Used     Consult Status Consult Status: Complete Date: 02/26/16    Kathrin Greathouseorio, [name] [name], 10:52 AM

## 2016-02-29 ENCOUNTER — Telehealth: Payer: Self-pay | Admitting: *Deleted

## 2016-02-29 NOTE — Telephone Encounter (Signed)
Pt informed completed disability forms based on date of delivery and SVD (six weeks recovery time). Pt verbalized understanding.

## 2016-03-01 NOTE — Telephone Encounter (Signed)
error 

## 2016-03-11 ENCOUNTER — Telehealth: Payer: Self-pay | Admitting: *Deleted

## 2016-03-11 NOTE — Telephone Encounter (Signed)
Spoke with pt. Pt thinks she has a cold or allergies. Pt is breastfeeding. I advised to try Claritin or Zyrtec. Also can try Tylenol and cough drops. Run a cool mist humidifier in bedroom when sleeping. Advised to call back if she got worse or no better. Pt voiced understanding. JSY

## 2016-04-01 ENCOUNTER — Ambulatory Visit (INDEPENDENT_AMBULATORY_CARE_PROVIDER_SITE_OTHER): Payer: 59 | Admitting: Women's Health

## 2016-04-01 ENCOUNTER — Encounter: Payer: Self-pay | Admitting: Women's Health

## 2016-04-01 DIAGNOSIS — Z3202 Encounter for pregnancy test, result negative: Secondary | ICD-10-CM | POA: Diagnosis not present

## 2016-04-01 LAB — POCT URINE PREGNANCY: PREG TEST UR: NEGATIVE

## 2016-04-01 MED ORDER — NORETHINDRONE 0.35 MG PO TABS: ORAL_TABLET | ORAL | 11 refills | Status: DC

## 2016-04-01 NOTE — Patient Instructions (Signed)
Tips To Increase Milk Supply  Lots of water! Enough so that your urine is clear  Plenty of calories, if you're not getting enough calories, your milk supply can decrease  Breastfeed/pump often, every 2-3 hours x 20-30mins  Fenugreek 3 pills 3 times a day, this may make your urine smell like maple syrup  Mother's Milk Tea  Lactation cookies, google for the recipe  Real oatmeal   Norethindrone tablets (contraception) What is this medicine? NORETHINDRONE (nor eth IN drone) is an oral contraceptive. The product contains a female hormone known as a progestin. It is used to prevent pregnancy. This medicine may be used for other purposes; ask your health care provider or pharmacist if you have questions. What should I tell my health care provider before I take this medicine? They need to know if you have any of these conditions: -blood vessel disease or blood clots -breast, cervical, or vaginal cancer -diabetes -heart disease -kidney disease -liver disease -mental depression -migraine -seizures -stroke -vaginal bleeding -an unusual or allergic reaction to norethindrone, other medicines, foods, dyes, or preservatives -pregnant or trying to get pregnant -breast-feeding How should I use this medicine? Take this medicine by mouth with a glass of water. You may take it with or without food. Follow the directions on the prescription label. Take this medicine at the same time each day and in the order directed on the package. Do not take your medicine more often than directed. Contact your pediatrician regarding the use of this medicine in children. Special care may be needed. This medicine has been used in female children who have started having menstrual periods. A patient package insert for the product will be given with each prescription and refill. Read this sheet carefully each time. The sheet may change frequently. Overdosage: If you think you have taken too much of this medicine  contact a poison control center or emergency room at once. NOTE: This medicine is only for you. Do not share this medicine with others. What if I miss a dose? Try not to miss a dose. Every time you miss a dose or take a dose late your chance of pregnancy increases. When 1 pill is missed (even if only 3 hours late), take the missed pill as soon as possible and continue taking a pill each day at the regular time (use a back up method of birth control for the next 48 hours). If more than 1 dose is missed, use an additional birth control method for the rest of your pill pack until menses occurs. Contact your health care professional if more than 1 dose has been missed. What may interact with this medicine? Do not take this medicine with any of the following medications: -amprenavir or fosamprenavir -bosentan This medicine may also interact with the following medications: -antibiotics or medicines for infections, especially rifampin, rifabutin, rifapentine, and griseofulvin, and possibly penicillins or tetracyclines -aprepitant -barbiturate medicines, such as phenobarbital -carbamazepine -felbamate -modafinil -oxcarbazepine -phenytoin -ritonavir or other medicines for HIV infection or AIDS -St. John's wort -topiramate This list may not describe all possible interactions. Give your health care provider a list of all the medicines, herbs, non-prescription drugs, or dietary supplements you use. Also tell them if you smoke, drink alcohol, or use illegal drugs. Some items may interact with your medicine. What should I watch for while using this medicine? Visit your doctor or health care professional for regular checks on your progress. You will need a regular breast and pelvic exam and Pap smear while   on this medicine. Use an additional method of birth control during the first cycle that you take these tablets. If you have any reason to think you are pregnant, stop taking this medicine right away and  contact your doctor or health care professional. If you are taking this medicine for hormone related problems, it may take several cycles of use to see improvement in your condition. This medicine does not protect you against HIV infection (AIDS) or any other sexually transmitted diseases. What side effects may I notice from receiving this medicine? Side effects that you should report to your doctor or health care professional as soon as possible: -breast tenderness or discharge -pain in the abdomen, chest, groin or leg -severe headache -skin rash, itching, or hives -sudden shortness of breath -unusually weak or tired -vision or speech problems -yellowing of skin or eyes Side effects that usually do not require medical attention (report to your doctor or health care professional if they continue or are bothersome): -changes in sexual desire -change in menstrual flow -facial hair growth -fluid retention and swelling -headache -irritability -nausea -weight gain or loss This list may not describe all possible side effects. Call your doctor for medical advice about side effects. You may report side effects to FDA at 1-800-FDA-1088. Where should I keep my medicine? Keep out of the reach of children. Store at room temperature between 15 and 30 degrees C (59 and 86 degrees F). Throw away any unused medicine after the expiration date. NOTE: This sheet is a summary. It may not cover all possible information. If you have questions about this medicine, talk to your doctor, pharmacist, or health care provider.    2016, Elsevier/Gold Standard. (2012-02-21 16:41:35)  

## 2016-04-01 NOTE — Progress Notes (Signed)
Subjective:    Stefanie PriestKrystal L [name] is a 30 y.o. [phone] Caucasian female who presents for a postpartum visit. She is 5 weeks postpartum following a spontaneous vaginal delivery at 40.5 gestational weeks. Anesthesia: IV pain meds. I have fully reviewed the prenatal and intrapartum course. Postpartum course has been uncomplicated. Baby's course has been uncomplicated. Baby is feeding by breast. Bleeding thin lochia. Bowel function is normal. Bladder function is normal. Patient is not sexually active. Last sexual activity: prior to birth of baby. Contraception method is husband plans vasectomy, pt wants POPs until he is cleared. Postpartum depression screening: negative. Score 7.  Last pap 1/17 and was neg w/ neg HRHPV.  The following portions of the patient's history were reviewed and updated as appropriate: allergies, current medications, past medical history, past surgical history and problem list.  Review of Systems Pertinent items are noted in HPI.   Vitals:   04/01/16 1023  BP: 108/84  Pulse: 72  Weight: 188 lb 8 oz (85.5 kg)   Patient's last menstrual period was 05/07/2015.  Objective:   General:  alert, cooperative and no distress   Breasts:  deferred, no complaints  Lungs: clear to auscultation bilaterally  Heart:  regular rate and rhythm  Abdomen: soft, nontender   Vulva: normal  Vagina: normal vagina  Cervix:  closed  Corpus: Well-involuted  Adnexa:  Non-palpable  Rectal Exam: No hemorrhoids        Assessment:   Postpartum exam 5 wks s/p SVB Breastfeeding Depression screening Contraception counseling   Plan:   Contraception: rx micronor, understands has to take at exact same time daily to be effective Follow up in: 3 months for pap & physical or earlier if needed  Marge DuncansBooker, Alesia Oshields Randall CNM, [facility]NP-BC 04/01/2016 10:55 AM

## 2016-04-08 ENCOUNTER — Ambulatory Visit: Payer: 59 | Admitting: Women's Health

## 2016-07-02 ENCOUNTER — Other Ambulatory Visit: Payer: 59 | Admitting: Advanced Practice Midwife

## 2016-07-24 ENCOUNTER — Ambulatory Visit (INDEPENDENT_AMBULATORY_CARE_PROVIDER_SITE_OTHER): Payer: 59 | Admitting: Advanced Practice Midwife

## 2016-07-24 ENCOUNTER — Encounter: Payer: Self-pay | Admitting: Advanced Practice Midwife

## 2016-07-24 VITALS — BP 100/60 | HR 72 | Ht 69.0 in | Wt 179.4 lb

## 2016-07-24 DIAGNOSIS — Z01419 Encounter for gynecological examination (general) (routine) without abnormal findings: Secondary | ICD-10-CM

## 2016-07-24 DIAGNOSIS — N898 Other specified noninflammatory disorders of vagina: Secondary | ICD-10-CM

## 2016-07-24 MED ORDER — METOCLOPRAMIDE HCL 10 MG PO TABS: 10.0000 mg | ORAL_TABLET | Freq: Three times a day (TID) | ORAL | 3 refills | Status: DC

## 2016-07-24 NOTE — Progress Notes (Signed)
Stefanie [name] 31 y.o.  Vitals:   07/24/16 0839  BP: 100/60  Pulse: 72     Filed Weights   07/24/16 0839  Weight: 179 lb 6.4 oz (81.4 kg)    Past Medical History: Past Medical History:  Diagnosis Date  . Abnormal Pap smear of cervix   . Herpes 10/12/2013  . HSV-2 (herpes simplex virus 2) infection   . Hx of gonorrhea   . Pregnant 06/23/2015    Past Surgical History: Past Surgical History:  Procedure Laterality Date  . CHOLECYSTECTOMY N/A 01/02/2015   Procedure: LAPAROSCOPIC CHOLECYSTECTOMY;  Surgeon: Franky MachoMark Jenkins, MD;  Location: [facility];  Service: General;  Laterality: N/A;  . COLPOSCOPY     colpo with biopsy  . WISDOM TOOTH EXTRACTION      Family History: Family History  Problem Relation Age of Onset  . Heart attack Father   . Heart disease Father   . Diabetes Maternal Uncle   . Dementia Maternal Grandmother   . Cancer Maternal Grandfather     throat, lung, bladder  . Diabetes Paternal Grandmother   . Heart attack Paternal Grandfather   . Heart disease Paternal Grandfather     Social History: Social History  Substance Use Topics  . Smoking status: Former Smoker    Packs/day: 0.25    Years: 10.00    Types: Cigarettes  . Smokeless tobacco: Never Used  . Alcohol use No    Allergies: No Known Allergies    Current Outpatient Prescriptions:  .  norethindrone (MICRONOR,CAMILA,ERRIN) 0.35 MG tablet, 1 tablet by mouth at same time daily, Disp: 1 Package, Rfl: 11 .  Prenatal Vit-Fe Fumarate-FA (MULTIVITAMIN-PRENATAL) 27-0.8 MG TABS tablet, Take 1 tablet by mouth at bedtime. , Disp: , Rfl:   History of Present Illness: here for physical.  Last pap 2017, normal.  Has not had an abnormal one.  Pecola LeisureBaby is about 4months old.  Hair falling out. Wants to increase breast milk.  Takes Fenugreek,  Notices a "yeasty" smell.  Still hasn't had sex since delivery, husband hasn't gotten vasectomy.  "We don't have time".     Review of Systems   Patient denies any headaches,  blurred vision, shortness of breath, chest pain, abdominal pain, problems with bowel movements, urination, or intercourse.   Physical Exam: General:  Well developed, well nourished, no acute distress Skin:  Warm and dry Neck:  Midline trachea, normal thyroid Lungs; Clear to auscultation bilaterally Breast:  No dominant palpable mass, retraction, or nipple discharge Cardiovascular: Regular rate and rhythm Abdomen:  Soft, non tender, no hepatosplenomegaly Pelvic:  External genitalia is normal in appearance.  The vagina is normal in appearance.  The cervix is bulbous.  Uterus is felt to be normal size, shape, and contour.  No adnexal masses or tenderness noted. Wet prep negative.  No odor Extremities:  No swelling or varicosities noted Psych:  No mood changes.     Impression: Normal Physical No evidence of vaginal infection/odor--probably Fenugreek     Plan: Pap January 2020 rx reglan Try to make time for intimacy

## 2016-09-19 ENCOUNTER — Encounter: Payer: Self-pay | Admitting: Obstetrics & Gynecology

## 2016-09-19 ENCOUNTER — Ambulatory Visit (INDEPENDENT_AMBULATORY_CARE_PROVIDER_SITE_OTHER): Payer: 59 | Admitting: Obstetrics & Gynecology

## 2016-09-19 VITALS — BP 114/70 | HR 89 | Wt 194.0 lb

## 2016-09-19 DIAGNOSIS — N762 Acute vulvitis: Secondary | ICD-10-CM

## 2016-09-19 MED ORDER — FLUOCINONIDE EMULSIFIED BASE 0.05 % EX CREA: 1.0000 "application " | TOPICAL_CREAM | Freq: Two times a day (BID) | CUTANEOUS | 0 refills | Status: DC

## 2016-09-19 NOTE — Progress Notes (Signed)
Chief Complaint  Patient presents with  . vaginal irritation    Blood pressure 114/70, pulse 89, weight 194 lb (88 kg), last menstrual period 07/07/2016, currently breastfeeding.  31 y.o. [id number] Patient's last menstrual period was 07/07/2016 (approximate). The current method of family planning is oral progesterone-only contraceptive.  Outpatient Encounter Prescriptions as of 09/19/2016  Medication Sig  . norethindrone (MICRONOR,CAMILA,ERRIN) 0.35 MG tablet 1 tablet by mouth at same time daily  . Prenatal Vit-Fe Fumarate-FA (MULTIVITAMIN-PRENATAL) 27-0.8 MG TABS tablet Take 1 tablet by mouth at bedtime.   . valACYclovir (VALTREX) 500 MG tablet Take 500 mg by mouth 2 (two) times daily.  . Fluocinonide Emulsified Base 0.05 % CREA Apply 1 application topically 2 (two) times daily.  . metoCLOPramide (REGLAN) 10 MG tablet Take 1 tablet (10 mg total) by mouth 3 (three) times daily. (Patient not taking: Reported on 09/19/2016)   No facility-administered encounter medications on file as of 09/19/2016.     Subjective Pt with vuvlar irritation for about 1 week No discharge No odor  No ulcerative lesions that she has seen but did start the valtrex prescription just I case  Objective Erythema of both vulva, looks more hypersensitivity than yeast No ulcers consistent with HSV No yeast seen  Pertinent ROS No burning with urination, frequency or urgency No nausea, vomiting or diarrhea Nor fever chills or other constitutional symptoms   Labs or studies     Impression Diagnoses this Encounter::   ICD-9-CM ICD-10-CM   1. Allergic vulvitis 616.10 N76.2     Established relevant diagnosis(es):   Plan/Recommendations: Meds ordered this encounter  Medications  . valACYclovir (VALTREX) 500 MG tablet    Sig: Take 500 mg by mouth 2 (two) times daily.  . Fluocinonide Emulsified Base 0.05 % CREA    Sig: Apply 1 application topically 2 (two) times daily.    Dispense:  15 g   Refill:  0    Labs or Scans Ordered: No orders of the defined types were placed in this encounter.   Management:: Use lidex e .05% BID  Follow up Return if symptoms worsen or fail to improve.          All questions were answered.  Past Medical History:  Diagnosis Date  . Abnormal Pap smear of cervix   . Herpes 10/12/2013  . HSV-2 (herpes simplex virus 2) infection   . Hx of gonorrhea   . Pregnant 06/23/2015    Past Surgical History:  Procedure Laterality Date  . CHOLECYSTECTOMY N/A 01/02/2015   Procedure: LAPAROSCOPIC CHOLECYSTECTOMY;  Surgeon: Franky Macho, MD;  Location: [facility];  Service: General;  Laterality: N/A;  . COLPOSCOPY     colpo with biopsy  . WISDOM TOOTH EXTRACTION      OB History    Gravida Para Term Preterm AB Living   SAB TAB Ectopic Multiple Live Births   1     0 3      No Known Allergies  Social History   Social History  . Marital status: Married    Spouse name: N/A  . Number of children: N/A  . Years of education: N/A   Social History Main Topics  . Smoking status: Former Smoker    Packs/day: 0.25    Years: 10.00    Types: Cigarettes  . Smokeless tobacco: Never Used  . Alcohol use No  . Drug use: No  . Sexual activity: Yes  Birth control/ protection: None, Pill   Other Topics Concern  . None   Social History Narrative  . None    Family History  Problem Relation Age of Onset  . Heart attack Father   . Heart disease Father   . Diabetes Maternal Uncle   . Dementia Maternal Grandmother   . Cancer Maternal Grandfather     throat, lung, bladder  . Diabetes Paternal Grandmother   . Heart attack Paternal Grandfather   . Heart disease Paternal Grandfather

## 2016-10-31 ENCOUNTER — Telehealth: Payer: Self-pay | Admitting: Obstetrics & Gynecology

## 2016-10-31 ENCOUNTER — Ambulatory Visit: Payer: Self-pay | Admitting: Obstetrics & Gynecology

## 2016-10-31 NOTE — Telephone Encounter (Signed)
Per our conversation.

## 2016-10-31 NOTE — Telephone Encounter (Signed)
I would need to see the patient and do a wet prep and exam at least 3 days after last use of cream

## 2016-10-31 NOTE — Telephone Encounter (Signed)
Informed patient that per conversation with Dr Despina HiddenEure, she needed to be seen 3 days after cream use. Advised to call if she gets a day off between now and July to get appointment. Informed to stop BCP to see if there is a correlation but to use back up protection while not taking meds. Verbalized understanding.

## 2016-10-31 NOTE — Telephone Encounter (Signed)
Patient called stating she is still using the cream prescribed BID but is still having vaginal itching and irritation. She is concerned it could be from the birth control pills, she had the same symptoms from before when she was on it. However, she doesn't know if it's a yeast infection or not. She has been under stress since her Grandmother passed away Sunday but states this is going on 3 weeks now. Please advise.

## 2016-10-31 NOTE — Telephone Encounter (Signed)
Spoke to patient again and she states she was on depo for 4 years with no problems. Can her BC be switched to this?

## 2016-11-06 ENCOUNTER — Ambulatory Visit (INDEPENDENT_AMBULATORY_CARE_PROVIDER_SITE_OTHER): Payer: 59 | Admitting: Obstetrics & Gynecology

## 2016-11-06 ENCOUNTER — Encounter: Payer: Self-pay | Admitting: Obstetrics & Gynecology

## 2016-11-06 VITALS — BP 100/70 | HR 72 | Wt 189.4 lb

## 2016-11-06 DIAGNOSIS — N760 Acute vaginitis: Secondary | ICD-10-CM | POA: Diagnosis not present

## 2016-11-06 DIAGNOSIS — N72 Inflammatory disease of cervix uteri: Secondary | ICD-10-CM | POA: Diagnosis not present

## 2016-11-06 MED ORDER — DOXYCYCLINE HYCLATE 100 MG PO TABS: 100.0000 mg | ORAL_TABLET | Freq: Two times a day (BID) | ORAL | 0 refills | Status: DC

## 2016-11-06 NOTE — Progress Notes (Signed)
Chief Complaint  Patient presents with  . switch birth control    had vaginal infection/treated with over counter medicine    Blood pressure 100/70, pulse 72, weight 189 lb 6.4 oz (85.9 kg), last menstrual period 09/26/2016, currently breastfeeding.  31 y.o. [id number] Patient's last menstrual period was 09/26/2016. The current method of family planning is none.  Outpatient Encounter Prescriptions as of 11/06/2016  Medication Sig  . norethindrone (MICRONOR,CAMILA,ERRIN) 0.35 MG tablet 1 tablet by mouth at same time daily  . Prenatal Vit-Fe Fumarate-FA (MULTIVITAMIN-PRENATAL) 27-0.8 MG TABS tablet Take 1 tablet by mouth at bedtime.   . valACYclovir (VALTREX) 500 MG tablet Take 500 mg by mouth 2 (two) times daily.  Marland Kitchen doxycycline (VIBRA-TABS) 100 MG tablet Take 1 tablet (100 mg total) by mouth 2 (two) times daily.  . Fluocinonide Emulsified Base 0.05 % CREA Apply 1 application topically 2 (two) times daily. (Patient not taking: Reported on 11/06/2016)  . metoCLOPramide (REGLAN) 10 MG tablet Take 1 tablet (10 mg total) by mouth 3 (three) times daily. (Patient not taking: Reported on 09/19/2016)   No facility-administered encounter medications on file as of 11/06/2016.     Subjective Pt was seen on 09/19/2016 and found to have what I felt was an allergic vulvovaginitis and she responded to topically lidex, her wet prep was normal Her symptoms completely resolved However about 2 weeks ago began to have intense burning again No real discharge, almost felt dry No odor  Objective General WDWN female NAD Vulva:  normal appearing vulva with no masses, tenderness or lesions Vagina:  normal mucosa with small amount of discharge Cervix:  no cervical motion tenderness and no lesions Uterus:  normal size, contour, position, consistency, mobility, non-tender Adnexa: ovaries:,     Wet Prep:   A sample of vaginal discharge was obtained from the posterior fornix using a cotton swab. 2 drops of  saline were placed on a slide and the cotton swab was immersed in the saline. Microscopic evaluation was performed and results were as follows:  Negative  for yeast  Negative for clue cells , consistent with Bacterial vaginosis Negative for trichomonas  Abnormal- excessive WBC population   Whiff test: Negative  Pertinent ROS No burning with urination, frequency or urgency No nausea, vomiting or diarrhea Nor fever chills or other constitutional symptoms   Labs or studies     Impression Diagnoses this Encounter::   ICD-9-CM ICD-10-CM   1. Cervicitis 616.0 N72   2. Acute vaginitis 616.10 N76.0     Established relevant diagnosis(es):   Plan/Recommendations: Meds ordered this encounter  Medications  . doxycycline (VIBRA-TABS) 100 MG tablet    Sig: Take 1 tablet (100 mg total) by mouth 2 (two) times daily.    Dispense:  20 tablet    Refill:  0    Labs or Scans Ordered: No orders of the defined types were placed in this encounter.   Management:: Will also encourage Femdophilus Follow up wet prep in 2 weeks after therapy completed  Follow up Return in about 2 weeks (around 11/20/2016) for Follow up, with Dr Despina Hidden.     All questions were answered.  Past Medical History:  Diagnosis Date  . Abnormal Pap smear of cervix   . Herpes 10/12/2013  . HSV-2 (herpes simplex virus 2) infection   . Hx of gonorrhea   . Pregnant 06/23/2015    Past Surgical History:  Procedure Laterality Date  . CHOLECYSTECTOMY N/A 01/02/2015   Procedure: LAPAROSCOPIC  CHOLECYSTECTOMY;  Surgeon: Franky MachoMark Jenkins, MD;  Location: [facility];  Service: General;  Laterality: N/A;  . COLPOSCOPY     colpo with biopsy  . WISDOM TOOTH EXTRACTION      OB History    Gravida Para Term Preterm AB Living   4 3 3   1 3    SAB TAB Ectopic Multiple Live Births   1     0 3      No Known Allergies  Social History   Social History  . Marital status: Married    Spouse name: N/A  . Number of children: N/A  .  Years of education: N/A   Social History Main Topics  . Smoking status: Former Smoker    Packs/day: 0.25    Years: 10.00    Types: Cigarettes  . Smokeless tobacco: Never Used  . Alcohol use No  . Drug use: No  . Sexual activity: Yes    Birth control/ protection: None, Pill   Other Topics Concern  . None   Social History Narrative  . None    Family History  Problem Relation Age of Onset  . Heart attack Father   . Heart disease Father   . Diabetes Maternal Uncle   . Dementia Maternal Grandmother   . Cancer Maternal Grandfather        throat, lung, bladder  . Diabetes Paternal Grandmother   . Heart attack Paternal Grandfather   . Heart disease Paternal Grandfather

## 2016-11-19 ENCOUNTER — Encounter: Payer: Self-pay | Admitting: Obstetrics & Gynecology

## 2016-11-19 ENCOUNTER — Ambulatory Visit (INDEPENDENT_AMBULATORY_CARE_PROVIDER_SITE_OTHER): Payer: 59 | Admitting: Obstetrics & Gynecology

## 2016-11-19 VITALS — BP 106/80 | HR 68 | Ht 69.0 in | Wt 189.0 lb

## 2016-11-19 DIAGNOSIS — N72 Inflammatory disease of cervix uteri: Secondary | ICD-10-CM | POA: Diagnosis not present

## 2016-11-19 DIAGNOSIS — N760 Acute vaginitis: Secondary | ICD-10-CM | POA: Diagnosis not present

## 2016-11-19 MED ORDER — MEDROXYPROGESTERONE ACETATE 150 MG/ML IM SUSP: 150.0000 mg | INTRAMUSCULAR | 3 refills | Status: DC

## 2016-11-19 NOTE — Progress Notes (Signed)
No chief complaint on file.   Blood pressure 106/80, pulse 68, height 5\' 9"  (1.753 m), weight 189 lb (85.7 kg), last menstrual period 11/08/2016, not currently breastfeeding.  31 y.o. [id number] Patient's last menstrual period was 11/08/2016. The current method of family planning is none.  Outpatient Encounter Prescriptions as of 11/19/2016  Medication Sig  . doxycycline (VIBRA-TABS) 100 MG tablet Take 1 tablet (100 mg total) by mouth 2 (two) times daily. (Patient not taking: Reported on 11/19/2016)  . Fluocinonide Emulsified Base 0.05 % CREA Apply 1 application topically 2 (two) times daily. (Patient not taking: Reported on 11/06/2016)  . medroxyPROGESTERone (DEPO-PROVERA) 150 MG/ML injection Inject 1 mL (150 mg total) into the muscle every 3 (three) months.  . metoCLOPramide (REGLAN) 10 MG tablet Take 1 tablet (10 mg total) by mouth 3 (three) times daily. (Patient not taking: Reported on 09/19/2016)  . norethindrone (MICRONOR,CAMILA,ERRIN) 0.35 MG tablet 1 tablet by mouth at same time daily (Patient not taking: Reported on 11/19/2016)  . Prenatal Vit-Fe Fumarate-FA (MULTIVITAMIN-PRENATAL) 27-0.8 MG TABS tablet Take 1 tablet by mouth at bedtime.   . valACYclovir (VALTREX) 500 MG tablet Take 500 mg by mouth 2 (two) times daily.   No facility-administered encounter medications on file as of 11/19/2016.     Subjective Patient is seen for follow-up for difficult to manage allergic vulvovaginitis originally thought to be secondary to birth control pills This happened to her one other time I was unsure if it could be virally mediated and placed her on Valtrex and also Lidex-E cream However then she began having more symptoms itching and irritation I subsequently saw her again on 11/06/2016 Wet prep revealed acute cervicitis and vaginitis heavy white blood cell infiltrate everything else was negative I placed her doxycycline twice a day for 10 days and I'm seeing her back now at 2 weeks  follow-up She began to feel better almost immediately the combination of stopping her pills and taking the doxycycline Today she is essentially asymptomatic both from irritation and itching and volume of discharge She of course is concerned about birth control method She finished her period 5 days ago and has not had intercourse March because of everything this been going on   Objective General WDWN female NAD Vulva:  normal appearing vulva with no masses, tenderness or lesions Vagina:  normal mucosa, no discharge, not even enough they are to do a wet prep today which is a dramatic difference from 2 weeks ago Cervix:  no cervical motion tenderness and no lesions Uterus:  normal size, contour, position, consistency, mobility, non-tender Adnexa: ovaries:present,  normal adnexa in size, nontender and no masses   Pertinent ROS No burning with urination, frequency or urgency No nausea, vomiting or diarrhea Nor fever chills or other constitutional symptoms   Labs or studies No new    Impression Diagnoses this Encounter::   ICD-9-CM ICD-10-CM   1. Acute vaginitis 616.10 N76.0    resolved today  2. Cervicitis 616.0 N72    resolved    Established relevant diagnosis(es):   Plan/Recommendations: Meds ordered this encounter  Medications  . medroxyPROGESTERone (DEPO-PROVERA) 150 MG/ML injection    Sig: Inject 1 mL (150 mg total) into the muscle every 3 (three) months.    Dispense:  1 mL    Refill:  3    Labs or Scans Ordered: No orders of the defined types were placed in this encounter.   Management:: No further therapy needed for the vulvovaginitis or cervicitis  vaginitis She was supposed to get femdophius was but has not yet She will start birth control but doesn't want to do the pill so we will do the Depo-Provera shot and a prescription sent to her pharmacy for that She thinks her sister can give her the shot but if she can't she'll call and we'll give it to her  here  Follow up Return if symptoms worsen or fail to improve, for pt may want to come in for a depo shot.        Face to face time:  15 minutes  Greater than 50% of the visit time was spent in counseling and coordination of care with the patient.  The summary and outline of the counseling and care coordination is summarized in the note above.   All questions were answered.  Past Medical History:  Diagnosis Date  . Abnormal Pap smear of cervix   . Herpes 10/12/2013  . HSV-2 (herpes simplex virus 2) infection   . Hx of gonorrhea   . Pregnant 06/23/2015    Past Surgical History:  Procedure Laterality Date  . CHOLECYSTECTOMY N/A 01/02/2015   Procedure: LAPAROSCOPIC CHOLECYSTECTOMY;  Surgeon: Franky Macho, MD;  Location: [facility];  Service: General;  Laterality: N/A;  . COLPOSCOPY     colpo with biopsy  . WISDOM TOOTH EXTRACTION      OB History    Gravida Para Term Preterm AB Living   4 3 3   1 3    SAB TAB Ectopic Multiple Live Births   1     0 3      No Known Allergies  Social History   Social History  . Marital status: Married    Spouse name: N/A  . Number of children: N/A  . Years of education: N/A   Social History Main Topics  . Smoking status: Current Some Day Smoker    Packs/day: 0.25    Years: 10.00    Types: Cigarettes  . Smokeless tobacco: Never Used  . Alcohol use No  . Drug use: No  . Sexual activity: Not Currently    Birth control/ protection: None   Other Topics Concern  . None   Social History Narrative  . None    Family History  Problem Relation Age of Onset  . Heart attack Father   . Heart disease Father   . Diabetes Maternal Uncle   . Dementia Maternal Grandmother   . Cancer Maternal Grandfather        throat, lung, bladder  . Diabetes Paternal Grandmother   . Heart attack Paternal Grandfather   . Heart disease Paternal Grandfather

## 2016-11-21 ENCOUNTER — Ambulatory Visit: Payer: 59 | Admitting: Obstetrics & Gynecology

## 2016-12-31 ENCOUNTER — Telehealth: Payer: Self-pay | Admitting: *Deleted

## 2016-12-31 NOTE — Telephone Encounter (Signed)
Patient states she has been having light bleeding mostly for the past 13 days. She is not having vaginal irritation anymore. Informed patient that bleeding is not uncommon with the Depo or with any birth control. Advised to give it a little longer and to let us know, verbalized understanding.

## 2017-02-12 ENCOUNTER — Ambulatory Visit (INDEPENDENT_AMBULATORY_CARE_PROVIDER_SITE_OTHER): Payer: 59

## 2017-02-12 VITALS — Wt 185.0 lb

## 2017-02-12 DIAGNOSIS — Z3202 Encounter for pregnancy test, result negative: Secondary | ICD-10-CM

## 2017-02-12 DIAGNOSIS — Z3042 Encounter for surveillance of injectable contraceptive: Secondary | ICD-10-CM

## 2017-02-12 LAB — POCT URINE PREGNANCY: PREG TEST UR: NEGATIVE

## 2017-02-12 MED ORDER — MEDROXYPROGESTERONE ACETATE 150 MG/ML IM SUSP
150.0000 mg | Freq: Once | INTRAMUSCULAR | Status: AC
  Administered 2017-02-12: 150 mg via INTRAMUSCULAR

## 2017-02-12 NOTE — Progress Notes (Signed)
PT here for depo shot 150 mg IM given rt deltoid. Tolerated well. Return 12 week for next shot. Pad CMA

## 2017-05-07 ENCOUNTER — Ambulatory Visit (INDEPENDENT_AMBULATORY_CARE_PROVIDER_SITE_OTHER): Payer: 59 | Admitting: *Deleted

## 2017-05-07 ENCOUNTER — Ambulatory Visit: Payer: 59

## 2017-05-07 ENCOUNTER — Other Ambulatory Visit: Payer: Self-pay

## 2017-05-07 ENCOUNTER — Encounter: Payer: Self-pay | Admitting: *Deleted

## 2017-05-07 DIAGNOSIS — Z3202 Encounter for pregnancy test, result negative: Secondary | ICD-10-CM

## 2017-05-07 DIAGNOSIS — Z3042 Encounter for surveillance of injectable contraceptive: Secondary | ICD-10-CM | POA: Diagnosis not present

## 2017-05-07 LAB — POCT URINE PREGNANCY: Preg Test, Ur: NEGATIVE

## 2017-05-07 MED ORDER — MEDROXYPROGESTERONE ACETATE 150 MG/ML IM SUSP
150.0000 mg | Freq: Once | INTRAMUSCULAR | Status: AC
  Administered 2017-05-07: 150 mg via INTRAMUSCULAR

## 2017-05-07 NOTE — Progress Notes (Signed)
Pt given DepoProvera 150mg IM left deltoid without complications. Advised pt to return in 12 weeks for next injection.  

## 2017-07-30 ENCOUNTER — Ambulatory Visit (INDEPENDENT_AMBULATORY_CARE_PROVIDER_SITE_OTHER): Payer: 59 | Admitting: *Deleted

## 2017-07-30 VITALS — Wt 170.5 lb

## 2017-07-30 DIAGNOSIS — Z3042 Encounter for surveillance of injectable contraceptive: Secondary | ICD-10-CM

## 2017-07-30 DIAGNOSIS — Z3202 Encounter for pregnancy test, result negative: Secondary | ICD-10-CM

## 2017-07-30 LAB — POCT URINE PREGNANCY: PREG TEST UR: NEGATIVE

## 2017-07-30 MED ORDER — MEDROXYPROGESTERONE ACETATE 150 MG/ML IM SUSP
150.0000 mg | Freq: Once | INTRAMUSCULAR | Status: AC
  Administered 2017-07-30: 150 mg via INTRAMUSCULAR

## 2017-07-30 NOTE — Progress Notes (Signed)
Pt given depo provera 150 mg IM in right deltoid. Patient tolerated well. Pt to return in 12 weeks for next dose.

## 2017-08-25 ENCOUNTER — Ambulatory Visit (INDEPENDENT_AMBULATORY_CARE_PROVIDER_SITE_OTHER): Payer: 59 | Admitting: Adult Health

## 2017-08-25 ENCOUNTER — Encounter: Payer: Self-pay | Admitting: Adult Health

## 2017-08-25 VITALS — BP 124/60 | HR 78 | Ht 70.0 in | Wt 170.0 lb

## 2017-08-25 DIAGNOSIS — R102 Pelvic and perineal pain: Secondary | ICD-10-CM

## 2017-08-25 DIAGNOSIS — M545 Low back pain, unspecified: Secondary | ICD-10-CM

## 2017-08-25 DIAGNOSIS — R197 Diarrhea, unspecified: Secondary | ICD-10-CM

## 2017-08-25 DIAGNOSIS — Z01411 Encounter for gynecological examination (general) (routine) with abnormal findings: Secondary | ICD-10-CM | POA: Diagnosis not present

## 2017-08-25 DIAGNOSIS — Z01419 Encounter for gynecological examination (general) (routine) without abnormal findings: Secondary | ICD-10-CM

## 2017-08-25 DIAGNOSIS — Z3042 Encounter for surveillance of injectable contraceptive: Secondary | ICD-10-CM | POA: Diagnosis not present

## 2017-08-25 LAB — POCT URINALYSIS DIPSTICK
Blood, UA: NEGATIVE
GLUCOSE UA: NEGATIVE
KETONES UA: NEGATIVE
Leukocytes, UA: NEGATIVE
Nitrite, UA: NEGATIVE
Protein, UA: NEGATIVE

## 2017-08-25 MED ORDER — MEDROXYPROGESTERONE ACETATE 150 MG/ML IM SUSP: 150.0000 mg | INTRAMUSCULAR | 3 refills | Status: DC

## 2017-08-25 MED ORDER — DICYCLOMINE HCL 10 MG PO CAPS: 10.0000 mg | ORAL_CAPSULE | Freq: Three times a day (TID) | ORAL | 1 refills | Status: DC

## 2017-08-25 MED ORDER — IBUPROFEN 800 MG PO TABS: 800.0000 mg | ORAL_TABLET | Freq: Three times a day (TID) | ORAL | 1 refills | Status: DC | PRN

## 2017-08-25 MED ORDER — CYCLOBENZAPRINE HCL 5 MG PO TABS: 5.0000 mg | ORAL_TABLET | Freq: Three times a day (TID) | ORAL | 0 refills | Status: DC | PRN

## 2017-08-25 NOTE — Progress Notes (Signed)
History of Present Illness: [name] is a 32 year old white female, married, G4P3 in for well woman gyn exam, she had normal pap with negative HPV 07/13/15.  She is on depo. No current PCP.  Current Medications, Allergies, Past Medical History, Past Surgical History, Family History and Social History were reviewed in [facility] electronic medical record.     Review of Systems:  Patient denies any headaches, hearing loss, fatigue, blurred vision, shortness of breath, chest pain, problems with urination, or intercourse(not active at present). No joint pain or mood swings.No sex drive, since has 2 toddlers. +abdominal pain and diarrhea after eating, since gallbladder removed, has lost about 20 lbs in last year, and has had low back pain for about 3 weeks.    Physical Exam:BP 124/60 (BP Location: Left Arm, Patient Position: Sitting, Cuff Size: Normal)   Pulse 78   Ht 5\' 10"  (1.778 m)   Wt 170 lb (77.1 kg)   BMI 24.39 kg/m urine dipstick negative. General:  Well developed, well nourished, no acute distress Skin:  Warm and dry Neck:  Midline trachea, normal thyroid, good ROM, no lymphadenopathy Lungs; Clear to auscultation bilaterally Breast:  No dominant palpable mass, retraction, or nipple discharge Cardiovascular: Regular rate and rhythm Abdomen:  Soft, non tender, no hepatosplenomegaly. Tender low back at spine. Pelvic:  External genitalia is normal in appearance, no lesions.  The vagina is normal in appearance. Urethra has no lesions or masses. The cervix is bulbous.  Uterus is felt to be normal size, shape, and contour.  No adnexal masses or tenderness noted.Bladder is non tender, no masses felt. Extremities/musculoskeletal:  No swelling or varicosities noted, no clubbing or cyanosis Psych:  No mood changes, alert and cooperative,seems happy PHQ 2 score 0. Will try bentyl before meals and will try ice, muscle relaxant and motrin for back.  Impression: 1. Encounter for well woman  exam with routine gynecological exam   2. Low back pain without sciatica, unspecified back pain laterality, unspecified chronicity   3. Diarrhea, unspecified type   4. Encounter for surveillance of injectable contraceptive       Plan: Meds ordered this encounter  Medications  . medroxyPROGESTERone (DEPO-PROVERA) 150 MG/ML injection    Sig: Inject 1 mL (150 mg total) into the muscle every 3 (three) months.    Dispense:  1 mL    Refill:  3    Order Specific Question:   Supervising Provider    Answer:   EURE, LUTHER H [[id number]]  . cyclobenzaprine (FLEXERIL) 5 MG tablet    Sig: Take 1 tablet (5 mg total) by mouth 3 (three) times daily as needed for muscle spasms.    Dispense:  30 tablet    Refill:  0    Order Specific Question:   Supervising Provider    Answer:   Despina HiddenEURE, LUTHER H [[id number]]  . dicyclomine (BENTYL) 10 MG capsule    Sig: Take 1 capsule (10 mg total) by mouth 3 (three) times daily before meals.    Dispense:  90 capsule    Refill:  1    Order Specific Question:   Supervising Provider    Answer:   Despina HiddenEURE, LUTHER H [[id number]]  . ibuprofen (ADVIL,MOTRIN) 800 MG tablet    Sig: Take 1 tablet (800 mg total) by mouth every 8 (eight) hours as needed.    Dispense:  60 tablet    Refill:  1    Order Specific Question:   Supervising Provider    Answer:  EURE, LUTHER H [[id number]]  Try ice to lower back,10 minutes 2-3 x daily F/U in 3 weeks, if not feeling better will refer to GI and get labs  Pap and physical in 1 year Review handouts on IBS and back pain      Patient ID: CARLISE STOFER, female   DOB: Oct 11, 1985, 32 y.o.   MRN: [id number]

## 2017-08-25 NOTE — Patient Instructions (Signed)
Back Pain, Adult Back pain is very common. The pain often gets better over time. The cause of back pain is usually not dangerous. Most people can learn to manage their back pain on their own. Follow these instructions at home: Watch your back pain for any changes. The following actions may help to lessen any pain you are feeling:  Stay active. Start with short walks on flat ground if you can. Try to walk farther each day.  Exercise regularly as told by your doctor. Exercise helps your back heal faster. It also helps avoid future injury by keeping your muscles strong and flexible.  Do not sit, drive, or stand in one place for more than 30 minutes.  Do not stay in bed. Resting more than 1-2 days can slow down your recovery.  Be careful when you bend or lift an object. Use good form when lifting: ? Bend at your knees. ? Keep the object close to your body. ? Do not twist.  Sleep on a firm mattress. Lie on your side, and bend your knees. If you lie on your back, put a pillow under your knees.  Take medicines only as told by your doctor.  Put ice on the injured area. ? Put ice in a plastic bag. ? Place a towel between your skin and the bag. ? Leave the ice on for 20 minutes, 2-3 times a day for the first 2-3 days. After that, you can switch between ice and heat packs.  Avoid feeling anxious or stressed. Find good ways to deal with stress, such as exercise.  Maintain a healthy weight. Extra weight puts stress on your back.  Contact a doctor if:  You have pain that does not go away with rest or medicine.  You have worsening pain that goes down into your legs or buttocks.  You have pain that does not get better in one week.  You have pain at night.  You lose weight.  You have a fever or chills. Get help right away if:  You cannot control when you poop (bowel movement) or pee (urinate).  Your arms or legs feel weak.  Your arms or legs lose feeling (numbness).  You feel sick  to your stomach (nauseous) or throw up (vomit).  You have belly (abdominal) pain.  You feel like you may [place] out (faint). This information is not intended to replace advice given to you by your health care provider. Make sure you discuss any questions you have with your health care provider. Document Released: 11/20/2007 Document Revised: 11/09/2015 Document Reviewed: 10/05/2013 Elsevier Interactive Patient Education  2018 [employer] Diet for Irritable Bowel Syndrome When you have irritable bowel syndrome (IBS), the foods you eat and your eating habits are very important. IBS may cause various symptoms, such as abdominal pain, constipation, or diarrhea. Choosing the right foods can help ease discomfort caused by these symptoms. Work with your health care provider and dietitian to find the best eating plan to help control your symptoms. What general guidelines do I need to follow?  Keep a food diary. This will help you identify foods that cause symptoms. Write down: ? What you eat and when. ? What symptoms you have. ? When symptoms occur in relation to your meals.  Avoid foods that cause symptoms. Talk with your dietitian about other ways to get the same nutrients that are in these foods.  Eat more foods that contain fiber. Take a fiber supplement if directed by your dietitian.  Eat  your meals slowly, in a relaxed setting.  Aim to eat 5-6 small meals per day. Do not skip meals.  Drink enough fluids to keep your urine clear or pale yellow.  Ask your health care provider if you should take an over-the-counter probiotic during flare-ups to help restore healthy gut bacteria.  If you have cramping or diarrhea, try making your meals low in fat and high in carbohydrates. Examples of carbohydrates are pasta, rice, whole grain breads and cereals, fruits, and vegetables.  If dairy products cause your symptoms to flare up, try eating less of them. You might be able to handle yogurt better  than other dairy products because it contains bacteria that help with digestion. What foods are not recommended? The following are some foods and drinks that may worsen your symptoms:  Fatty foods, such as [place] fries.  Milk products, such as cheese or ice cream.  Chocolate.  Alcohol.  Products with caffeine, such as coffee.  Carbonated drinks, such as soda.  The items listed above may not be a complete list of foods and beverages to avoid. Contact your dietitian for more information. What foods are good sources of fiber? Your health care provider or dietitian may recommend that you eat more foods that contain fiber. Fiber can help reduce constipation and other IBS symptoms. Add foods with fiber to your diet a little at a time so that your body can get used to them. Too much fiber at once might cause gas and swelling of your abdomen. The following are some foods that are good sources of fiber:  Apples.  Peaches.  Pears.  Berries.  Figs.  Broccoli (raw).  Cabbage.  Carrots.  Raw peas.  Kidney beans.  Lima beans.  Whole grain bread.  Whole grain cereal.  Where to find more information: [employer] for Functional Gastrointestinal Disorders: www.iffgd.[employer] of Diabetes and Digestive and Kidney Diseases: http://norris-lawson.com/.aspx This information is not intended to replace advice given to you by your health care provider. Make sure you discuss any questions you have with your health care provider. Document Released: 08/24/2003 Document Revised: 11/09/2015 Document Reviewed: 09/03/2013 Elsevier Interactive Patient Education  2018 [employer].

## 2017-09-15 ENCOUNTER — Ambulatory Visit: Payer: 59 | Admitting: Adult Health

## 2017-10-02 ENCOUNTER — Ambulatory Visit (INDEPENDENT_AMBULATORY_CARE_PROVIDER_SITE_OTHER): Payer: 59 | Admitting: Adult Health

## 2017-10-02 ENCOUNTER — Encounter: Payer: Self-pay | Admitting: Adult Health

## 2017-10-02 VITALS — BP 110/80 | HR 97 | Ht 70.0 in | Wt 169.0 lb

## 2017-10-02 DIAGNOSIS — K580 Irritable bowel syndrome with diarrhea: Secondary | ICD-10-CM | POA: Diagnosis not present

## 2017-10-02 MED ORDER — DICYCLOMINE HCL 10 MG PO CAPS: 10.0000 mg | ORAL_CAPSULE | Freq: Three times a day (TID) | ORAL | 3 refills | Status: DC

## 2017-10-02 NOTE — Patient Instructions (Signed)
Irritable Bowel Syndrome, Adult Irritable bowel syndrome (IBS) is not one specific disease. It is a group of symptoms that affects the organs responsible for digestion (gastrointestinal or GI tract). To regulate how your GI tract works, your body sends signals back and forth between your intestines and your brain. If you have IBS, there may be a problem with these signals. As a result, your GI tract does not function normally. Your intestines may become more sensitive and overreact to certain things. This is especially true when you eat certain foods or when you are under stress. There are four types of IBS. These may be determined based on the consistency of your stool:  IBS with diarrhea.  IBS with constipation.  Mixed IBS.  Unsubtyped IBS.  It is important to know which type of IBS you have. Some treatments are more likely to be helpful for certain types of IBS. What are the causes? The exact cause of IBS is not known. What increases the risk? You may have a higher risk of IBS if:  You are a woman.  You are younger than 34 years old.  You have a family history of IBS.  You have mental health problems.  You have had bacterial infection of your GI tract.  What are the signs or symptoms? Symptoms of IBS vary from person to person. The main symptom is abdominal pain or discomfort. Additional symptoms usually include one or more of the following:  Diarrhea, constipation, or both.  Abdominal swelling or bloating.  Feeling full or sick after eating a small or regular-size meal.  Frequent gas.  Mucus in the stool.  A feeling of having more stool left after a bowel movement.  Symptoms tend to come and go. They may be associated with stress, psychiatric conditions, or nothing at all. How is this diagnosed? There is no specific test to diagnose IBS. Your health care provider will make a diagnosis based on a physical exam, medical history, and your symptoms. You may have other  tests to rule out other conditions that may be causing your symptoms. These may include:  Blood tests.  X-rays.  CT scan.  Endoscopy and colonoscopy. This is a test in which your GI tract is viewed with a long, thin, flexible tube.  How is this treated? There is no cure for IBS, but treatment can help relieve symptoms. IBS treatment often includes:  Changes to your diet, such as: ? Eating more fiber. ? Avoiding foods that cause symptoms. ? Drinking more water. ? Eating regular, medium-sized portioned meals.  Medicines. These may include: ? Fiber supplements if you have constipation. ? Medicine to control diarrhea (antidiarrheal medicines). ? Medicine to help control muscle spasms in your GI tract (antispasmodic medicines). ? Medicines to help with any mental health issues, such as antidepressants or tranquilizers.  Therapy. ? Talk therapy may help with anxiety, depression, or other mental health issues that can make IBS symptoms worse.  Stress reduction. ? Managing your stress can help keep symptoms under control.  Follow these instructions at home:  Take medicines only as directed by your health care provider.  Eat a healthy diet. ? Avoid foods and drinks with added sugar. ? Include more whole grains, fruits, and vegetables gradually into your diet. This may be especially helpful if you have IBS with constipation. ? Avoid any foods and drinks that make your symptoms worse. These may include dairy products and caffeinated or carbonated drinks. ? Do not eat large meals. ? Drink enough   fluid to keep your urine clear or pale yellow.  Exercise regularly. Ask your health care provider for recommendations of good activities for you.  Keep all follow-up visits as directed by your health care provider. This is important. Contact a health care provider if:  You have constant pain.  You have trouble or pain with swallowing.  You have worsening diarrhea. Get help right away  if:  You have severe and worsening abdominal pain.  You have diarrhea and: ? You have a rash, stiff neck, or severe headache. ? You are irritable, sleepy, or difficult to awaken. ? You are weak, dizzy, or extremely thirsty.  You have bright red blood in your stool or you have black tarry stools.  You have unusual abdominal swelling that is painful.  You vomit continuously.  You vomit blood (hematemesis).  You have both abdominal pain and a fever. This information is not intended to replace advice given to you by your health care provider. Make sure you discuss any questions you have with your health care provider. Document Released: 06/03/2005 Document Revised: 11/03/2015 Document Reviewed: 02/18/2014 Elsevier Interactive Patient Education  2018 [employer] Inc. Diet for Irritable Bowel Syndrome When you have irritable bowel syndrome (IBS), the foods you eat and your eating habits are very important. IBS may cause various symptoms, such as abdominal pain, constipation, or diarrhea. Choosing the right foods can help ease discomfort caused by these symptoms. Work with your health care provider and dietitian to find the best eating plan to help control your symptoms. What general guidelines do I need to follow?  Keep a food diary. This will help you identify foods that cause symptoms. Write down: ? What you eat and when. ? What symptoms you have. ? When symptoms occur in relation to your meals.  Avoid foods that cause symptoms. Talk with your dietitian about other ways to get the same nutrients that are in these foods.  Eat more foods that contain fiber. Take a fiber supplement if directed by your dietitian.  Eat your meals slowly, in a relaxed setting.  Aim to eat 5-6 small meals per day. Do not skip meals.  Drink enough fluids to keep your urine clear or pale yellow.  Ask your health care provider if you should take an over-the-counter probiotic during flare-ups to help restore  healthy gut bacteria.  If you have cramping or diarrhea, try making your meals low in fat and high in carbohydrates. Examples of carbohydrates are pasta, rice, whole grain breads and cereals, fruits, and vegetables.  If dairy products cause your symptoms to flare up, try eating less of them. You might be able to handle yogurt better than other dairy products because it contains bacteria that help with digestion. What foods are not recommended? The following are some foods and drinks that may worsen your symptoms:  Fatty foods, such as French fries.  Milk products, such as cheese or ice cream.  Chocolate.  Alcohol.  Products with caffeine, such as coffee.  Carbonated drinks, such as soda.  The items listed above may not be a complete list of foods and beverages to avoid. Contact your dietitian for more information. What foods are good sources of fiber? Your health care provider or dietitian may recommend that you eat more foods that contain fiber. Fiber can help reduce constipation and other IBS symptoms. Add foods with fiber to your diet a little at a time so that your body can get used to them. Too much fiber at once   might cause gas and swelling of your abdomen. The following are some foods that are good sources of fiber:  Apples.  Peaches.  Pears.  Berries.  Figs.  Broccoli (raw).  Cabbage.  Carrots.  Raw peas.  Kidney beans.  Lima beans.  Whole grain bread.  Whole grain cereal.  Where to find more information: [employer] Foundation for Functional Gastrointestinal Disorders: www.iffgd.org National Institute of Diabetes and Digestive and Kidney Diseases: www.niddk.nih.gov/health-information/health-topics/digestive-diseases/ibs/Pages/facts.aspx This information is not intended to replace advice given to you by your health care provider. Make sure you discuss any questions you have with your health care provider. Document Released: 08/24/2003 Document Revised:  11/09/2015 Document Reviewed: 09/03/2013 Elsevier Interactive Patient Education  2018 Elsevier Inc.  

## 2017-10-02 NOTE — Progress Notes (Signed)
Subjective:     Patient ID: Stefanie [name], female   DOB: 26-Apr-1986, 32 y.o.   MRN: [id number]  HPI Stefanie [name] is a 32 year old white female, married, back in follow up of having back pain, and abdominal pain with diarrhea after eating, and is much better.   Review of Systems Feels much better, still has some diarrhea Back is good now,no pain Reviewed past medical,surgical, social and family history. Reviewed medications and allergies.     Objective:   Physical Exam BP 110/80 (BP Location: Left Arm, Patient Position: Sitting, Cuff Size: Small)   Pulse 97   Ht 5\' 10"  (1.778 m)   Wt 169 lb (76.7 kg)   BMI 24.25 kg/m    talk only, she declines referral to GI at this time, will increase bentyl to qid, and give copy IBS diet.  Assessment:     1. Irritable bowel syndrome with diarrhea   2. Back pain resolved     Plan:    Can take imodium if needed for diarrhea  Meds ordered this encounter  Medications  . dicyclomine (BENTYL) 10 MG capsule    Sig: Take 1 capsule (10 mg total) by mouth 4 (four) times daily -  before meals and at bedtime.    Dispense:  120 capsule    Refill:  3    Order Specific Question:   Supervising Provider    Answer:   Lazaro ArmsEURE, LUTHER H [[id number]]  Review handouts on IBS-D and IBS diet F/U prn

## 2017-10-23 ENCOUNTER — Ambulatory Visit (INDEPENDENT_AMBULATORY_CARE_PROVIDER_SITE_OTHER): Payer: 59 | Admitting: *Deleted

## 2017-10-23 ENCOUNTER — Encounter: Payer: Self-pay | Admitting: *Deleted

## 2017-10-23 ENCOUNTER — Other Ambulatory Visit: Payer: Self-pay

## 2017-10-23 DIAGNOSIS — Z3202 Encounter for pregnancy test, result negative: Secondary | ICD-10-CM | POA: Diagnosis not present

## 2017-10-23 DIAGNOSIS — Z3042 Encounter for surveillance of injectable contraceptive: Secondary | ICD-10-CM | POA: Diagnosis not present

## 2017-10-23 LAB — POCT URINE PREGNANCY: Preg Test, Ur: NEGATIVE

## 2017-10-23 MED ORDER — MEDROXYPROGESTERONE ACETATE 150 MG/ML IM SUSP
150.0000 mg | Freq: Once | INTRAMUSCULAR | Status: AC
  Administered 2017-10-23: 150 mg via INTRAMUSCULAR

## 2017-10-23 NOTE — Progress Notes (Signed)
Pt given DepoProvera  IM Left deltoid without complications. Advised to return in 12 weeks for next injection.

## 2018-01-07 ENCOUNTER — Other Ambulatory Visit: Payer: Self-pay | Admitting: Adult Health

## 2018-01-15 ENCOUNTER — Ambulatory Visit: Payer: 59

## 2018-01-16 ENCOUNTER — Ambulatory Visit (INDEPENDENT_AMBULATORY_CARE_PROVIDER_SITE_OTHER): Payer: 59

## 2018-01-16 VITALS — Ht 70.0 in | Wt 168.4 lb

## 2018-01-16 DIAGNOSIS — Z3042 Encounter for surveillance of injectable contraceptive: Secondary | ICD-10-CM

## 2018-01-16 DIAGNOSIS — Z3202 Encounter for pregnancy test, result negative: Secondary | ICD-10-CM

## 2018-01-16 LAB — POCT URINE PREGNANCY: PREG TEST UR: NEGATIVE

## 2018-01-16 MED ORDER — MEDROXYPROGESTERONE ACETATE 150 MG/ML IM SUSP
150.0000 mg | Freq: Once | INTRAMUSCULAR | Status: AC
  Administered 2018-01-16: 150 mg via INTRAMUSCULAR

## 2018-01-16 NOTE — Progress Notes (Signed)
Pt here for depo injection 150 mg IM given rt deltoid. Tolerated well. Return 12 weeks for next injection. Pad CMA 

## 2018-01-27 ENCOUNTER — Encounter: Payer: Self-pay | Admitting: Family Medicine

## 2018-01-27 ENCOUNTER — Ambulatory Visit (INDEPENDENT_AMBULATORY_CARE_PROVIDER_SITE_OTHER): Payer: 59 | Admitting: Family Medicine

## 2018-01-27 VITALS — Temp 98.3°F | Ht 70.0 in | Wt 164.8 lb

## 2018-01-27 DIAGNOSIS — J310 Chronic rhinitis: Secondary | ICD-10-CM

## 2018-01-27 DIAGNOSIS — J329 Chronic sinusitis, unspecified: Secondary | ICD-10-CM | POA: Diagnosis not present

## 2018-01-27 MED ORDER — AZITHROMYCIN 250 MG PO TABS: ORAL_TABLET | ORAL | 0 refills | Status: DC

## 2018-01-27 MED ORDER — ONDANSETRON 4 MG PO TBDP: 4.0000 mg | ORAL_TABLET | Freq: Three times a day (TID) | ORAL | 0 refills | Status: DC | PRN

## 2018-01-27 NOTE — Progress Notes (Signed)
   Subjective:    Patient ID: Stefanie [name], female    DOB: [birth year], 32 y.o.   MRN: [id number]  Cough  This is a new problem. The current episode started in the past 7 days. Associated symptoms include headaches and nasal congestion. Treatments tried: mucinex and tylenol.   pts daught er came in last ek with sickness   One wk ago develped throat symtom  Has ben taking  Nauseated. No energy , nio appetite  No fever   Of an   o nausea meds  Heat made y  Her fel ba          Review of Systems  Respiratory: Positive for cough.   Neurological: Positive for headaches.       Objective:   Physical Exam  Alert, mild malaise. Hydration good Vitals stable. frontal/ maxillary tenderness evident positive nasal congestion. pharynx normal neck supple  lungs clear/no crackles or wheezes. heart regular in rhythm       Assessment & Plan:  Impression rhinosinusitis likely post viral, discussed with patient. plan antibiotics prescribed. Questions answered. Symptomatic care discussed. warning signs discussed. WSL

## 2018-03-23 ENCOUNTER — Ambulatory Visit (INDEPENDENT_AMBULATORY_CARE_PROVIDER_SITE_OTHER): Payer: 59 | Admitting: Family Medicine

## 2018-03-23 ENCOUNTER — Encounter: Payer: Self-pay | Admitting: Family Medicine

## 2018-03-23 VITALS — BP 102/68 | Temp 98.2°F | Ht 70.0 in | Wt 163.0 lb

## 2018-03-23 DIAGNOSIS — R21 Rash and other nonspecific skin eruption: Secondary | ICD-10-CM

## 2018-03-23 MED ORDER — MOMETASONE FUROATE 0.1 % EX CREA: TOPICAL_CREAM | CUTANEOUS | 1 refills | Status: DC

## 2018-03-23 MED ORDER — PERMETHRIN 5 % EX CREA: 1.0000 "application " | TOPICAL_CREAM | Freq: Once | CUTANEOUS | 0 refills | Status: AC

## 2018-03-23 NOTE — Progress Notes (Signed)
   Subjective:    Patient ID: Stefanie [name], female    DOB: 04/26/1986, 32 y.o.   MRN: [id number]  Rash  This is a new problem. Episode onset: one and a half weeks. Location: all over. The rash is characterized by itchiness. She was exposed to nothing. Treatments tried: dial anti bacterial soap, gold bond lotion.   4 years ago pt developed a sensitivity to the sun but she states she has not been out in the sun.    Review of Systems  Skin: Positive for rash.  She denies she any type of headache fevers chills tick bites denies chest pain shortness of breath nausea vomiting diarrhea     Objective:   Physical Exam Papular rash noted on the abdomen in multiple spots some are excoriated some on the arms some on the legs no sign of any folliculitis does not appear to be shingles or chickenpox  She does not appear to be in any distress     Assessment & Plan:  Possible scabies Recommend Elimite cream Elocon as needed If not improving over the next few weeks notify us we will help set up with dermatology

## 2018-04-10 ENCOUNTER — Ambulatory Visit (INDEPENDENT_AMBULATORY_CARE_PROVIDER_SITE_OTHER): Payer: 59 | Admitting: *Deleted

## 2018-04-10 ENCOUNTER — Encounter: Payer: Self-pay | Admitting: *Deleted

## 2018-04-10 ENCOUNTER — Other Ambulatory Visit: Payer: Self-pay

## 2018-04-10 DIAGNOSIS — Z3202 Encounter for pregnancy test, result negative: Secondary | ICD-10-CM

## 2018-04-10 DIAGNOSIS — Z3042 Encounter for surveillance of injectable contraceptive: Secondary | ICD-10-CM

## 2018-04-10 LAB — POCT URINE PREGNANCY: Preg Test, Ur: NEGATIVE

## 2018-04-10 MED ORDER — MEDROXYPROGESTERONE ACETATE 150 MG/ML IM SUSP
150.0000 mg | Freq: Once | INTRAMUSCULAR | Status: AC
  Administered 2018-04-10: 150 mg via INTRAMUSCULAR

## 2018-04-10 NOTE — Progress Notes (Signed)
Pt given DepoProvera 150mg IM left deltoid without complications. Advised to return in 12 weeks for next injection. 

## 2018-04-13 ENCOUNTER — Telehealth: Payer: Self-pay | Admitting: *Deleted

## 2018-04-13 MED ORDER — VALACYCLOVIR HCL 500 MG PO TABS
500.0000 mg | ORAL_TABLET | Freq: Two times a day (BID) | ORAL | 12 refills | Status: DC
Start: 2018-04-13 — End: 2018-07-29

## 2018-04-13 NOTE — Telephone Encounter (Signed)
Refill valtrex 

## 2018-04-13 NOTE — Telephone Encounter (Signed)
Pt requests refill on valtrex. Advised that I would send her request to a provider and she could check with her pharmacy in the next 24 hours.

## 2018-06-11 ENCOUNTER — Encounter: Payer: Self-pay | Admitting: Family Medicine

## 2018-07-03 ENCOUNTER — Ambulatory Visit: Payer: 59

## 2018-07-06 ENCOUNTER — Ambulatory Visit (INDEPENDENT_AMBULATORY_CARE_PROVIDER_SITE_OTHER): Payer: 59 | Admitting: Family Medicine

## 2018-07-06 ENCOUNTER — Encounter: Payer: Self-pay | Admitting: Family Medicine

## 2018-07-06 ENCOUNTER — Ambulatory Visit (INDEPENDENT_AMBULATORY_CARE_PROVIDER_SITE_OTHER): Payer: 59 | Admitting: *Deleted

## 2018-07-06 ENCOUNTER — Encounter: Payer: Self-pay | Admitting: *Deleted

## 2018-07-06 VITALS — BP 120/82 | Temp 97.8°F | Ht 70.0 in | Wt 160.0 lb

## 2018-07-06 DIAGNOSIS — Z3042 Encounter for surveillance of injectable contraceptive: Secondary | ICD-10-CM

## 2018-07-06 DIAGNOSIS — Z308 Encounter for other contraceptive management: Secondary | ICD-10-CM

## 2018-07-06 DIAGNOSIS — Z3202 Encounter for pregnancy test, result negative: Secondary | ICD-10-CM | POA: Diagnosis not present

## 2018-07-06 DIAGNOSIS — J019 Acute sinusitis, unspecified: Secondary | ICD-10-CM | POA: Diagnosis not present

## 2018-07-06 DIAGNOSIS — B9689 Other specified bacterial agents as the cause of diseases classified elsewhere: Secondary | ICD-10-CM

## 2018-07-06 LAB — POCT URINE PREGNANCY: PREG TEST UR: NEGATIVE

## 2018-07-06 MED ORDER — AMOXICILLIN-POT CLAVULANATE 875-125 MG PO TABS: 1.0000 | ORAL_TABLET | Freq: Two times a day (BID) | ORAL | 0 refills | Status: DC

## 2018-07-06 MED ORDER — MEDROXYPROGESTERONE ACETATE 150 MG/ML IM SUSP
150.0000 mg | Freq: Once | INTRAMUSCULAR | Status: AC
  Administered 2018-07-06: 150 mg via INTRAMUSCULAR

## 2018-07-06 NOTE — Progress Notes (Signed)
Pt here for Depo. Pt received shot in right deltoid. Pt tolerated shot well. Return in 12 weeks for next shot. JSY 

## 2018-07-06 NOTE — Progress Notes (Signed)
   Subjective:    Patient ID: Stefanie [name], female    DOB: 02-09-1986, 33 y.o.   MRN: [id number]  HPI  Patient is here today with complaints of a cough,sinus congestion,headache,sore throat,dry throat, no fever,body aches. This has been on going for the last week . She has been taking Otc sinus, and Alka seltzer.  Still has a rash on stomach. She states she told she had scabies.She has a dermatology appointment coming up in Feb.She states she was given a cream that she applied to the whole body and washed off the following am, and she still has the bumps that start off as a blister.She is the only one in the family with these blisters and she sleeps with the baby.  Patient started over a week ago with respiratory symptoms dry cough sinus pressure discomfort denies high fever chills sweats but then stated that it seemed to get better for couple days then came back worse with sinus pressure drainage coughing not feeling good Review of Systems  Constitutional: Negative for activity change and fever.  HENT: Positive for congestion and rhinorrhea. Negative for ear pain.   Eyes: Negative for discharge.  Respiratory: Positive for cough. Negative for shortness of breath and wheezing.   Cardiovascular: Negative for chest pain.       Objective:   Physical Exam Vitals signs and nursing note reviewed.  Constitutional:      Appearance: She is well-developed.  HENT:     Head: Normocephalic.     Nose: Nose normal.     Mouth/Throat:     Pharynx: No oropharyngeal exudate.  Neck:     Musculoskeletal: Neck supple.  Cardiovascular:     Rate and Rhythm: Normal rate.     Heart sounds: Normal heart sounds. No murmur.  Pulmonary:     Effort: Pulmonary effort is normal.     Breath sounds: Normal breath sounds. No wheezing.  Lymphadenopathy:     Cervical: No cervical adenopathy.  Skin:    General: Skin is warm and dry.    Nonspecific rash on her abdomen       Assessment & Plan:  Nonspecific  rash recommend evaluation by dermatology she already has an appointment in early February she will send [place] a copy of the results  Acute rhinosinusitis antibiotics prescribed warning signs discussed follow-up if progressive troubles or worse

## 2018-07-09 ENCOUNTER — Ambulatory Visit: Payer: 59

## 2018-07-20 ENCOUNTER — Telehealth: Payer: Self-pay | Admitting: Family Medicine

## 2018-07-20 MED ORDER — CEFPROZIL 500 MG PO TABS: 500.0000 mg | ORAL_TABLET | Freq: Two times a day (BID) | ORAL | 0 refills | Status: DC

## 2018-07-20 NOTE — Telephone Encounter (Signed)
Pt.notified

## 2018-07-20 NOTE — Telephone Encounter (Signed)
cefzil 500 bid ten d 

## 2018-07-20 NOTE — Telephone Encounter (Signed)
Prescription sent electronically to pharmacy. Left message to return call to notify patient. 

## 2018-07-20 NOTE — Telephone Encounter (Signed)
Pt was seen 07/06/18. Pt finished her antibiotic on Thursday. Once she finished the antibiotic she developed a scratchy throat and now is having congestion, cough and headache. No fever.   Pt is wanting to know if something else could be called in or if she needs to come in and be re seen.   If able to call something in [id number] - [place], [place] - [address]

## 2018-07-20 NOTE — Telephone Encounter (Signed)
Patient given Augmentin 07/06/18

## 2018-07-21 ENCOUNTER — Ambulatory Visit (INDEPENDENT_AMBULATORY_CARE_PROVIDER_SITE_OTHER): Payer: 59 | Admitting: Family Medicine

## 2018-07-21 ENCOUNTER — Encounter: Payer: Self-pay | Admitting: Family Medicine

## 2018-07-21 VITALS — Temp 98.0°F | Ht 70.0 in | Wt 155.6 lb

## 2018-07-21 DIAGNOSIS — J111 Influenza due to unidentified influenza virus with other respiratory manifestations: Secondary | ICD-10-CM | POA: Diagnosis not present

## 2018-07-21 NOTE — Progress Notes (Signed)
   Subjective:    Patient ID: Stefanie [name], female    DOB: 01/10/1986, 33 y.o.   MRN: [id number]  Cough  This is a new problem. The current episode started in the past 7 days. Associated symptoms include nasal congestion and wheezing. Associated symptoms comments: nausea. Treatments tried: cefzil.   Pt notes congestion in the chest  Still having  Bad symtomx  Pos smoking  Felt sob and potentially wheezy in he ches  Last Thursday at lunchtime got a tickly cough in the throat   By Saturday in the bed all day, no energy, did not have hfever, felt bad, Sunday fet better was up,  Exhausted   After appt yesterday    Review of Systems  Respiratory: Positive for cough and wheezing.        Objective:   Physical Exam  Alert vitals reviewed, moderate malaise. Hydration good. Positive nasal congestion lungs no crackles or wheezes, no tachypnea, intermittent bronchial cough during exam heart regular rate and rhythm.       Assessment & Plan:  fracture Impression influenza discussed at length. Ashby Dawes of illness and potential sequela discussed. Plan Tamiflu prescribed if indicated and timing appropriate. Symptom care discussed. Warning signs discussed. WSL Notes already on antibiotics due to prior protracted respiratory infection.  Symptoms of last several days true flu.  Too late to add Tamiflu rationale discussed

## 2018-07-29 ENCOUNTER — Ambulatory Visit (INDEPENDENT_AMBULATORY_CARE_PROVIDER_SITE_OTHER): Payer: 59 | Admitting: Adult Health

## 2018-07-29 ENCOUNTER — Other Ambulatory Visit (HOSPITAL_COMMUNITY)
Admission: RE | Admit: 2018-07-29 | Discharge: 2018-07-29 | Disposition: A | Discharge status: Home / Self Care | Payer: No Typology Code available for payment source | Source: Ambulatory Visit | Attending: Adult Health | Admitting: Adult Health

## 2018-07-29 ENCOUNTER — Encounter: Payer: Self-pay | Admitting: Adult Health

## 2018-07-29 VITALS — BP 105/72 | HR 86 | Ht 70.0 in | Wt 158.0 lb

## 2018-07-29 DIAGNOSIS — R21 Rash and other nonspecific skin eruption: Secondary | ICD-10-CM | POA: Insufficient documentation

## 2018-07-29 DIAGNOSIS — Z01419 Encounter for gynecological examination (general) (routine) without abnormal findings: Secondary | ICD-10-CM

## 2018-07-29 DIAGNOSIS — Z13220 Encounter for screening for lipoid disorders: Secondary | ICD-10-CM | POA: Insufficient documentation

## 2018-07-29 DIAGNOSIS — R634 Abnormal weight loss: Secondary | ICD-10-CM | POA: Insufficient documentation

## 2018-07-29 DIAGNOSIS — Z3042 Encounter for surveillance of injectable contraceptive: Secondary | ICD-10-CM | POA: Insufficient documentation

## 2018-07-29 MED ORDER — MINOCYCLINE HCL 100 MG PO TABS: 100.0000 mg | ORAL_TABLET | Freq: Two times a day (BID) | ORAL | 0 refills | Status: DC

## 2018-07-29 MED ORDER — MEDROXYPROGESTERONE ACETATE 150 MG/ML IM SUSP: 150.0000 mg | INTRAMUSCULAR | 3 refills | Status: DC

## 2018-07-29 NOTE — Progress Notes (Signed)
Patient ID: Stefanie [name], female   DOB: Jul 10, 1985, 33 y.o.   MRN: [id number] History of Present Illness: Stefanie [name] is a 33 year old white female, married, G4P3 in for well woman gyn exam and pap. PCP is [facility].    Current Medications, Allergies, Past Medical History, Past Surgical History, Family History and Social History were reviewed in [employer] record.     Review of Systems: Patient denies any headaches, hearing loss, fatigue, blurred vision, shortness of breath, chest pain, abdominal pain, problems with bowel movements, urination, or intercourse(not very active). No joint pain or mood swings. Has lost 30 lbs, was not trying, has generalized skin rash, she first thought it was skin pox, and saw PCP was treated for scabies, then saw dermatologist in [place] and had biopsy and told was dermatitis, has no itching or burning.  She is happy with depo.   Physical Exam:BP 105/72 (BP Location: Left Arm, Patient Position: Sitting, Cuff Size: Normal)   Pulse 86   Ht 5\' 10"  (1.778 m)   Wt 158 lb (71.7 kg)   BMI 22.67 kg/m  General:  Well developed, well nourished, no acute distress Skin:  Warm and dry, has generalized scabbed areas and small brown spots, esp buttocks Neck:  Midline trachea, normal thyroid, good ROM, no lymphadenopathy Lungs; Clear to auscultation bilaterally Breast:  No dominant palpable mass, retraction, or nipple discharge Cardiovascular: Regular rate and rhythm Abdomen:  Soft, non tender, no hepatosplenomegaly Pelvic:  External genitalia is normal in appearance, no lesions.  The vagina is normal in appearance. Urethra has no lesions or masses. The cervix is bulbous. Pap with HPV performed. Uterus is felt to be normal size, shape, and contour.  No adnexal masses or tenderness noted.Bladder is non tender, no masses felt. Extremities/musculoskeletal:  No swelling or varicosities noted, no clubbing or cyanosis, has bandaide above right knee where  biopsy taken. Psych:  No mood changes, alert and cooperative,seems happy Fall risk is low. PHQ 2 score 0. She requests fasting labs.  Examination chaperoned by Marchelle Folks Rash LPN.  Impression: 1. Encounter for gynecological examination with Papanicolaou smear of cervix   2. Skin rash   3. Screening cholesterol level   4. Unintended weight loss   5. Encounter for surveillance of injectable contraceptive       Plan: Check CBC,CMP,TSH and lipids Meds ordered this encounter  Medications  . minocycline (DYNACIN) 100 MG tablet    Sig: Take 1 tablet (100 mg total) by mouth 2 (two) times daily.    Dispense:  20 tablet    Refill:  0    Order Specific Question:   Supervising Provider    Answer:   Despina Hidden, LUTHER H [[id number]]  . medroxyPROGESTERone (DEPO-PROVERA) 150 MG/ML injection    Sig: Inject 1 mL (150 mg total) into the muscle every 3 (three) months.    Dispense:  1 mL    Refill:  3    Order Specific Question:   Supervising Provider    Answer:   Duane Lope H [[id number]]  Try cutting down on gluten F/U in 2 weeks Physical in 1 year Pap in 3 if normal

## 2018-07-30 LAB — CBC
HEMATOCRIT: 37.5 % (ref 34.0–46.6)
Hemoglobin: 12.9 g/dL (ref 11.1–15.9)
MCH: 32.8 pg (ref 26.6–33.0)
MCHC: 34.4 g/dL (ref 31.5–35.7)
MCV: 95 fL (ref 79–97)
Platelets: 196 10*3/uL (ref 150–450)
RBC: 3.93 x10E6/uL (ref 3.77–5.28)
RDW: 12 % (ref 11.7–15.4)
WBC: 5.5 10*3/uL (ref 3.4–10.8)

## 2018-07-30 LAB — COMPREHENSIVE METABOLIC PANEL
ALT: 21 IU/L (ref 0–32)
AST: 12 IU/L (ref 0–40)
Albumin/Globulin Ratio: 1.7 (ref 1.2–2.2)
Albumin: 4.4 g/dL (ref 3.8–4.8)
Alkaline Phosphatase: 68 IU/L (ref 39–117)
BUN/Creatinine Ratio: 23 (ref 9–23)
BUN: 18 mg/dL (ref 6–20)
Bilirubin Total: 0.3 mg/dL (ref 0.0–1.2)
CO2: 20 mmol/L (ref 20–29)
CREATININE: 0.78 mg/dL (ref 0.57–1.00)
Calcium: 9.1 mg/dL (ref 8.7–10.2)
Chloride: 107 mmol/L — ABNORMAL HIGH (ref 96–106)
GFR calc Af Amer: 115 mL/min/{1.73_m2} (ref >59)
GFR calc non Af Amer: 99 mL/min/{1.73_m2} (ref >59)
Globulin, Total: 2.6 g/dL (ref 1.5–4.5)
Glucose: 82 mg/dL (ref 65–99)
Potassium: 4 mmol/L (ref 3.5–5.2)
Sodium: 145 mmol/L — ABNORMAL HIGH (ref 134–144)
TOTAL PROTEIN: 7 g/dL (ref 6.0–8.5)

## 2018-07-30 LAB — TSH: TSH: 1.44 u[IU]/mL (ref 0.450–4.500)

## 2018-07-30 LAB — LIPID PANEL
CHOL/HDL RATIO: 3.2 ratio (ref 0.0–4.4)
Cholesterol, Total: 110 mg/dL (ref 100–199)
HDL: 34 mg/dL — ABNORMAL LOW (ref >39)
LDL Calculated: 66 mg/dL (ref 0–99)
Triglycerides: 48 mg/dL (ref 0–149)
VLDL Cholesterol Cal: 10 mg/dL (ref 5–40)

## 2018-07-31 LAB — CYTOLOGY - PAP
Diagnosis: NEGATIVE
HPV (WINDOPATH): NOT DETECTED

## 2018-08-18 ENCOUNTER — Ambulatory Visit: Payer: Self-pay | Admitting: Adult Health

## 2018-08-28 ENCOUNTER — Ambulatory Visit (INDEPENDENT_AMBULATORY_CARE_PROVIDER_SITE_OTHER): Payer: No Typology Code available for payment source | Admitting: Adult Health

## 2018-08-28 ENCOUNTER — Other Ambulatory Visit: Payer: Self-pay

## 2018-08-28 ENCOUNTER — Encounter: Payer: Self-pay | Admitting: Adult Health

## 2018-08-28 VITALS — BP 106/79 | HR 88 | Ht 70.0 in | Wt 156.2 lb

## 2018-08-28 DIAGNOSIS — N6311 Unspecified lump in the right breast, upper outer quadrant: Secondary | ICD-10-CM

## 2018-08-28 NOTE — Patient Instructions (Signed)
Systemic Lupus Erythematosus, Adult  Systemic lupus erythematosus (SLE) is a long-term (chronic) disease that can affect many parts of the body. SLE is an autoimmune disease. With this type of disease, the body's defense system (immune system) mistakenly attacks healthy tissues. This can cause damage to the skin, joints, blood vessels, brain, kidneys, lungs, heart, and other internal organs. It causes pain, irritation, and inflammation.  What are the causes?  The cause of this condition is not known.  What increases the risk?  The following factors may make you more likely to develop this condition:  · Being female.  · Being of Asian, Hispanic, or African-American descent.  · Having a family history of the condition.  · Being exposed to tobacco smoke or smoking cigarettes.  · Having an infection with a virus, such as Epstein-Barr virus.  · Having a history of exposure to silica dust, metals, chemicals, mold or mildew, or insecticides.  · Using oral contraceptives or hormone replacement therapy.  What are the signs or symptoms?  This condition can affect almost any organ or system in the body. Symptoms of the condition depend on which organ or system is affected.  The most common symptoms include:  · Fever.  · Fatigue.  · Weight loss.  · Muscle aches.  · Joint pain.  · Skin rashes, especially over the nose and cheeks (butterfly rash) and after sun exposure.  Symptoms can come and go. A period of time when symptoms get worse or come back is called a flare. A period of time with no symptoms is called a remission.  How is this diagnosed?  This condition is diagnosed based on:  · Your symptoms.  · Your medical history.  · A physical exam.  You may also have tests, including:  · Blood tests.  · Urine tests.  · A chest X-ray.  You may be referred to an autoimmune disease specialist (rheumatologist).  How is this treated?  There is no cure for this condition, but treatment can help to control symptoms, prevent flares (keep  symptoms in remission), and prevent damage to the heart, lungs, kidneys, and other organs. Treatment will depend on what symptoms you are having and what organs or systems are affected. Treatment may involve taking a combination of medicines over time.  Common medicines used to treat this condition include:  · Antimalarial medicines to control symptoms, prevent flares, and protect against organ damage.  · Corticosteroids and NSAIDs to reduce inflammation.  · Medicines to weaken your immune system (immunosuppressants).  · Biologic response modifiers to reduce inflammation and damage.  Follow these instructions at home:  Eating and drinking  · Eat a heart-healthy diet. This may include:  ? Eating high-fiber foods, such as fresh fruits and vegetables, whole grains, and beans.  ? Eating heart-healthy fats (omega-3 fats), such as fish, flaxseed, and flaxseed oil.  ? Limiting foods that are high in saturated fat and cholesterol, such as processed and fried foods, fatty meat, and full-fat dairy.  ? Limiting how much salt (sodium) you eat.  · Include calcium and vitamin D in your diet. Good sources of calcium and vitamin D include:  ? Low-fat dairy products such as milk, yogurt, and cheese.  ? Certain fish, such as fresh or canned salmon, tuna, and sardines.  ? Products that have calcium and vitamin D added to them (fortified products), such as fortified cereals or juice.  Medicines  · Take over-the-counter and prescription medicines only as told by your health   care provider.  · Do not take any medicines that contain estrogen without first checking with your health care provider. Estrogen can trigger flares and may increase your risk for blood clots.  Lifestyle         · Stay active, as directed by your health care provider.  · Do not use any products that contain nicotine or tobacco, such as cigarettes and e-cigarettes. If you need help quitting, ask your health care provider.  · Protect your skin from the sun by applying  sunblock and wearing protective hats and clothing.  · Learn as much as you can about your condition and have a good support system in place. Support may come from family, friends, or a lupus support group.  General instructions  · Work closely with all of your health care providers to manage your condition.  · Stay up to date on all vaccines as directed by your health care provider.  · Keep all follow-up visits as told by your health care provider. This is important.  Contact a health care provider if:  · You have a fever.  · Your symptoms flare.  · You develop new symptoms.  · You have bloody, foamy, or coffee-colored urine.  · There are changes in your urination. For example, you urinate more often at night.  · You think that you may be depressed or have anxiety.  · You become pregnant or plan to become pregnant. Pregnancy in women with this condition is considered high risk.  Get help right away if:  · You have chest pain.  · You have trouble breathing.  · You have a seizure.  · You suddenly get a very bad headache.  · You suddenly develop facial or body weakness.  · You cannot speak.  · You cannot understand speech.  These symptoms may represent a serious problem that is an emergency. Do not wait to see if the symptoms will go away. Get medical help right away. Call your local emergency services (911 in the [place] Do not drive yourself to the hospital.  Summary  · Systemic lupus erythematosus (SLE) is a long-term disease that can affect many parts of the body.  · SLE is an autoimmune disease. That means your body's defense system (immune system) mistakenly attacks healthy tissues.  · There is no cure for this condition, but treatment can help to control symptoms, prevent flares, and prevent damage to your organs. Treatment may involve taking a combination of medicines over time.  This information is not intended to replace advice given to you by your health care provider. Make sure you discuss any questions you  have with your health care provider.  Document Released: 05/24/2002 Document Revised: 07/11/2017 Document Reviewed: 07/11/2017  Elsevier Interactive Patient Education © 2019 Elsevier Inc.

## 2018-08-28 NOTE — Progress Notes (Signed)
Patient ID: Stefanie [name], female   DOB: 1985-10-10, 33 y.o.   MRN: [id number] History of Present Illness: Stefanie [name] is a 33 year old white female,married, in complaining of right breast has spot that is hard and tender.She saw dermatologist and had skin biopsy ?lupus and had +ANA, and has appt with Dr Dierdre Forth 09/09/2018, she is worried.She has googled lupus.Try to be positive and talk with Dr Dierdre Forth.  PCP is Dr Lilyan Punt.   Current Medications, Allergies, Past Medical History, Past Surgical History, Family History and Social History were reviewed in [employer] record.     Review of Systems: Right breast spot that feels hard, and tender    Physical Exam:BP 106/79 (BP Location: Right Arm, Patient Position: Sitting, Cuff Size: Normal)   Pulse 88   Ht 5\' 10"  (1.778 m)   Wt 156 lb 3.2 oz (70.9 kg)   BMI 22.41 kg/m  General:  Well developed, well nourished, no acute distress Skin:  Warm and dry Breast:  No dominant palpable mass, retraction, or nipple discharge on left, on right, no retraction, or nipple discharge, but has firm, tender 1 cm mass at 11 0'clock, 2 FB from areola  Psych:  No mood changes, alert and cooperative,seems happy Will get Diagnostic mammogram and [place].  Impression: 1. Mass of upper outer quadrant of right breast       Plan: Orders Placed This Encounter  Procedures  . US BREAST LTD UNI RIGHT INC AXILLA  . MM DIAG BREAST TOMO BILATERAL  . US BREAST LTD UNI LEFT INC AXILLA  Scheduled 09/15/2018 at 2 pm at [facility], put on call list F/U prn Review handout on lupus

## 2018-09-01 ENCOUNTER — Ambulatory Visit (HOSPITAL_COMMUNITY)
Admission: RE | Admit: 2018-09-01 | Discharge: 2018-09-01 | Disposition: A | Discharge status: Home / Self Care | Payer: No Typology Code available for payment source | Source: Ambulatory Visit | Attending: Adult Health | Admitting: Adult Health

## 2018-09-01 ENCOUNTER — Ambulatory Visit (HOSPITAL_COMMUNITY): Admission: RE | Admit: 2018-09-01 | Payer: No Typology Code available for payment source | Source: Ambulatory Visit

## 2018-09-01 ENCOUNTER — Other Ambulatory Visit: Payer: Self-pay

## 2018-09-01 DIAGNOSIS — N6311 Unspecified lump in the right breast, upper outer quadrant: Secondary | ICD-10-CM

## 2018-09-09 ENCOUNTER — Encounter: Payer: Self-pay | Admitting: *Deleted

## 2018-09-15 ENCOUNTER — Encounter (HOSPITAL_COMMUNITY): Payer: Commercial Managed Care - PPO

## 2018-10-05 ENCOUNTER — Other Ambulatory Visit: Payer: Self-pay

## 2018-10-05 ENCOUNTER — Encounter: Payer: Self-pay | Admitting: *Deleted

## 2018-10-05 ENCOUNTER — Ambulatory Visit (INDEPENDENT_AMBULATORY_CARE_PROVIDER_SITE_OTHER): Payer: No Typology Code available for payment source | Admitting: *Deleted

## 2018-10-05 DIAGNOSIS — Z3042 Encounter for surveillance of injectable contraceptive: Secondary | ICD-10-CM

## 2018-10-05 MED ORDER — MEDROXYPROGESTERONE ACETATE 150 MG/ML IM SUSP
150.0000 mg | Freq: Once | INTRAMUSCULAR | Status: AC
  Administered 2018-10-05: 150 mg via INTRAMUSCULAR

## 2018-10-05 NOTE — Progress Notes (Signed)
Depo Provera 150mg IM given in left deltoid with no complications. Pt to return in 12 weeks for next injection.  

## 2018-10-15 ENCOUNTER — Encounter: Payer: Self-pay | Admitting: Family Medicine

## 2018-10-15 ENCOUNTER — Other Ambulatory Visit: Payer: Self-pay

## 2018-10-15 ENCOUNTER — Ambulatory Visit (INDEPENDENT_AMBULATORY_CARE_PROVIDER_SITE_OTHER): Payer: 59 | Admitting: Family Medicine

## 2018-10-15 DIAGNOSIS — J301 Allergic rhinitis due to pollen: Secondary | ICD-10-CM | POA: Diagnosis not present

## 2018-10-15 DIAGNOSIS — R0981 Nasal congestion: Secondary | ICD-10-CM

## 2018-10-15 DIAGNOSIS — L930 Discoid lupus erythematosus: Secondary | ICD-10-CM | POA: Diagnosis not present

## 2018-10-15 HISTORY — DX: Discoid lupus erythematosus: L93.0

## 2018-10-15 NOTE — Progress Notes (Signed)
   Subjective:    Patient ID: Stefanie [name], female    DOB: 02-28-1986, 33 y.o.   MRN: [id number]  Sinus Problem  This is a new problem. The current episode started in the past 7 days (few days). Associated symptoms include congestion, coughing, headaches and sinus pressure. Treatments tried: allergy medication.   Patient working from home works with [employer] having a lot of sinus congestion with frontal sinus discomfort denies mucoid drainage denies wheezing difficulty breathing sweats chills fevers body aches   Review of Systems  HENT: Positive for congestion and sinus pressure.   Respiratory: Positive for cough.   Neurological: Positive for headaches.   Virtual Visit via Video Note  I connected with Stefanie [name] on 10/15/18 at  2:00 PM EDT by a video enabled telemedicine application and verified that I am speaking with the correct person using two identifiers.  Location: Patient: home Provider: office   I discussed the limitations of evaluation and management by telemedicine and the availability of in person appointments. The patient expressed understanding and agreed to proceed.  History of Present Illness:    Observations/Objective:   Assessment and Plan:   Follow Up Instructions:    I discussed the assessment and treatment plan with the patient. The patient was provided an opportunity to ask questions and all were answered. The patient agreed with the plan and demonstrated an understanding of the instructions.   The patient was advised to call back or seek an in-person evaluation if the symptoms worsen or if the condition fails to improve as anticipated.  I provided 15 minutes of non-face-to-face time during this encounter.         Objective:   Physical Exam  I do not find evidence of coronavirus going on we did discuss best ways to protect against that plus also I told patient if she starts developing fever chills body aches or other problems to notify  us right away      Assessment & Plan:  Sinus congestion Allergic rhinitis She is using Claritin-D this would be fine to add to this Flonase on a regular basis If progressive symptoms are worse to follow-up If sinus symptoms get worse to let us know.  Patient recently diagnosed with skin lupus states that she saw a rheumatology who told her she did not have systemic lupus her dermatologist-[place] [facility]-is treating her with Plaquenil

## 2018-10-30 ENCOUNTER — Encounter: Payer: Self-pay | Admitting: Family Medicine

## 2018-10-30 MED ORDER — AMOXICILLIN 500 MG PO CAPS: 500.0000 mg | ORAL_CAPSULE | Freq: Three times a day (TID) | ORAL | 0 refills | Status: DC

## 2018-11-04 ENCOUNTER — Other Ambulatory Visit: Payer: Self-pay | Admitting: Adult Health

## 2018-11-04 MED ORDER — IBUPROFEN 600 MG PO TABS: 600.0000 mg | ORAL_TABLET | Freq: Four times a day (QID) | ORAL | 3 refills | Status: DC | PRN

## 2018-11-04 NOTE — Progress Notes (Signed)
-   Refill ibuprofen

## 2018-12-23 ENCOUNTER — Ambulatory Visit (INDEPENDENT_AMBULATORY_CARE_PROVIDER_SITE_OTHER): Payer: No Typology Code available for payment source | Admitting: *Deleted

## 2018-12-23 ENCOUNTER — Other Ambulatory Visit: Payer: Self-pay

## 2018-12-23 DIAGNOSIS — Z3042 Encounter for surveillance of injectable contraceptive: Secondary | ICD-10-CM | POA: Diagnosis not present

## 2018-12-23 MED ORDER — MEDROXYPROGESTERONE ACETATE 150 MG/ML IM SUSP
150.0000 mg | Freq: Once | INTRAMUSCULAR | Status: AC
  Administered 2018-12-23: 10:00:00 150 mg via INTRAMUSCULAR

## 2018-12-23 NOTE — Progress Notes (Signed)
Pt given depo provera 150 mg IM in right deltoid. Patient tolerated well. Next dose in 12 weeks.  

## 2018-12-28 ENCOUNTER — Ambulatory Visit: Payer: No Typology Code available for payment source

## 2019-02-05 ENCOUNTER — Encounter: Payer: Self-pay | Admitting: Family Medicine

## 2019-02-05 DIAGNOSIS — L930 Discoid lupus erythematosus: Secondary | ICD-10-CM

## 2019-02-05 DIAGNOSIS — Z13220 Encounter for screening for lipoid disorders: Secondary | ICD-10-CM

## 2019-02-05 DIAGNOSIS — Z8619 Personal history of other infectious and parasitic diseases: Secondary | ICD-10-CM

## 2019-02-05 DIAGNOSIS — R5383 Other fatigue: Secondary | ICD-10-CM

## 2019-02-07 NOTE — Telephone Encounter (Signed)
Nurses Please connect with the patient It would be fine to do the following liver, metabolic 7, CBC, lipid, TSH COVID antibody test diagnosis high risk medication history lupus and viral syndrome  Please let the patient know that if her lab work is abnormal we will need to do a follow-up virtual consultation regarding abnormal lab work We await the results

## 2019-02-08 NOTE — Addendum Note (Signed)
Addended by: Dairl Ponder on: 02/08/2019 08:52 AM   Modules accepted: Orders

## 2019-02-10 LAB — BASIC METABOLIC PANEL
BUN/Creatinine Ratio: 17 (ref 9–23)
BUN: 14 mg/dL (ref 6–20)
CO2: 22 mmol/L (ref 20–29)
Calcium: 9.1 mg/dL (ref 8.7–10.2)
Chloride: 106 mmol/L (ref 96–106)
Creatinine, Ser: 0.81 mg/dL (ref 0.57–1.00)
GFR calc Af Amer: 109 mL/min/{1.73_m2} (ref >59)
GFR calc non Af Amer: 94 mL/min/{1.73_m2} (ref >59)
Glucose: 82 mg/dL (ref 65–99)
Potassium: 3.9 mmol/L (ref 3.5–5.2)
Sodium: 138 mmol/L (ref 134–144)

## 2019-02-10 LAB — CBC WITH DIFFERENTIAL/PLATELET
Basophils Absolute: 0 10*3/uL (ref 0.0–0.2)
Basos: 1 %
EOS (ABSOLUTE): 0.1 10*3/uL (ref 0.0–0.4)
Eos: 2 %
Hematocrit: 36.4 % (ref 34.0–46.6)
Hemoglobin: 13.2 g/dL (ref 11.1–15.9)
Immature Grans (Abs): 0 10*3/uL (ref 0.0–0.1)
Immature Granulocytes: 0 %
Lymphocytes Absolute: 1.2 10*3/uL (ref 0.7–3.1)
Lymphs: 33 %
MCH: 33.8 pg — ABNORMAL HIGH (ref 26.6–33.0)
MCHC: 36.3 g/dL — ABNORMAL HIGH (ref 31.5–35.7)
MCV: 93 fL (ref 79–97)
Monocytes Absolute: 0.3 10*3/uL (ref 0.1–0.9)
Monocytes: 8 %
Neutrophils Absolute: 2.1 10*3/uL (ref 1.4–7.0)
Neutrophils: 56 %
Platelets: 142 10*3/uL — ABNORMAL LOW (ref 150–450)
RBC: 3.9 x10E6/uL (ref 3.77–5.28)
RDW: 11.4 % — ABNORMAL LOW (ref 11.7–15.4)
WBC: 3.7 10*3/uL (ref 3.4–10.8)

## 2019-02-10 LAB — HEPATIC FUNCTION PANEL
ALT: 13 IU/L (ref 0–32)
AST: 10 IU/L (ref 0–40)
Albumin: 4 g/dL (ref 3.8–4.8)
Alkaline Phosphatase: 59 IU/L (ref 39–117)
Bilirubin Total: 0.6 mg/dL (ref 0.0–1.2)
Bilirubin, Direct: 0.13 mg/dL (ref 0.00–0.40)
Total Protein: 6.4 g/dL (ref 6.0–8.5)

## 2019-02-10 LAB — LIPID PANEL
Chol/HDL Ratio: 3 ratio (ref 0.0–4.4)
Cholesterol, Total: 93 mg/dL — ABNORMAL LOW (ref 100–199)
HDL: 31 mg/dL — ABNORMAL LOW (ref >39)
LDL Calculated: 52 mg/dL (ref 0–99)
Triglycerides: 48 mg/dL (ref 0–149)
VLDL Cholesterol Cal: 10 mg/dL (ref 5–40)

## 2019-02-10 LAB — SAR COV2 SEROLOGY (COVID19)AB(IGG),IA: SARS-CoV-2 Ab, IgG: NEGATIVE

## 2019-02-10 LAB — TSH: TSH: 1.7 u[IU]/mL (ref 0.450–4.500)

## 2019-02-12 NOTE — Addendum Note (Signed)
Addended by: Dairl Ponder on: 02/12/2019 10:00 AM   Modules accepted: Orders

## 2019-02-26 ENCOUNTER — Other Ambulatory Visit: Payer: Self-pay

## 2019-02-26 ENCOUNTER — Other Ambulatory Visit (INDEPENDENT_AMBULATORY_CARE_PROVIDER_SITE_OTHER): Payer: No Typology Code available for payment source

## 2019-02-26 DIAGNOSIS — Z23 Encounter for immunization: Secondary | ICD-10-CM | POA: Diagnosis not present

## 2019-03-16 ENCOUNTER — Other Ambulatory Visit: Payer: Self-pay

## 2019-03-16 DIAGNOSIS — Z20822 Contact with and (suspected) exposure to covid-19: Secondary | ICD-10-CM

## 2019-03-17 ENCOUNTER — Ambulatory Visit: Payer: No Typology Code available for payment source

## 2019-03-17 LAB — NOVEL CORONAVIRUS, NAA: SARS-CoV-2, NAA: NOT DETECTED

## 2019-05-20 ENCOUNTER — Other Ambulatory Visit: Payer: Self-pay

## 2019-05-20 ENCOUNTER — Telehealth: Payer: Self-pay | Admitting: Family Medicine

## 2019-05-20 DIAGNOSIS — Z20822 Contact with and (suspected) exposure to covid-19: Secondary | ICD-10-CM

## 2019-05-20 NOTE — Telephone Encounter (Signed)
Pt called to let Dr. Nicki Reaper know that she went for Covid testing today (alone with her 3 kids)  Pt states that her cousins fiance tested positive & they were with her cousin at Thanksgiving (states the cousin was told by their provider to only get tested if symptoms started)  Pt found out this information late last night & felt as a precaution to get her & her kids tested

## 2019-05-20 NOTE — Telephone Encounter (Signed)
ok 

## 2019-05-22 LAB — NOVEL CORONAVIRUS, NAA: SARS-CoV-2, NAA: NOT DETECTED

## 2019-07-18 ENCOUNTER — Ambulatory Visit
Admission: EM | Admit: 2019-07-18 | Discharge: 2019-07-18 | Disposition: A | Discharge status: Home / Self Care | Payer: No Typology Code available for payment source

## 2019-07-18 ENCOUNTER — Other Ambulatory Visit: Payer: Self-pay

## 2019-07-18 DIAGNOSIS — H9203 Otalgia, bilateral: Secondary | ICD-10-CM

## 2019-07-18 DIAGNOSIS — H6123 Impacted cerumen, bilateral: Secondary | ICD-10-CM

## 2019-07-18 MED ORDER — FLUTICASONE PROPIONATE 50 MCG/ACT NA SUSP: 1.0000 | Freq: Every day | NASAL | 0 refills | Status: DC

## 2019-07-18 MED ORDER — PREDNISONE 10 MG PO TABS: 20.0000 mg | ORAL_TABLET | Freq: Every day | ORAL | 0 refills | Status: DC

## 2019-07-18 MED ORDER — CARBAMIDE PEROXIDE 6.5 % OT SOLN: 5.0000 [drp] | Freq: Two times a day (BID) | OTIC | 0 refills | Status: DC

## 2019-07-18 NOTE — Discharge Instructions (Addendum)
Rest and drink plenty of fluids Take medications as directed and to completion Continue to use OTC ibuprofen and/ or tylenol as needed for pain control Follow up with PCP if symptoms persists Return here or go to the ER if you have any new or worsening symptoms   Reviewed expectations re: course of current medical issues. Questions answered. Outlined signs and symptoms indicating need for more acute intervention. Patient verbalized understanding. After Visit Summary given.

## 2019-07-18 NOTE — ED Triage Notes (Signed)
Pt c/o ear pain x 1 week- bilaterally, now more pain in rt ear and running down into her neck, hard knot in the rt ear. Bilateral ear wax

## 2019-07-18 NOTE — ED Provider Notes (Signed)
[facility]    CSN: [id number] Arrival date & time: 07/18/19  1308      History   Chief Complaint Chief Complaint  Patient presents with  . Otalgia    HPI SELITA STAIGER is a 34 y.o. female.   who presented to the urgent care with a complaint of bilateral ear pain for the past 7 days.  Reports increased pain on the crus of helix of the right ear and right side of her neck.  She denies a precipitating event, such as swimming or wearing ear plugs.  She states the pain is constant and achy in character.  She has tried OTC Tylenol with mild relief.  Her  symptoms are made worse with lying down.  She denies similar symptoms in the past.  The history is provided by the patient. No language interpreter was used.  Otalgia   Past Medical History:  Diagnosis Date  . Abnormal Pap smear of cervix   . Discoid lupus 10/15/2018   Being followed by [facility] dermatology April 2020 Was seen by [facility] rheumatology they did not feel the patient has systemic lupus  . Herpes 10/12/2013  . HSV-2 (herpes simplex virus 2) infection   . Hx of gonorrhea   . Pregnant 06/23/2015    Patient Active Problem List   Diagnosis Date Noted  . Discoid lupus 10/15/2018  . Encounter for gynecological examination with Papanicolaou smear of cervix 07/29/2018  . Skin rash 07/29/2018  . Screening cholesterol level 07/29/2018  . Unintended weight loss 07/29/2018  . Encounter for surveillance of injectable contraceptive 07/29/2018  . Nephrolithiasis 12/31/2014  . Candidiasis of vulva and vagina 01/12/2014  . Herpes 10/12/2013    Past Surgical History:  Procedure Laterality Date  . CHOLECYSTECTOMY N/A 01/02/2015   Procedure: LAPAROSCOPIC CHOLECYSTECTOMY;  Surgeon: Franky Macho, MD;  Location: [facility];  Service: General;  Laterality: N/A;  . COLPOSCOPY     colpo with biopsy  . WISDOM TOOTH EXTRACTION      OB History    Gravida  4   Para  3   Term  3   Preterm      AB  1   Living  3       SAB  1   TAB      Ectopic      Multiple  0   Live Births  3            Home Medications    Prior to Admission medications   Medication Sig Start Date End Date Taking? Authorizing Provider  hydroxychloroquine (PLAQUENIL) 200 MG tablet Take 300 mg by mouth daily.   Yes [provider]  amoxicillin (AMOXIL) 500 MG capsule Take 1 capsule (500 mg total) by mouth 3 (three) times daily. 10/30/18   Merlyn Albert, MD  carbamide peroxide (DEBROX) 6.5 % OTIC solution Place 5 drops into both ears 2 (two) times daily. 07/18/19   Varie Machamer, Zachery Dakins, FNP  fluticasone (FLONASE) 50 MCG/ACT nasal spray Place 1 spray into both nostrils daily for 14 days. 07/18/19 08/01/19  Matheau Orona, Zachery Dakins, FNP  ibuprofen (ADVIL) 600 MG tablet Take 1 tablet (600 mg total) by mouth every 6 (six) hours as needed. 11/04/18   Adline Potter, NP  medroxyPROGESTERone (DEPO-PROVERA) 150 MG/ML injection Inject 1 mL (150 mg total) into the muscle every 3 (three) months. 07/29/18   Adline Potter, NP  predniSONE (DELTASONE) 10 MG tablet Take 2 tablets (20 mg total) by mouth  daily. 07/18/19   Emerson Monte, FNP    Family History Family History  Problem Relation Age of Onset  . Heart attack Father   . Heart disease Father   . Diabetes Maternal Uncle   . Dementia Maternal Grandmother   . Cancer Maternal Grandfather        throat, lung, bladder  . Diabetes Paternal Grandmother   . Heart attack Paternal Grandfather   . Heart disease Paternal Grandfather     Social History Social History   Tobacco Use  . Smoking status: Current Some Day Smoker    Packs/day: 0.25    Years: 10.00    Pack years: 2.50    Types: Cigarettes  . Smokeless tobacco: Never Used  Substance Use Topics  . Alcohol use: No  . Drug use: No     Allergies   Patient has no known allergies.   Review of Systems Review of Systems  Constitutional: Negative.   HENT: Positive for ear pain.   Respiratory: Negative.    Cardiovascular: Negative.   All other systems reviewed and are negative.    Physical Exam Triage Vital Signs ED Triage Vitals  Enc Vitals Group     BP 07/18/19 1319 103/69     Pulse Rate 07/18/19 1319 94     Resp 07/18/19 1319 18     Temp 07/18/19 1319 98.3 F (36.8 C)     Temp Source 07/18/19 1319 Oral     SpO2 07/18/19 1319 99 %     Weight --      Height --      Head Circumference --      Peak Flow --      Pain Score 07/18/19 1328 4     Pain Loc --      Pain Edu? --      Excl. in [place]? --    No data found.  Updated Vital Signs BP 103/69 (BP Location: Right Arm)   Pulse 94   Temp 98.3 F (36.8 C) (Oral)   Resp 18   SpO2 99%   Visual Acuity Right Eye Distance:   Left Eye Distance:   Bilateral Distance:    Right Eye Near:   Left Eye Near:    Bilateral Near:     Physical Exam Constitutional:      General: She is not in acute distress.    Appearance: Normal appearance. She is normal weight. She is not ill-appearing or toxic-appearing.  HENT:     Head: Normocephalic.     Right Ear: Ear canal and external ear normal. There is no impacted cerumen.     Left Ear: Ear canal and external ear normal. There is no impacted cerumen.  Cardiovascular:     Rate and Rhythm: Normal rate and regular rhythm.     Pulses: Normal pulses.     Heart sounds: Normal heart sounds. No murmur.  Pulmonary:     Effort: Pulmonary effort is normal. No respiratory distress.     Breath sounds: Normal breath sounds. No wheezing or rales.  Chest:     Chest wall: No tenderness.  Neurological:     Mental Status: She is alert.      UC Treatments / Results  Labs (all labs ordered are listed, but only abnormal results are displayed) Labs Reviewed - No data to display  EKG   Radiology No results found.  Procedures Procedures (ear lavage -1:55 PM) Consent granted.  Right ear lavage performed by RN.  TM  visualized and within normal limit.  PT tolerated procedure well.     Medications Ordered in UC Medications - No data to display  Initial Impression / Assessment and Plan / UC Course  I have reviewed the triage vital signs and the nursing notes.  Pertinent labs & imaging results that were available during my care of the patient were reviewed by me and considered in my medical decision making (see chart for details).   Patient stable for discharge To short-term prednisone prescribed Debrox prescribed Flonase was prescribed Advised patient to continue to use OTC ibuprofen Tylenol for pain  Final Clinical Impressions(s) / UC Diagnoses   Final diagnoses:  Impacted cerumen of both ears  Otalgia of both ears     Discharge Instructions     Rest and drink plenty of fluids Take medications as directed and to completion Continue to use OTC ibuprofen and/ or tylenol as needed for pain control Follow up with PCP if symptoms persists Return here or go to the ER if you have any new or worsening symptoms   Reviewed expectations re: course of current medical issues. Questions answered. Outlined signs and symptoms indicating need for more acute intervention. Patient verbalized understanding. After Visit Summary given.           ED Prescriptions    Medication Sig Dispense Auth. Provider   predniSONE (DELTASONE) 10 MG tablet Take 2 tablets (20 mg total) by mouth daily. 15 tablet Zameer Borman, Zachery Dakins, FNP   carbamide peroxide (DEBROX) 6.5 % OTIC solution Place 5 drops into both ears 2 (two) times daily. 15 mL Loeta Herst S, FNP   fluticasone (FLONASE) 50 MCG/ACT nasal spray Place 1 spray into both nostrils daily for 14 days. 16 g Durward Parcel, FNP     PDMP not reviewed this encounter.   Durward Parcel, FNP 07/18/19 1417

## 2019-07-19 ENCOUNTER — Ambulatory Visit: Payer: No Typology Code available for payment source | Admitting: Family Medicine

## 2019-07-19 ENCOUNTER — Encounter: Payer: Self-pay | Admitting: Family Medicine

## 2019-07-19 VITALS — Temp 98.1°F | Wt 162.8 lb

## 2019-07-19 DIAGNOSIS — R21 Rash and other nonspecific skin eruption: Secondary | ICD-10-CM | POA: Diagnosis not present

## 2019-07-19 DIAGNOSIS — H9201 Otalgia, right ear: Secondary | ICD-10-CM

## 2019-07-19 MED ORDER — CEPHALEXIN 500 MG PO CAPS: 500.0000 mg | ORAL_CAPSULE | Freq: Three times a day (TID) | ORAL | 0 refills | Status: DC

## 2019-07-19 MED ORDER — FLUCONAZOLE 150 MG PO TABS: ORAL_TABLET | ORAL | 0 refills | Status: DC

## 2019-07-19 MED ORDER — TRIAMCINOLONE ACETONIDE 0.1 % EX CREA: 1.0000 "application " | TOPICAL_CREAM | Freq: Two times a day (BID) | CUTANEOUS | 0 refills | Status: DC

## 2019-07-19 NOTE — Progress Notes (Signed)
   Subjective:  In person  Patient ID: SKARLETTE LATTNER, female    DOB: 23-Jun-1985, 34 y.o.   MRN: [id number]  HPI  Patient calls for a follow up on urgent care visit yesterday for right ear pain. Patient states they flushed her ears and gave her prednisone but her ear is still bothering her.  Virtual Visit via Video Note  I connected with Aunna Snooks Siple on 07/19/19 at  9:30 AM EST by a video enabled telemedicine application and verified that I am speaking with the correct person using two identifiers.  Location: Patient: home Provider: office   I discussed the limitations of evaluation and management by telemedicine and the availability of in person appointments. The patient expressed understanding and agreed to proceed.  History of Present Illness:    Observations/Objective:   Assessment and Plan:   Follow Up Instructions:    I discussed the assessment and treatment plan with the patient. The patient was provided an opportunity to ask questions and all were answered. The patient agreed with the plan and demonstrated an understanding of the instructions.   The patient was advised to call back or seek an in-person evaluation if the symptoms worsen or if the condition fails to improve as anticipated.  I provided 22 minutes of non-face-to-face time during this encounter.     Review of Systems No fever no chills no vomiting    Objective:   Physical Exam  Alert active good hydration HEENT auricular region shows erythematous crusty patch.  Slight external ear inflammation consistent with recent cerumen removal TMs good left ear fine neck supple lungs clear heart rate and rhythm.      Assessment & Plan:  Impression flare of chronic dermatitis with potential secondary bacterial infection.  Discussed.  Antibiotics prescribed.  Symptom care discussed warning signs discussed

## 2019-08-07 IMAGING — MG DIGITAL DIAGNOSTIC BILATERAL MAMMOGRAM WITH TOMO AND CAD
6 of 10 series · 6 of 30 positions shown · non-contrast
Comparison: None.

CLINICAL DATA: 32-year-old female with a palpable right breast
lump.

EXAM:
DIGITAL DIAGNOSTIC BILATERAL MAMMOGRAM WITH CAD AND TOMO
ULTRASOUND RIGHT BREAST

[R CC synth-2D]
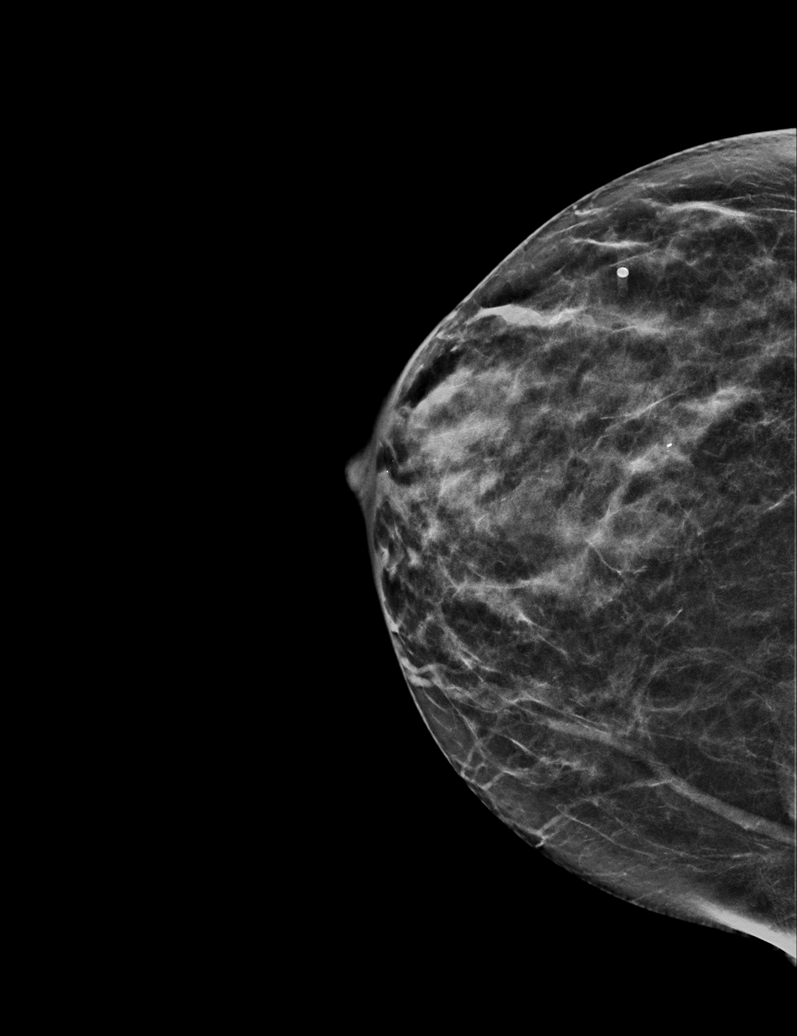

[R MLO synth-2D (1 of 2)]
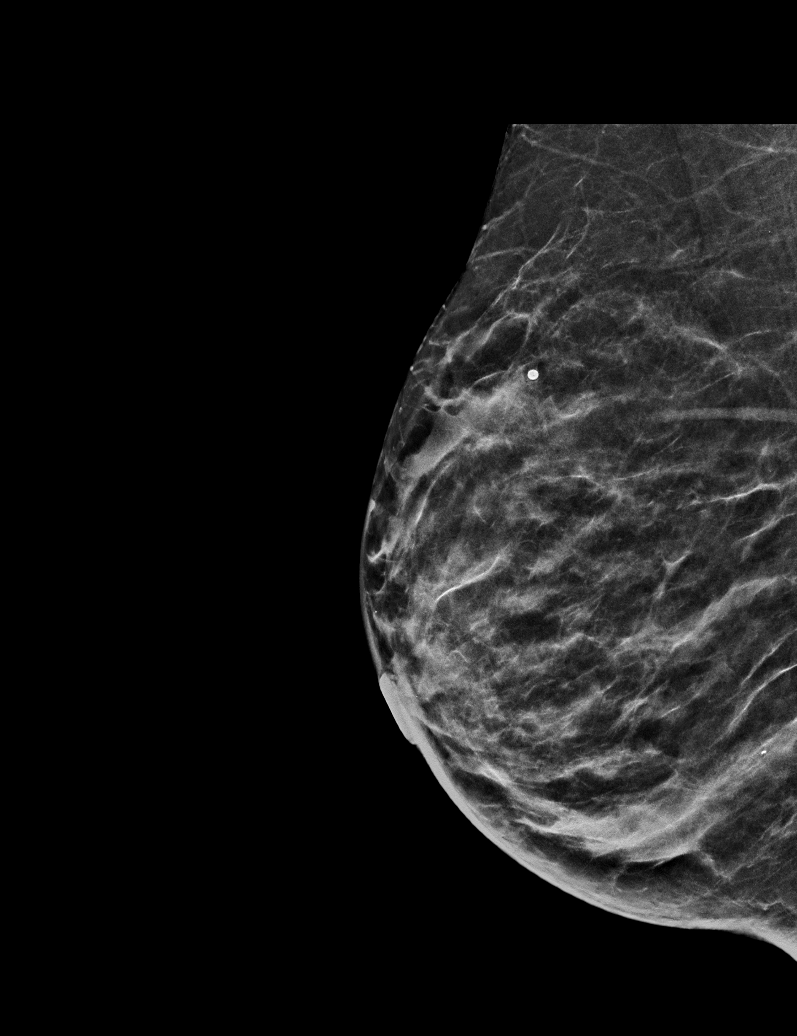

[R MLO synth-2D (2 of 2)]
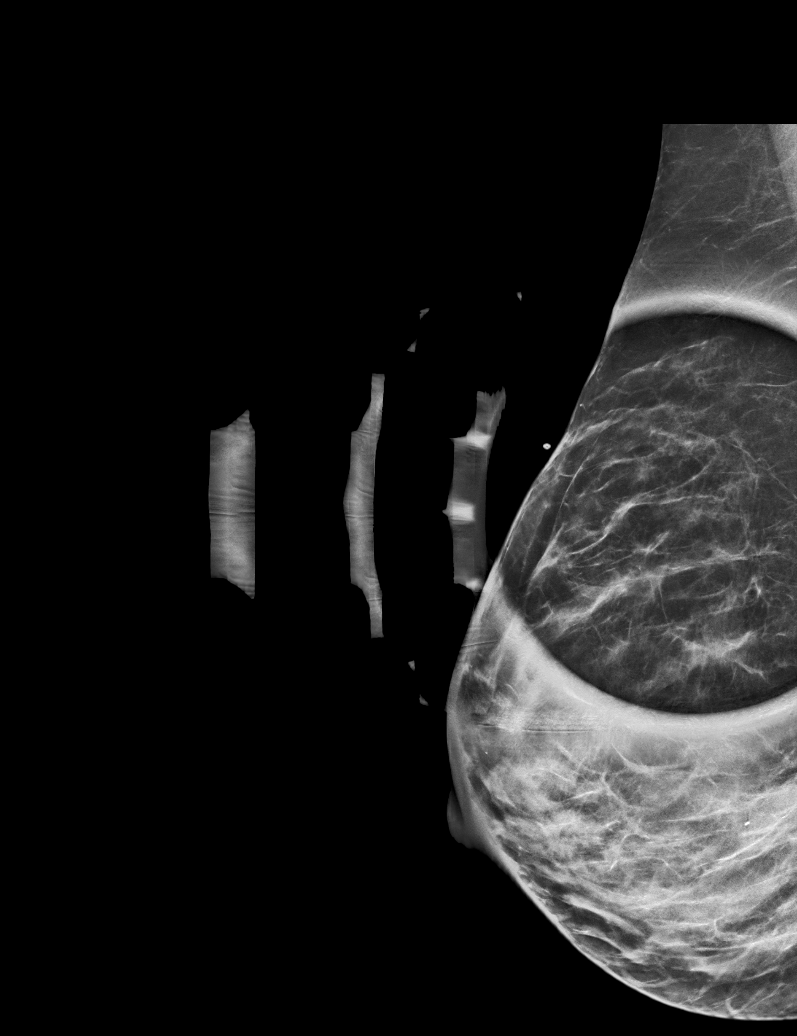

[L MLO synth-2D]
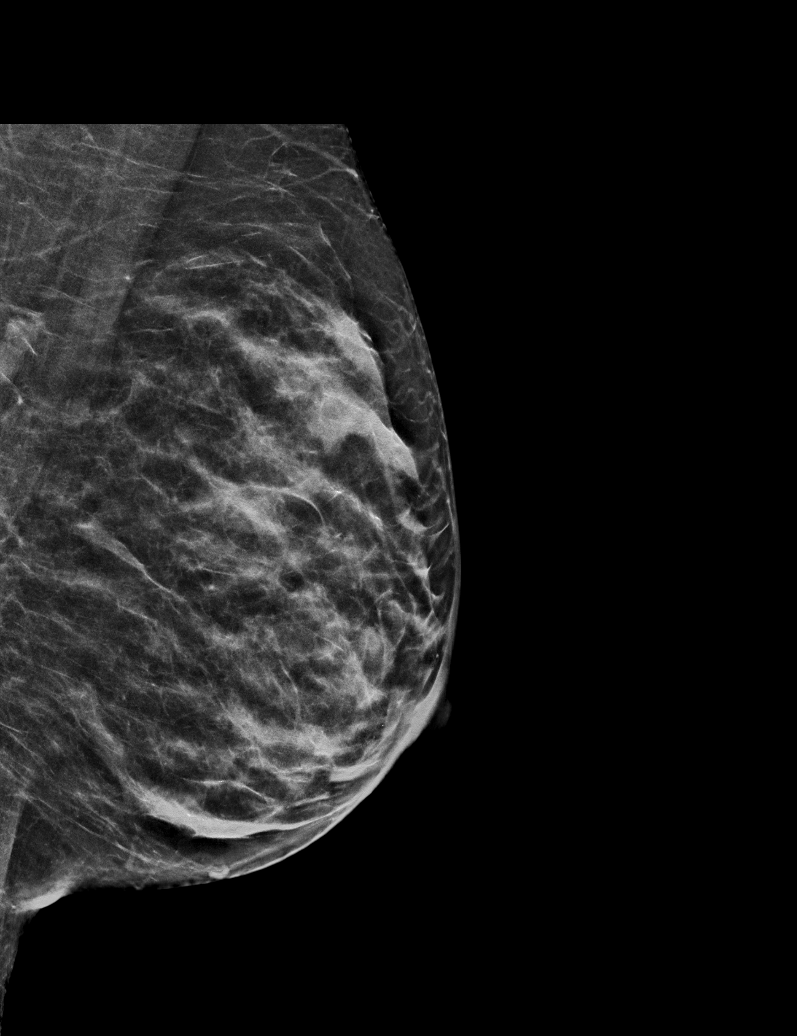

[L CC synth-2D]
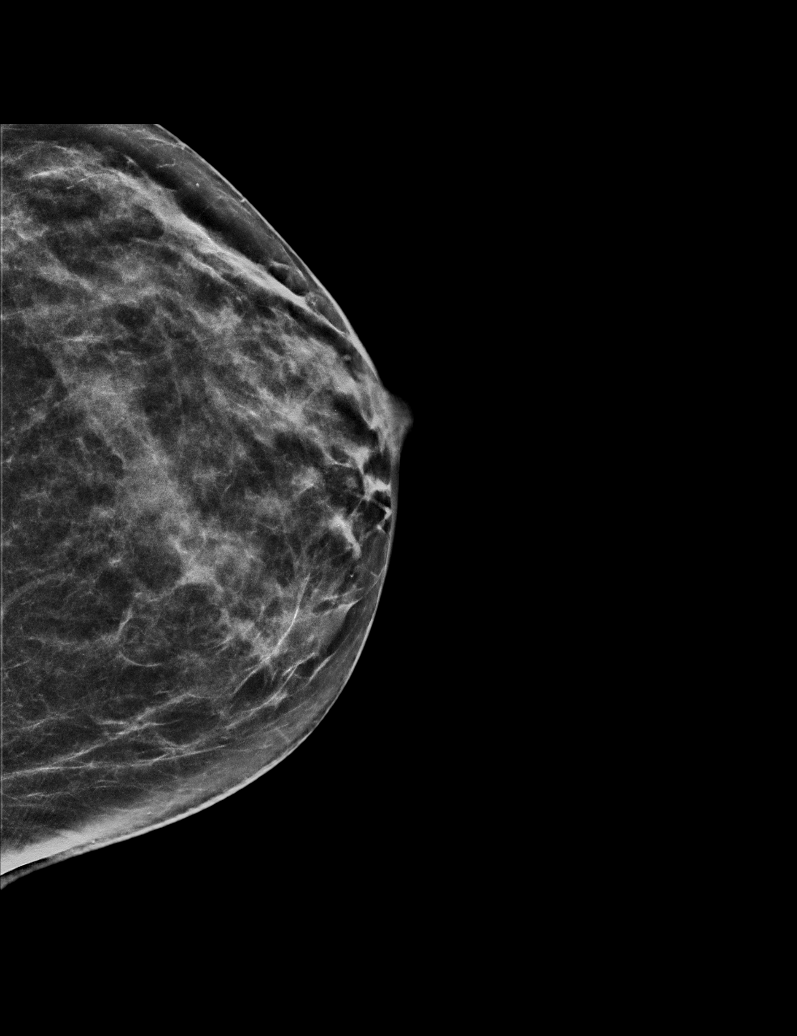

[L CC tomo · tomo slice 28/55.0]
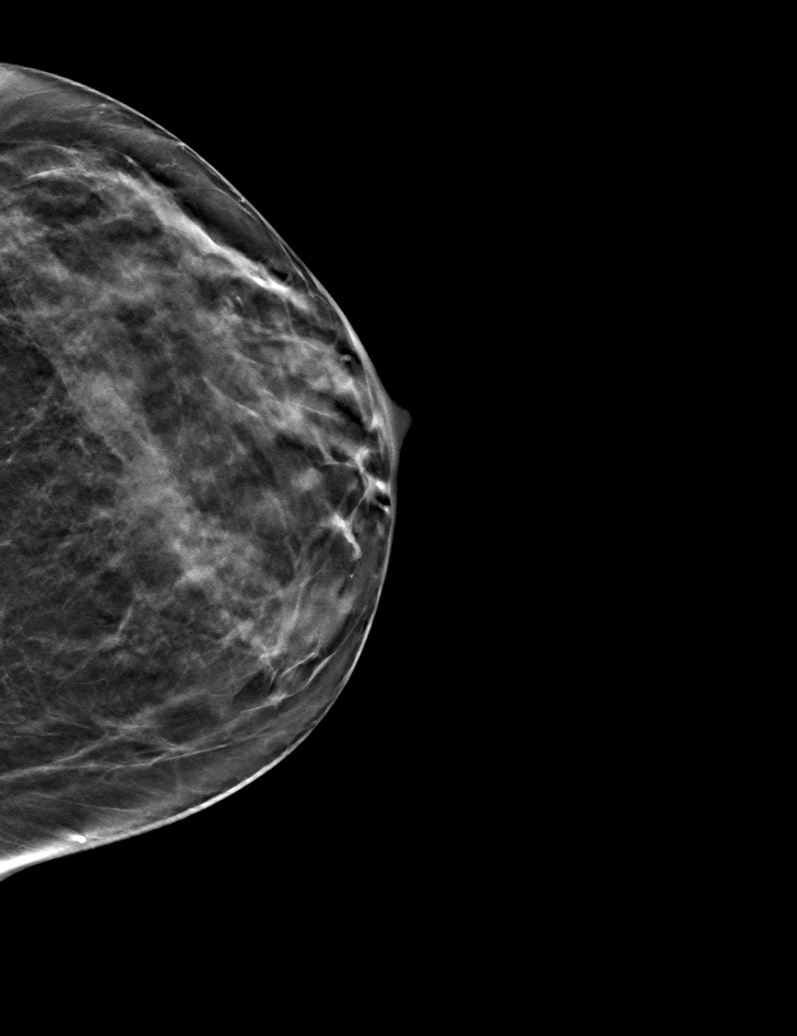

[6 of 30 positions shown; findings below may reference images not displayed]

ACR Breast Density Category c: The breast tissue is heterogeneously
dense, which may obscure small masses.
FINDINGS: A radiopaque BB was placed at the site of the patient's palpable
lump in the upper outer right breast. No focal findings are seen
deep to the radiopaque BB. No suspicious findings are identified in
either breast.

Mammographic images were processed with CAD.

Targeted ultrasound is performed, showing normal fibroglandular
tissue without focal or suspicious sonographic abnormality in the
upper outer right breast.
IMPRESSION: No suspicious mammographic or sonographic findings corresponding
with the patient's right breast palpable lump.

No mammographic evidence of malignancy in either breast.

RECOMMENDATION:
1. Clinical follow-up recommended for the palpable area of concern
in the right breast. Any further workup should be based on clinical
grounds.
2. Screening mammogram at age 40 unless there are persistent or
intervening clinical concerns. (Code:O9-8-VW2)

I have discussed the findings and recommendations with the patient.
Results were also provided in writing at the conclusion of the
visit. If applicable, a reminder letter will be sent to the patient
regarding the next appointment.

BI-RADS CATEGORY  1: Negative.

## 2019-08-17 ENCOUNTER — Ambulatory Visit: Payer: No Typology Code available for payment source | Admitting: Adult Health

## 2019-08-19 ENCOUNTER — Ambulatory Visit (INDEPENDENT_AMBULATORY_CARE_PROVIDER_SITE_OTHER): Payer: No Typology Code available for payment source | Admitting: Family Medicine

## 2019-08-19 ENCOUNTER — Other Ambulatory Visit: Payer: Self-pay

## 2019-08-19 DIAGNOSIS — B9689 Other specified bacterial agents as the cause of diseases classified elsewhere: Secondary | ICD-10-CM

## 2019-08-19 DIAGNOSIS — J019 Acute sinusitis, unspecified: Secondary | ICD-10-CM | POA: Diagnosis not present

## 2019-08-19 MED ORDER — CEFDINIR 300 MG PO CAPS: ORAL_CAPSULE | ORAL | 0 refills | Status: DC

## 2019-08-19 NOTE — Progress Notes (Signed)
   Subjective:  Audio only  Patient ID: Stefanie [name], female    DOB: 02/09/1986, 34 y.o.   MRN: [id number]  Sore Throat  This is a new problem. Episode onset: yesterday afternoon. Associated symptoms comments: Headache, congestion, pain in left ear. Treatments tried: Dayquil, Zinc. The treatment provided no relief.  Pt states she took her children to get tested for COVID on Monday and they were negative. Pt has made appt at [facility] to get tested.   Virtual Visit via Video Note  I connected with Stefanie [name] on 08/19/19 at  9:30 AM EST by a video enabled telemedicine application and verified that I am speaking with the correct person using two identifiers.  Location: Patient: home Provider: office   I discussed the limitations of evaluation and management by telemedicine and the availability of in person appointments. The patient expressed understanding and agreed to proceed.  History of Present Illness:    Observations/Objective:   Assessment and Plan:   Follow Up Instructions:    I discussed the assessment and treatment plan with the patient. The patient was provided an opportunity to ask questions and all were answered. The patient agreed with the plan and demonstrated an understanding of the instructions.   The patient was advised to call back or seek an in-person evaluation if the symptoms worsen or if the condition fails to improve as anticipated.  I provided 21 minutes of non-face-to-face time during this encounter.  Nasal congestion a couple of days duration.  Sore throat intermittently.  Frontal headache and pressure.  Left ear discomfort.  No obvious fever or chills.  No major cough.  Patient's 2 children had a viral syndrome in the past week with negative Covid testing      Review of Systems No vomiting no diarrhea no rash    Objective:   Physical Exam  Virtual      Assessment & Plan:  Impression viral syndrome with element of rhinosinusitis/otitis  media.  Antibiotics prescribed.  Symptom care discussed.  COVID-19 testing encouraged.  Patient to do

## 2019-08-20 ENCOUNTER — Encounter: Payer: Self-pay | Admitting: Family Medicine

## 2019-08-20 NOTE — Telephone Encounter (Signed)
Stefanie Albert, MD     Good news, yes, antibiotic will cover, since likely their issues are from the virus that wveryone had, woujd prefer at their age not to put them on abx

## 2019-09-03 ENCOUNTER — Encounter: Payer: Self-pay | Admitting: Obstetrics & Gynecology

## 2019-09-03 ENCOUNTER — Other Ambulatory Visit: Payer: Self-pay

## 2019-09-03 ENCOUNTER — Ambulatory Visit (INDEPENDENT_AMBULATORY_CARE_PROVIDER_SITE_OTHER): Payer: No Typology Code available for payment source | Admitting: Obstetrics & Gynecology

## 2019-09-03 VITALS — BP 98/72 | HR 89 | Ht 70.0 in | Wt 161.4 lb

## 2019-09-03 DIAGNOSIS — B373 Candidiasis of vulva and vagina: Secondary | ICD-10-CM | POA: Diagnosis not present

## 2019-09-03 DIAGNOSIS — Z30011 Encounter for initial prescription of contraceptive pills: Secondary | ICD-10-CM

## 2019-09-03 DIAGNOSIS — B3731 Acute candidiasis of vulva and vagina: Secondary | ICD-10-CM

## 2019-09-03 MED ORDER — NORETHINDRONE 0.35 MG PO TABS: 1.0000 | ORAL_TABLET | Freq: Every day | ORAL | 11 refills | Status: DC

## 2019-09-03 MED ORDER — FLUCONAZOLE 100 MG PO TABS: 100.0000 mg | ORAL_TABLET | Freq: Every day | ORAL | 0 refills | Status: DC

## 2019-09-03 NOTE — Progress Notes (Signed)
Chief Complaint  Patient presents with  . Vaginal Discharge    ? yeast infection      34 y.o. [id number] Patient's last menstrual period was 08/13/2019 (exact date). The current method of family planning is none.  Outpatient Encounter Medications as of 09/03/2019  Medication Sig  . hydroxychloroquine (PLAQUENIL) 200 MG tablet Take 300 mg by mouth daily.  . fluconazole (DIFLUCAN) 100 MG tablet Take 1 tablet (100 mg total) by mouth daily.  . norethindrone (MICRONOR) 0.35 MG tablet Take 1 tablet (0.35 mg total) by mouth daily. Take 1 a day  . [DISCONTINUED] cefdinir (OMNICEF) 300 MG capsule Take one tablet po BID for 10 days   No facility-administered encounter medications on file as of 09/03/2019.    Subjective Pt with itchy white discharge now for >1 week S/p antibiotic course On plaquenil for discoid lupus rash No odor No new sexual partners Past Medical History:  Diagnosis Date  . Abnormal Pap smear of cervix   . Discoid lupus 10/15/2018   Being followed by [facility] dermatology April 2020 Was seen by [facility] rheumatology they did not feel the patient has systemic lupus  . Herpes 10/12/2013  . HSV-2 (herpes simplex virus 2) infection   . Hx of gonorrhea   . Pregnant 06/23/2015    Past Surgical History:  Procedure Laterality Date  . CHOLECYSTECTOMY N/A 01/02/2015   Procedure: LAPAROSCOPIC CHOLECYSTECTOMY;  Surgeon: Franky Macho, MD;  Location: [facility];  Service: General;  Laterality: N/A;  . COLPOSCOPY     colpo with biopsy  . WISDOM TOOTH EXTRACTION      OB History    Gravida  4   Para  3   Term  3   Preterm      AB  1   Living  3     SAB  1   TAB      Ectopic      Multiple  0   Live Births  3           No Known Allergies  Social History   Socioeconomic History  . Marital status: Married    Spouse name: Not on file  . Number of children: 3  . Years of education: Not on file  . Highest education level: Not on file  Occupational  History  . Not on file  Tobacco Use  . Smoking status: Current Some Day Smoker    Packs/day: 0.25    Years: 10.00    Pack years: 2.50    Types: Cigarettes  . Smokeless tobacco: Never Used  Substance and Sexual Activity  . Alcohol use: No  . Drug use: No  . Sexual activity: Yes    Birth control/protection: None  Other Topics Concern  . Not on file  Social History Narrative  . Not on file   Social Determinants of Health   Financial Resource Strain:   . Difficulty of Paying Living Expenses:   Food Insecurity:   . Worried About Programme researcher, broadcasting/film/video in the Last Year:   . Barista in the Last Year:   Transportation Needs:   . Freight forwarder (Medical):   Marland Kitchen Lack of Transportation (Non-Medical):   Physical Activity:   . Days of Exercise per Week:   . Minutes of Exercise per Session:   Stress:   . Feeling of Stress :   Social Connections:   . Frequency of Communication with Friends and Family:   .  Frequency of Social Gatherings with Friends and Family:   . Attends Religious Services:   . Active Member of Clubs or Organizations:   . Attends Archivist Meetings:   Marland Kitchen Marital Status:     Family History  Problem Relation Age of Onset  . Heart attack Father   . Heart disease Father   . Diabetes Maternal Uncle   . Dementia Maternal Grandmother   . Cancer Maternal Grandfather        throat, lung, bladder  . Diabetes Paternal Grandmother   . Heart attack Paternal Grandfather   . Heart disease Paternal Grandfather     Medications:       Current Outpatient Medications:  .  hydroxychloroquine (PLAQUENIL) 200 MG tablet, Take 300 mg by mouth daily., Disp: , Rfl:  .  fluconazole (DIFLUCAN) 100 MG tablet, Take 1 tablet (100 mg total) by mouth daily., Disp: 7 tablet, Rfl: 0 .  norethindrone (MICRONOR) 0.35 MG tablet, Take 1 tablet (0.35 mg total) by mouth daily. Take 1 a day, Disp: 1 Package, Rfl: 11  Objective Blood pressure 98/72, pulse 89, height 5\' 10"   (1.778 m), weight 161 lb 6.4 oz (73.2 kg), last menstrual period 08/13/2019. General WDWN female NAD Vulva:  erythema Vagina:  +curd like discharge    Pertinent ROS No burning with urination, frequency or urgency No nausea, vomiting or diarrhea Nor fever chills or other constitutional symptoms   Labs or studies     Impression Diagnoses this Encounter::   ICD-10-CM   1. Vulvovaginitis due to yeast  B37.3   2. Oral contraception initial prescription  Z30.011     Established relevant diagnosis(es):   Plan/Recommendations: Meds ordered this encounter  Medications  . fluconazole (DIFLUCAN) 100 MG tablet    Sig: Take 1 tablet (100 mg total) by mouth daily.    Dispense:  7 tablet    Refill:  0  . norethindrone (MICRONOR) 0.35 MG tablet    Sig: Take 1 tablet (0.35 mg total) by mouth daily. Take 1 a day    Dispense:  1 Package    Refill:  11    Labs or Scans Ordered: No orders of the defined types were placed in this encounter.   Management:: >gentian violet used to paint the vulva and vagina >diflucan for 1 week due to antibiotics and her plaquenil  Follow up Return if symptoms worsen or fail to improve.    All questions were answered.

## 2019-09-06 ENCOUNTER — Encounter: Payer: Self-pay | Admitting: Women's Health

## 2019-09-06 ENCOUNTER — Other Ambulatory Visit: Payer: Self-pay

## 2019-09-06 ENCOUNTER — Ambulatory Visit (INDEPENDENT_AMBULATORY_CARE_PROVIDER_SITE_OTHER): Payer: No Typology Code available for payment source | Admitting: Women's Health

## 2019-09-06 VITALS — BP 107/68 | HR 93 | Ht 70.0 in | Wt 157.2 lb

## 2019-09-06 DIAGNOSIS — B009 Herpesviral infection, unspecified: Secondary | ICD-10-CM

## 2019-09-06 DIAGNOSIS — A609 Anogenital herpesviral infection, unspecified: Secondary | ICD-10-CM | POA: Diagnosis not present

## 2019-09-06 MED ORDER — VALACYCLOVIR HCL 1 G PO TABS: 1000.0000 mg | ORAL_TABLET | Freq: Two times a day (BID) | ORAL | 0 refills | Status: DC

## 2019-09-06 MED ORDER — VALACYCLOVIR HCL 1 G PO TABS: 1000.0000 mg | ORAL_TABLET | Freq: Every day | ORAL | 11 refills | Status: DC

## 2019-09-06 NOTE — Progress Notes (Signed)
   GYN VISIT Patient name: Stefanie [name] MRN [id number]  Date of birth: May 30, 1986 Chief Complaint:   Medication Reaction (has blistering in genital area after gentian violet)  History of Present Illness:   Stefanie [name] is a 35 y.o. [phone] Caucasian female being seen today for report of painful blistering at introitus after gentian violet application on Friday for yeast.  Took diflucan as rx'd.      Patient's last menstrual period was 08/13/2019 (exact date). The current method of family planning is oral progesterone-only contraceptive.  Last pap 07/29/18. Results were:  normal Review of Systems:   Pertinent items are noted in HPI Denies fever/chills, dizziness, headaches, visual disturbances, fatigue, shortness of breath, chest pain, abdominal pain, vomiting, abnormal vaginal discharge/itching/odor/irritation, problems with periods, bowel movements, urination, or intercourse unless otherwise stated above.  Pertinent History Reviewed:  Reviewed past medical,surgical, social, obstetrical and family history.  Reviewed problem list, medications and allergies. Physical Assessment:   Vitals:   09/06/19 1118  BP: 107/68  Pulse: 93  Weight: 157 lb 3.2 oz (71.3 kg)  Height: 5\' 10"  (1.778 m)  Body mass index is 22.56 kg/m.       Physical Examination:   General appearance: alert, well appearing, and in no distress  Mental status: alert, oriented to person, place, and time  Skin: warm & dry   Cardiovascular: normal heart rate noted  Respiratory: normal respiratory effort, no distress  Abdomen: soft, non-tender   Pelvic: vulva- introitus/inner labia minora extremely erythematous, ulcerated in spots, co-exam w/ LHE since he saw her Friday, feels it is herpetic, pt does have HSV2  Extremities: no edema   Chaperone: Amanda Rash    No results found for this or any previous visit (from the past 24 hour(s)).  Assessment & Plan:  1) HSV outbreak> rx valtrex 1,000mg  BID x 10d, followed by  once daily suppression since on plaquenil (immunosuppressant) for discoid lupus  Meds:  Meds ordered this encounter  Medications  . valACYclovir (VALTREX) 1000 MG tablet    Sig: Take 1 tablet (1,000 mg total) by mouth 2 (two) times daily. X 10d    Dispense:  20 tablet    Refill:  0    Order Specific Question:   Supervising Provider    Answer:   EURE, LUTHER H [[id number]]  . valACYclovir (VALTREX) 1000 MG tablet    Sig: Take 1 tablet (1,000 mg total) by mouth daily.    Dispense:  30 tablet    Refill:  11    To start after 10d treatment dose    Order Specific Question:   Supervising Provider    Answer:   Sunday H [[id number]]    No orders of the defined types were placed in this encounter.   Return in about 1 year (around 09/05/2020) for Physical.  09/07/2020 CNM, [facility] 09/06/2019 11:45 AM

## 2019-09-06 NOTE — Patient Instructions (Signed)
Valtrex twice daily for 10 days, then daily thereafter

## 2019-09-10 ENCOUNTER — Ambulatory Visit: Payer: No Typology Code available for payment source

## 2019-09-16 ENCOUNTER — Encounter: Payer: Self-pay | Admitting: Family Medicine

## 2019-11-03 ENCOUNTER — Telehealth (INDEPENDENT_AMBULATORY_CARE_PROVIDER_SITE_OTHER): Payer: No Typology Code available for payment source | Admitting: Family Medicine

## 2019-11-03 ENCOUNTER — Telehealth: Payer: Self-pay | Admitting: Family Medicine

## 2019-11-03 ENCOUNTER — Other Ambulatory Visit: Payer: Self-pay

## 2019-11-03 ENCOUNTER — Encounter: Payer: Self-pay | Admitting: Family Medicine

## 2019-11-03 DIAGNOSIS — J0100 Acute maxillary sinusitis, unspecified: Secondary | ICD-10-CM | POA: Diagnosis not present

## 2019-11-03 MED ORDER — FLUCONAZOLE 150 MG PO TABS: ORAL_TABLET | ORAL | 0 refills | Status: DC

## 2019-11-03 MED ORDER — FLUTICASONE PROPIONATE 50 MCG/ACT NA SUSP: 2.0000 | Freq: Every day | NASAL | 0 refills | Status: DC

## 2019-11-03 MED ORDER — AMOXICILLIN 500 MG PO CAPS: 500.0000 mg | ORAL_CAPSULE | Freq: Three times a day (TID) | ORAL | 0 refills | Status: DC

## 2019-11-03 NOTE — Telephone Encounter (Signed)
Called pt and scheduled her a virtual visit for sinus symptoms today

## 2019-11-03 NOTE — Telephone Encounter (Signed)
Ms. Stefanie [name], Stefanie [name] are scheduled for a virtual visit with your provider today.    Just as we do with appointments in the office, we must obtain your consent to participate.  Your consent will be active for this visit and any virtual visit you may have with one of our providers in the next 365 days.    If you have a MyChart account, I can also send a copy of this consent to you electronically.  All virtual visits are billed to your insurance company just like a traditional visit in the office.  As this is a virtual visit, video technology does not allow for your provider to perform a traditional examination.  This may limit your provider's ability to fully assess your condition.  If your provider identifies any concerns that need to be evaluated in person or the need to arrange testing such as labs, EKG, etc, we will make arrangements to do so.    Although advances in technology are sophisticated, we cannot ensure that it will always work on either your end or our end.  If the connection with a video visit is poor, we may have to switch to a telephone visit.  With either a video or telephone visit, we are not always able to ensure that we have a secure connection.   I need to obtain your verbal consent now.   Are you willing to proceed with your visit today?   Stefanie [name] has provided verbal consent on 11/03/2019 for a virtual visit (video or telephone).   Marlowe Shores, LPN 7/51/7001  7:49 PM

## 2019-11-03 NOTE — Progress Notes (Signed)
Patient ID: Stefanie [name], female    DOB: 10-Nov-1985, 34 y.o.   MRN: [id number]   Virtual Visit via phone Note (failed video visit)  I connected with Stefanie [name] on 11/03/19 at  3:00 PM EDT by a video enabled telemedicine application and verified that I am speaking with the correct person using two identifiers.  Location: Patient: car Provider: office   I discussed the limitations of evaluation and management by telemedicine and the availability of in person appointments. The patient expressed understanding and agreed to proceed.   Chief Complaint  Patient presents with  . Cough    Pt is having chest congestion and cough. Pt states that on Monday evening she began to feel a tickle in her throat and yesterday she began with the congestion and cough. Coughing up yellow mucus. Taking Mucinex Sinus. Pt has been fully vaccinated and has not been around any one sick.    Subjective:    Cough Pertinent negatives include no chest pain, chills, ear pain, fever, headaches, postnasal drip, rhinorrhea, sore throat or wheezing.  Had phone visit with pt, due to failed video visit. Pt having worsening cough and head congestion.  Has had some productive cough with yellow sputum.  Started 2 days ago with throat tickling feeling.  Son has been sick with URI symptoms this week. Denies fever, ear pain, HA.  Has been using otc mucinex sinus gel cap. Is utd with covid vaccine.   Medical History Archie has a past medical history of Abnormal Pap smear of cervix, Discoid lupus (10/15/2018), Herpes (10/12/2013), HSV-2 (herpes simplex virus 2) infection, gonorrhea, and Pregnant (06/23/2015).   Outpatient Encounter Medications as of 11/03/2019  Medication Sig  . hydroxychloroquine (PLAQUENIL) 200 MG tablet Take 300 mg by mouth daily.  . norethindrone (MICRONOR) 0.35 MG tablet Take 1 tablet (0.35 mg total) by mouth daily. Take 1 a day  . valACYclovir (VALTREX) 1000 MG tablet Take 1 tablet (1,000 mg  total) by mouth 2 (two) times daily. X 10d  . valACYclovir (VALTREX) 1000 MG tablet Take 1 tablet (1,000 mg total) by mouth daily.  Marland Kitchen amoxicillin (AMOXIL) 500 MG capsule Take 1 capsule (500 mg total) by mouth 3 (three) times daily.  . fluconazole (DIFLUCAN) 150 MG tablet Take 1 tab p.o. once, may repeat in 1 wk.  . fluticasone (FLONASE) 50 MCG/ACT nasal spray Place 2 sprays into both nostrils daily.  . [DISCONTINUED] fluconazole (DIFLUCAN) 100 MG tablet Take 1 tablet (100 mg total) by mouth daily. (Patient not taking: Reported on 11/03/2019)   No facility-administered encounter medications on file as of 11/03/2019.     Review of Systems  Constitutional: Negative for chills and fever.  HENT: Positive for congestion, sinus pressure and sinus pain. Negative for ear pain, postnasal drip, rhinorrhea, sneezing and sore throat.   Eyes: Negative for pain, discharge and itching.  Respiratory: Positive for cough. Negative for wheezing.   Cardiovascular: Negative for chest pain.  Gastrointestinal: Negative for diarrhea, nausea and vomiting.  Neurological: Negative for headaches.     Vitals There were no vitals taken for this visit.  Objective:   Physical Exam  No PE due to failed video visit.  Assessment and Plan   1. Acute non-recurrent maxillary sinusitis - amoxicillin (AMOXIL) 500 MG capsule; Take 1 capsule (500 mg total) by mouth 3 (three) times daily.  Dispense: 30 capsule; Refill: 0 - fluconazole (DIFLUCAN) 150 MG tablet; Take 1 tab p.o. once, may repeat in 1 wk.  Dispense:  2 tablet; Refill: 0 - fluticasone (FLONASE) 50 MCG/ACT nasal spray; Place 2 sprays into both nostrils daily.  Dispense: 16 g; Refill: 0   Gave watch and wait script for amoxicillin for sinusitis and to take if not improving in next 3-5 days.  Discussed viral illness vs bacterial sinusitis. Discussed using nasal saline rinses and flonase.  Robitussin or delsym for coughing and increase fluids. Tylenol/iburpfoen  for pain/fever. Pt had both covid vaccines.  F/u prn. Pt in agreement.     Follow Up Instructions:    I discussed the assessment and treatment plan with the patient. The patient was provided an opportunity to ask questions and all were answered. The patient agreed with the plan and demonstrated an understanding of the instructions.   The patient was advised to call back or seek an in-person evaluation if the symptoms worsen or if the condition fails to improve as anticipated.  I provided 15 minutes of non-face-to-face time during this encounter.

## 2019-12-06 ENCOUNTER — Ambulatory Visit: Payer: No Typology Code available for payment source | Admitting: Obstetrics & Gynecology

## 2019-12-06 ENCOUNTER — Encounter: Payer: Self-pay | Admitting: Obstetrics & Gynecology

## 2019-12-06 VITALS — BP 105/72 | HR 91 | Ht 70.0 in | Wt 158.0 lb

## 2019-12-06 DIAGNOSIS — N831 Corpus luteum cyst of ovary, unspecified side: Secondary | ICD-10-CM | POA: Diagnosis not present

## 2019-12-06 DIAGNOSIS — N946 Dysmenorrhea, unspecified: Secondary | ICD-10-CM | POA: Diagnosis not present

## 2019-12-06 NOTE — Progress Notes (Signed)
Chief Complaint  Patient presents with  . feels pressure with period    worse than period cramps      34 y.o. [id number] Patient's last menstrual period was 12/05/2019. The current method of family planning is OCP (estrogen/progesterone).  Outpatient Encounter Medications as of 12/06/2019  Medication Sig  . fluticasone (FLONASE) 50 MCG/ACT nasal spray Place 2 sprays into both nostrils daily.  . hydroxychloroquine (PLAQUENIL) 200 MG tablet Take 300 mg by mouth daily.  . Probiotic Product (PROBIOTIC PO) Take by mouth daily.  . valACYclovir (VALTREX) 1000 MG tablet Take 1 tablet (1,000 mg total) by mouth 2 (two) times daily. X 10d  . norethindrone (MICRONOR) 0.35 MG tablet Take 1 tablet (0.35 mg total) by mouth daily. Take 1 a day (Patient not taking: Reported on 12/06/2019)  . [DISCONTINUED] amoxicillin (AMOXIL) 500 MG capsule Take 1 capsule (500 mg total) by mouth 3 (three) times daily.  . [DISCONTINUED] fluconazole (DIFLUCAN) 150 MG tablet Take 1 tab p.o. once, may repeat in 1 wk.  . [DISCONTINUED] valACYclovir (VALTREX) 1000 MG tablet Take 1 tablet (1,000 mg total) by mouth daily.   No facility-administered encounter medications on file as of 12/06/2019.    Subjective Pt had right sided pain w/onset of menses yesterday Doubled over kind of pain [facility] never experienced before Past Medical History:  Diagnosis Date  . Abnormal Pap smear of cervix   . Discoid lupus 10/15/2018   Being followed by [facility]ology April 2020 Was seen by [facility] rheumatology they did not feel the patient has systemic lupus  . Herpes 10/12/2013  . HSV-2 (herpes simplex virus 2) infection   . Hx of gonorrhea   . Pregnant 06/23/2015    Past Surgical History:  Procedure Laterality Date  . CHOLECYSTECTOMY N/A 01/02/2015   Procedure: LAPAROSCOPIC CHOLECYSTECTOMY;  Surgeon: Franky Macho, MD;  Location: [facility];  Service: General;  Laterality: N/A;  . COLPOSCOPY     colpo with biopsy  . WISDOM  TOOTH EXTRACTION      OB History    Gravida  4   Para  3   Term  3   Preterm      AB  1   Living  3     SAB  1   TAB      Ectopic      Multiple  0   Live Births  3           No Known Allergies  Social History   Socioeconomic History  . Marital status: Married    Spouse name: Not on file  . Number of children: 3  . Years of education: Not on file  . Highest education level: Not on file  Occupational History  . Not on file  Tobacco Use  . Smoking status: Current Some Day Smoker    Packs/day: 0.25    Years: 10.00    Pack years: 2.50    Types: Cigarettes  . Smokeless tobacco: Never Used  Vaping Use  . Vaping Use: Never used  Substance and Sexual Activity  . Alcohol use: No  . Drug use: No  . Sexual activity: Yes    Birth control/protection: None, Condom  Other Topics Concern  . Not on file  Social History Narrative  . Not on file   Social Determinants of Health   Financial Resource Strain:   . Difficulty of Paying Living Expenses:   Food Insecurity:   . Worried About [employer] of  Food in the Last Year:   . [place] in the Last Year:   Transportation Needs:   . Film/video editor (Medical):   Marland Kitchen Lack of Transportation (Non-Medical):   Physical Activity:   . Days of Exercise per Week:   . Minutes of Exercise per Session:   Stress:   . Feeling of Stress :   Social Connections:   . Frequency of Communication with Friends and Family:   . Frequency of Social Gatherings with Friends and Family:   . Attends Religious Services:   . Active Member of Clubs or Organizations:   . Attends Archivist Meetings:   Marland Kitchen Marital Status:     Family History  Problem Relation Age of Onset  . Heart attack Father   . Heart disease Father   . Diabetes Maternal Uncle   . Dementia Maternal Grandmother   . Cancer Maternal Grandfather        throat, lung, bladder  . Diabetes Paternal Grandmother   . Heart attack Paternal Grandfather     . Heart disease Paternal Grandfather     Medications:       Current Outpatient Medications:  .  fluticasone (FLONASE) 50 MCG/ACT nasal spray, Place 2 sprays into both nostrils daily., Disp: 16 g, Rfl: 0 .  hydroxychloroquine (PLAQUENIL) 200 MG tablet, Take 300 mg by mouth daily., Disp: , Rfl:  .  Probiotic Product (PROBIOTIC PO), Take by mouth daily., Disp: , Rfl:  .  valACYclovir (VALTREX) 1000 MG tablet, Take 1 tablet (1,000 mg total) by mouth 2 (two) times daily. X 10d, Disp: 20 tablet, Rfl: 0 .  norethindrone (MICRONOR) 0.35 MG tablet, Take 1 tablet (0.35 mg total) by mouth daily. Take 1 a day (Patient not taking: Reported on 12/06/2019), Disp: 1 Package, Rfl: 11  Objective Blood pressure 105/72, pulse 91, height 5\' 10"  (1.778 m), weight 158 lb (71.7 kg), last menstrual period 12/05/2019.  Gen WDWN NAD  Pertinent ROS No burning with urination, frequency or urgency No nausea, vomiting or diarrhea Nor fever chills or other constitutional symptoms   Labs or studies     Impression Diagnoses this Encounter::   ICD-10-CM   1. Corpus luteum cyst  N83.10    start micronor as prescribed  2. Dysmenorrhea  N94.6     Established relevant diagnosis(es):   Plan/Recommendations: No orders of the defined types were placed in this encounter.   Labs or Scans Ordered: No orders of the defined types were placed in this encounter.   Management:: >begin micronor w/ NSAIDs if possible >if symptoms w/menses do not improve then let us know and we will schedule a sonogram  Follow up No follow-ups on file.        Face to face time:  20 minutes  Greater than 50% of the visit time was spent in counseling and coordination of care with the patient.  The summary and outline of the counseling and care coordination is summarized in the note above.   All questions were answered.

## 2020-01-19 ENCOUNTER — Other Ambulatory Visit: Payer: Self-pay | Admitting: Women's Health

## 2020-01-19 MED ORDER — VALACYCLOVIR HCL 1 G PO TABS: 1000.0000 mg | ORAL_TABLET | Freq: Two times a day (BID) | ORAL | 0 refills | Status: DC

## 2020-02-23 ENCOUNTER — Telehealth: Payer: Self-pay | Admitting: Women's Health

## 2020-02-23 NOTE — Telephone Encounter (Signed)
Patient can respond via mychart msg or phone call.

## 2020-02-23 NOTE — Telephone Encounter (Signed)
Seems to be having more herpes flares, taking valtrex, to come see me Friday at 1 pm  Has been on doxy for a month

## 2020-02-23 NOTE — Telephone Encounter (Signed)
Patient stated that she has herpes and takes valtrex for this past 14 years. And has recently started new antiobiotic beginning of aug and has recently experienced more flare ups of herpes and valtrex seems to not be helpful would like to know if there is something else she could take that could help with symptoms. Pt states she could respond on mychart or phone call.

## 2020-02-24 ENCOUNTER — Encounter: Payer: Self-pay | Admitting: *Deleted

## 2020-02-25 ENCOUNTER — Encounter: Payer: Self-pay | Admitting: Adult Health

## 2020-02-25 ENCOUNTER — Ambulatory Visit (INDEPENDENT_AMBULATORY_CARE_PROVIDER_SITE_OTHER): Payer: No Typology Code available for payment source | Admitting: Adult Health

## 2020-02-25 VITALS — BP 112/72 | HR 109 | Ht 70.0 in | Wt 164.0 lb

## 2020-02-25 DIAGNOSIS — B009 Herpesviral infection, unspecified: Secondary | ICD-10-CM | POA: Diagnosis not present

## 2020-02-25 MED ORDER — VALACYCLOVIR HCL 1 G PO TABS: 1000.0000 mg | ORAL_TABLET | Freq: Two times a day (BID) | ORAL | 6 refills | Status: DC

## 2020-02-25 MED ORDER — VALACYCLOVIR HCL 1 G PO TABS: 1000.0000 mg | ORAL_TABLET | Freq: Two times a day (BID) | ORAL | 3 refills | Status: DC

## 2020-02-25 NOTE — Progress Notes (Signed)
°  Subjective:     Patient ID: Stefanie [name], female   DOB: [birth year], 34 y.o.   MRN: [id number]  HPI Stefanie [name] is a 34 year old white female,married, [id number], in complaining of herpes flare, has had 2 in last month or so.She is stressed she says and is on doxycyline for skin disease. PCP is [employer].  Review of Systems Has herpes outbreak Reviewed past medical,surgical, social and family history. Reviewed medications and allergies.     Objective:   Physical Exam BP 112/72 (BP Location: Left Arm, Patient Position: Sitting, Cuff Size: Normal)    Pulse (!) 109    Ht 5\' 10"  (1.778 m)    Wt 164 lb (74.4 kg)    LMP 02/24/2020 (Exact Date)    BMI 23.53 kg/m   Skin warm and dry, has brown spots from scarring over arms and legs.Pelvic: external genitalia is normal in appearance, has drying vesicles at base of labia, is on period and has tampon in.   Upstream - 02/25/20 1304      Pregnancy Intention Screening   Does the patient want to become pregnant in the next year? No    Does the patient's partner want to become pregnant in the next year? No    Would the patient like to discuss contraceptive options today? N/A      Contraception Wrap Up   Current Method Oral Contraceptive    End Method Oral Contraceptive    Contraception Counseling Provided No            Examination chaperoned by 04/26/20 LPN. Assessment:     Herpes  Will rx valtrex 1 gm 1 bid for 30 days then 1 daily  #60 with 3 refills      Plan:     Follow up prn

## 2020-03-02 ENCOUNTER — Ambulatory Visit
Admission: EM | Admit: 2020-03-02 | Discharge: 2020-03-02 | Discharge status: Absent without leave | Payer: No Typology Code available for payment source

## 2020-03-21 ENCOUNTER — Other Ambulatory Visit: Payer: No Typology Code available for payment source

## 2020-03-21 ENCOUNTER — Other Ambulatory Visit: Payer: Self-pay | Admitting: *Deleted

## 2020-03-21 DIAGNOSIS — Z20822 Contact with and (suspected) exposure to covid-19: Secondary | ICD-10-CM

## 2020-03-22 LAB — SARS-COV-2, NAA 2 DAY TAT

## 2020-03-22 LAB — NOVEL CORONAVIRUS, NAA: SARS-CoV-2, NAA: NOT DETECTED

## 2020-04-12 ENCOUNTER — Encounter: Payer: Self-pay | Admitting: Family Medicine

## 2020-07-05 ENCOUNTER — Other Ambulatory Visit: Payer: Self-pay

## 2020-07-05 ENCOUNTER — Ambulatory Visit
Admission: EM | Admit: 2020-07-05 | Discharge: 2020-07-05 | Disposition: A | Discharge status: Home / Self Care | Payer: No Typology Code available for payment source | Attending: Emergency Medicine | Admitting: Emergency Medicine

## 2020-07-05 ENCOUNTER — Encounter: Payer: Self-pay | Admitting: Emergency Medicine

## 2020-07-05 DIAGNOSIS — Z1152 Encounter for screening for COVID-19: Secondary | ICD-10-CM

## 2020-07-05 DIAGNOSIS — R519 Headache, unspecified: Secondary | ICD-10-CM | POA: Diagnosis not present

## 2020-07-05 DIAGNOSIS — B9789 Other viral agents as the cause of diseases classified elsewhere: Secondary | ICD-10-CM

## 2020-07-05 DIAGNOSIS — R0981 Nasal congestion: Secondary | ICD-10-CM

## 2020-07-05 DIAGNOSIS — J329 Chronic sinusitis, unspecified: Secondary | ICD-10-CM

## 2020-07-05 MED ORDER — CETIRIZINE HCL 10 MG PO TABS: 10.0000 mg | ORAL_TABLET | Freq: Every day | ORAL | 0 refills | Status: DC

## 2020-07-05 MED ORDER — FLUTICASONE PROPIONATE 50 MCG/ACT NA SUSP: 1.0000 | Freq: Every day | NASAL | 0 refills | Status: DC

## 2020-07-05 MED ORDER — DEXAMETHASONE 4 MG PO TABS: 4.0000 mg | ORAL_TABLET | Freq: Every day | ORAL | 0 refills | Status: AC

## 2020-07-05 MED ORDER — BENZONATATE 100 MG PO CAPS: 100.0000 mg | ORAL_CAPSULE | Freq: Three times a day (TID) | ORAL | 0 refills | Status: DC | PRN

## 2020-07-05 NOTE — Discharge Instructions (Signed)
  COVID testing ordered.  It will take between 2-7 days for test results.  Someone will contact you regarding abnormal results.    Get plenty of rest and push fluids Tessalon Perles prescribed for cough Use OTC zyrtec for nasal congestion, runny nose, and/or sore throat Use OTC flonase for nasal congestion and runny nose Decadron was prescribed Use medications daily for symptom relief Use OTC medications like ibuprofen or tylenol as needed fever or pain Call or go to the ED if you have any new or worsening symptoms such as fever, worsening cough, shortness of breath, chest tightness, chest pain, turning blue, changes in mental status, etc..Marland Kitchen

## 2020-07-05 NOTE — ED Provider Notes (Signed)
[facility]   [id number] 07/05/20 Arrival Time: 1817   CC: COVID symptoms  SUBJECTIVE: History from: patient and family.  Stefanie [name] is a 35 y.o. female who  presented to the urgent care with a complaint of nasal congestion, neck pain, headache and sinus pressure with pain that started today.  Denies sick exposure to COVID, flu or strep.  Denies recent travel.  Has tried OTC medication without relief.  Denies alleviating or aggravating factors.  Denies previous symptoms in the past.   Denies fever, chills, fatigue,  rhinorrhea, sore throat, SOB, wheezing, chest pain, nausea, changes in bowel or bladder habits.     ROS: As per HPI.  All other pertinent ROS negative.     Past Medical History:  Diagnosis Date  . Abnormal Pap smear of cervix   . Discoid lupus 10/15/2018   Being followed by [facility] dermatology April 2020 Was seen by [facility] rheumatology they did not feel the patient has systemic lupus  . Herpes 10/12/2013  . HSV-2 (herpes simplex virus 2) infection   . Hx of gonorrhea   . Pregnant 06/23/2015   Past Surgical History:  Procedure Laterality Date  . CHOLECYSTECTOMY N/A 01/02/2015   Procedure: LAPAROSCOPIC CHOLECYSTECTOMY;  Surgeon: Franky Macho, MD;  Location: [facility];  Service: General;  Laterality: N/A;  . COLPOSCOPY     colpo with biopsy  . WISDOM TOOTH EXTRACTION     No Known Allergies No current facility-administered medications on file prior to encounter.   Current Outpatient Medications on File Prior to Encounter  Medication Sig Dispense Refill  . clobetasol cream (TEMOVATE) 0.05 % Apply topically 2 (two) times daily.    Marland Kitchen doxycycline (VIBRA-TABS) 100 MG tablet Take 100 mg by mouth 2 (two) times daily.    . norethindrone (MICRONOR) 0.35 MG tablet Take 1 tablet (0.35 mg total) by mouth daily. Take 1 a day 1 Package 11  . Probiotic Product (PROBIOTIC PO) Take by mouth daily.    . valACYclovir (VALTREX) 1000 MG tablet Take 1 tablet (1,000 mg  total) by mouth 2 (two) times daily. 60 tablet 3   Social History   Socioeconomic History  . Marital status: Married    Spouse name: Not on file  . Number of children: 3  . Years of education: Not on file  . Highest education level: Not on file  Occupational History  . Not on file  Tobacco Use  . Smoking status: Current Some Day Smoker    Packs/day: 0.25    Years: 10.00    Pack years: 2.50    Types: Cigarettes  . Smokeless tobacco: Never Used  Vaping Use  . Vaping Use: Never used  Substance and Sexual Activity  . Alcohol use: No  . Drug use: No  . Sexual activity: Yes    Birth control/protection: None, Pill  Other Topics Concern  . Not on file  Social History Narrative  . Not on file   Social Determinants of Health   Financial Resource Strain: Not on file  Food Insecurity: Not on file  Transportation Needs: Not on file  Physical Activity: Not on file  Stress: Not on file  Social Connections: Not on file  Intimate Partner Violence: Not on file   Family History  Problem Relation Age of Onset  . Heart attack Father   . Heart disease Father   . Diabetes Maternal Uncle   . Dementia Maternal Grandmother   . Cancer Maternal Grandfather  throat, lung, bladder  . Diabetes Paternal Grandmother   . Heart attack Paternal Grandfather   . Heart disease Paternal Grandfather     OBJECTIVE:  Vitals:   07/05/20 1859 07/05/20 1904  BP: 124/85   Pulse: (!) 105   Resp: 16   Temp: 98.8 F (37.1 C)   SpO2: 99%   Weight:  160 lb (72.6 kg)  Height:  5\' 10"  (1.778 m)     General appearance: alert; appears fatigued, but nontoxic; speaking in full sentences and tolerating own secretions HEENT: NCAT; Ears: EACs clear, TMs pearly gray; Eyes: PERRL.  EOM grossly intact. Sinuses:maxilliary sinuses tender; Nose: nares patent without rhinorrhea, Throat: oropharynx clear, tonsils non erythematous or enlarged, uvula midline  Neck: supple without LAD Lungs: unlabored  respirations, symmetrical air entry; cough: moderate; no respiratory distress; CTAB Heart: regular rate and rhythm.  Radial pulses 2+ symmetrical bilaterally Skin: warm and dry Psychological: alert and cooperative; normal mood and affect  LABS:  No results found for this or any previous visit (from the past 24 hour(s)).   ASSESSMENT & PLAN:  1. Encounter for screening for COVID-19   2. Viral sinusitis   3. Acute nonintractable headache, unspecified headache type   4. Nasal congestion     Meds ordered this encounter  Medications  . dexamethasone (DECADRON) 4 MG tablet    Sig: Take 1 tablet (4 mg total) by mouth daily for 7 days.    Dispense:  7 tablet    Refill:  0  . fluticasone (FLONASE) 50 MCG/ACT nasal spray    Sig: Place 1 spray into both nostrils daily for 14 days.    Dispense:  16 g    Refill:  0  . cetirizine (ZYRTEC ALLERGY) 10 MG tablet    Sig: Take 1 tablet (10 mg total) by mouth daily.    Dispense:  30 tablet    Refill:  0  . benzonatate (TESSALON) 100 MG capsule    Sig: Take 1 capsule (100 mg total) by mouth 3 (three) times daily as needed for cough.    Dispense:  30 capsule    Refill:  0    Discharge Instructions.    COVID testing ordered.  It will take between 2-7 days for test results.  Someone will contact you regarding abnormal results.    Get plenty of rest and push fluids Tessalon Perles prescribed for cough Use OTC zyrtec for nasal congestion, runny nose, and/or sore throat Use OTC flonase for nasal congestion and runny nose Decadron was prescribed Use medications daily for symptom relief Use OTC medications like ibuprofen or tylenol as needed fever or pain Call or go to the ED if you have any new or worsening symptoms such as fever, worsening cough, shortness of breath, chest tightness, chest pain, turning blue, changes in mental status, etc...   Reviewed expectations re: course of current medical issues. Questions answered. Outlined signs and  symptoms indicating need for more acute intervention. Patient verbalized understanding. After Visit Summary given.         , FNP 07/05/20 1943

## 2020-07-05 NOTE — ED Triage Notes (Signed)
Headache, neck pain, teeth hurt, congestion, started today.

## 2020-07-08 LAB — COVID-19, FLU A+B NAA
Influenza A, NAA: NOT DETECTED
Influenza B, NAA: NOT DETECTED
SARS-CoV-2, NAA: DETECTED — AB

## 2020-07-10 ENCOUNTER — Other Ambulatory Visit: Payer: Self-pay

## 2020-07-10 ENCOUNTER — Encounter: Payer: Self-pay | Admitting: Family Medicine

## 2020-07-10 ENCOUNTER — Telehealth (INDEPENDENT_AMBULATORY_CARE_PROVIDER_SITE_OTHER): Payer: No Typology Code available for payment source | Admitting: Family Medicine

## 2020-07-10 DIAGNOSIS — U071 COVID-19: Secondary | ICD-10-CM | POA: Diagnosis not present

## 2020-07-10 NOTE — Progress Notes (Signed)
Patient ID: Stefanie [name], female    DOB: July 02, 1985, 35 y.o.   MRN: [id number]   Chief Complaint  Patient presents with  . Covid Positive   Subjective:    This is a new problem. Presents today via video visit after being diagnosed by urgent care on January 19 with Covid infection. Symptoms include fever, headache, body aches, congestion, feeling winded yesterday with ambulation, sinus pressure, and diarrhea. Denies chills, chest pain, nausea and vomiting. Has tried Zyrtec, and over-the-counter sinus medication. Urgent care prescribed Tessalon Perles, Decadron, Flonase, has not started these medications. Has 3 children at home, only one has been vaccinated. Children are getting PCR tested tomorrow. Patient has had all vaccines including booster, isolating at home from the children, wearing a mask on video visit today. Has oxygen saturation meter at home, checked it after feeling winded yesterday it was 97%. Today it was 100%.   went to urgent care on the 19th with fever 101, headache, and pain all over. Was told to take zyrtec.  took covid test on the 19th and it was positive. Having headache, congestion, diarrhea, and noticed last night that when she walked to the bathroom she felt a little winded. Has been checking her oxygen and it was 97. Face looked swollen last night but back to normal today.  Taking otc sinus meds.   Virtual Visit via Video Note  I connected with Stefanie [name] on 07/10/20 at 11:20 AM EST by a video enabled telemedicine application and verified that I am speaking with the correct person using two identifiers.  Location: Patient: home Provider: office   I discussed the limitations of evaluation and management by telemedicine and the availability of in person appointments. The patient expressed understanding and agreed to proceed.  History of Present Illness:    Observations/Objective:   Assessment and Plan:   Follow Up Instructions:    I discussed the  assessment and treatment plan with the patient. The patient was provided an opportunity to ask questions and all were answered. The patient agreed with the plan and demonstrated an understanding of the instructions.   The patient was advised to call back or seek an in-person evaluation if the symptoms worsen or if the condition fails to improve as anticipated.  I provided 22 minutes of non-face-to-face time during this encounter.       Medical History Stefanie [name] has a past medical history of Abnormal Pap smear of cervix, Discoid lupus (10/15/2018), Herpes (10/12/2013), HSV-2 (herpes simplex virus 2) infection, gonorrhea, and Pregnant (06/23/2015).   Outpatient Encounter Medications as of 07/10/2020  Medication Sig  . cetirizine (ZYRTEC ALLERGY) 10 MG tablet Take 1 tablet (10 mg total) by mouth daily.  . norethindrone (MICRONOR) 0.35 MG tablet Take 1 tablet (0.35 mg total) by mouth daily. Take 1 a day  . Probiotic Product (PROBIOTIC PO) Take by mouth daily.  . valACYclovir (VALTREX) 1000 MG tablet Take 1 tablet (1,000 mg total) by mouth 2 (two) times daily.  . benzonatate (TESSALON) 100 MG capsule Take 1 capsule (100 mg total) by mouth 3 (three) times daily as needed for cough. (Patient not taking: Reported on 07/10/2020)  . clobetasol cream (TEMOVATE) 0.05 % Apply topically 2 (two) times daily.  Marland Kitchen dexamethasone (DECADRON) 4 MG tablet Take 1 tablet (4 mg total) by mouth daily for 7 days. (Patient not taking: Reported on 07/10/2020)  . fluticasone (FLONASE) 50 MCG/ACT nasal spray Place 1 spray into both nostrils daily for 14 days. (Patient not  taking: Reported on 07/10/2020)  . [DISCONTINUED] doxycycline (VIBRA-TABS) 100 MG tablet Take 100 mg by mouth 2 (two) times daily.   No facility-administered encounter medications on file as of 07/10/2020.     Review of Systems  Constitutional: Positive for fatigue and fever. Negative for chills.  HENT: Positive for congestion, ear pain, sinus pressure and  sinus pain.   Respiratory: Positive for shortness of breath. Negative for cough.        Felt winded last night with ambulation to bathroom. Pulse ox 97 % after this.   Gastrointestinal: Positive for diarrhea. Negative for nausea and vomiting.     Vitals There were no vitals taken for this visit. Pulse ox: 100% today Objective:   Physical Exam No PE performed. Conversed throughout video visit with out obvious shortness of breath.  Assessment and Plan   1. COVID-19 virus infection   Recommend supportive therapy, adequate hydration, saline flushes for her sinuses and humidification. Encouraged her to start using the Flonase, that was prescribed by urgent care. She is describing sinus pressure today, discussion concerning bacterial versus viral infections. She will let me know if this pain and pressure is not improved with saline flushes and humidification,  in  2 days, we can consider antibiotic therapy at that time. Encouraged pushing fluids due to diarrhea, which is improving and she is drinking and eating. She will continue to monitor her oxygen saturation, Covid warnings as stated below given.  Agrees with plan of care discussed today. Understands warning signs to seek further care: chest pain, shortness of breath, any significant change in health.  Understands to follow-up if symptoms do not improve, or worsen, will consider antibiotic therapy in 2 days if sinus pressure does not improve. She declines the need for albuterol inhaler, no obvious shortness of breath during video visit today. Denies chest tightness.  Covid-19 warning:  Covid-19 is a virus that causes hypoxia (low oxygen level in blood) in some people. If you develop any changes in your usual breathing pattern: difficulty catching your breath, more short winded with activity or with resting, or anything that concerns you about your breathing, do not hesitate to go to the emergency department immediately for evaluation. Covid  infection can also affect the way the brain functions if it lacks oxygen, such as, feeling dizzy, passing out, or feeling confused, if you experience any of these symptoms, please do not delay to seek treatment.  Some people experience gastrointestinal problems with Covid, such as vomiting and diarrhea, dehydration is a serious risk and should be avoided. If you are unable to keep liquids down you may need to go to the emergency department for intravenous fluids to avoid dehydration.   Please alert and involve your family and/or friends to help keep an eye on you while you recover from Covid-19. If you have any questions or concerns about your recovery, please do not hesitate to call the office for guidance.   Recommend supportive therapy while you are recovering:   1) Get lots of rest.  2) Take over the counter pain medication if needed, such as acetaminophen or ibuprofen. Read and follow instructions on the label and make sure not to combine other medications that may have same ingredients in it. It is important to not take too much of these ingredients.  3) Drink plenty of caffeine-free fluids. (If you have heart or kidney problems, follow the instructions of your specialist regarding amounts).  4) If you are hungry, eat a bland diet, such  as the [employer] (bananas, rice, applesauce, toast).  5) Let us know if you are not feeling better in a week.  Covid-19 Quarantine Instructions:   You have tested positive for Covid-19 infection. The current CDC guidelines for quarantine regardless of vaccination status are:   Please quarantine and isolate at home for a minimum of  5 days.   - If you have no symptoms or your symptoms are resolving after 5 days you   can leave the home (resolving means no shortness of breath, no fever, without taking fever reducing medication, no headache, etc). -Continue to wear a mask around others for an additional 5 days.  -If you were severely ill with Covid-19  you should isolate for at least 10 days.    Use over-the-counter medications for symptoms.If you develop respiratory issues/distress (see Covid warning), seek medical care in the Emergency Department.  If you must leave home or if you have to be around others please wear a mask. Please limit contact with immediate family members in the home, practice social distancing, frequent handwashing and clean hard surfaces touched frequently with household cleaning products. Members of your household will also need to quarantine for 5 days and test on day five if possible.  Covid-19 warning:  Covid-19 is a virus that causes hypoxia (low oxygen level in blood) in some people. If you develop any changes in your usual breathing pattern: difficulty catching your breath, more short winded with activity or with resting, or anything that concerns you about your breathing, do not hesitate to go to the emergency department immediately for evaluation. Covid infection can also affect the way the brain functions if it lacks oxygen, such as, feeling dizzy, passing out, or feeling confused, if you experience any of these symptoms, please do not delay to seek treatment.  Some people experience gastrointestinal problems with Covid, such as vomiting and diarrhea, dehydration is a serious risk and should be avoided. If you are unable to keep liquids down you may need to go to the emergency department for intravenous fluids to avoid dehydration.   Please alert and involve your family and/or friends to help keep an eye on you while you recover from Covid-19. If you have any questions or concerns about your recovery, please do not hesitate to call the office for guidance.       Novella Olive, NP 07/10/2020

## 2020-07-10 NOTE — Patient Instructions (Signed)
Covid-19 warning:  Covid-19 is a virus that causes hypoxia (low oxygen level in blood) in some people. If you develop any changes in your usual breathing pattern: difficulty catching your breath, more short winded with activity or with resting, or anything that concerns you about your breathing, do not hesitate to go to the emergency department immediately for evaluation. Covid infection can also affect the way the brain functions if it lacks oxygen, such as, feeling dizzy, passing out, or feeling confused, if you experience any of these symptoms, please do not delay to seek treatment.  Some people experience gastrointestinal problems with Covid, such as vomiting and diarrhea, dehydration is a serious risk and should be avoided. If you are unable to keep liquids down you may need to go to the emergency department for intravenous fluids to avoid dehydration.   Please alert and involve your family and/or friends to help keep an eye on you while you recover from Covid-19. If you have any questions or concerns about your recovery, please do not hesitate to call the office for guidance.   Covid-19 Quarantine Instructions:   You have tested positive for Covid-19 infection. The current CDC guidelines for quarantine regardless of vaccination status are:   Please quarantine and isolate at home for a minimum of  5 days.   - If you have no symptoms or your symptoms are resolving after 5 days you   can leave the home (resolving means no shortness of breath, no fever, without taking fever reducing medication, no headache, etc). -Continue to wear a mask around others for an additional 5 days.  -If you were severely ill with Covid-19 you should isolate for at least 10 days.    Use over-the-counter medications for symptoms.If you develop respiratory issues/distress (see Covid warning), seek medical care in the Emergency Department.  If you must leave home or if you have to be around others please wear a mask.  Please limit contact with immediate family members in the home, practice social distancing, frequent handwashing and clean hard surfaces touched frequently with household cleaning products. Members of your household will also need to quarantine for 5 days and test on day five if possible.  Covid-19 warning:  Covid-19 is a virus that causes hypoxia (low oxygen level in blood) in some people. If you develop any changes in your usual breathing pattern: difficulty catching your breath, more short winded with activity or with resting, or anything that concerns you about your breathing, do not hesitate to go to the emergency department immediately for evaluation. Covid infection can also affect the way the brain functions if it lacks oxygen, such as, feeling dizzy, passing out, or feeling confused, if you experience any of these symptoms, please do not delay to seek treatment.  Some people experience gastrointestinal problems with Covid, such as vomiting and diarrhea, dehydration is a serious risk and should be avoided. If you are unable to keep liquids down you may need to go to the emergency department for intravenous fluids to avoid dehydration.   Please alert and involve your family and/or friends to help keep an eye on you while you recover from Covid-19. If you have any questions or concerns about your recovery, please do not hesitate to call the office for guidance.    Recommend supportive therapy while you are recovering:   1) Get lots of rest.  2) Take over the counter pain medication if needed, such as acetaminophen or ibuprofen. Read and follow instructions on the label   and make sure not to combine other medications that may have same ingredients in it. It is important to not take too much of these ingredients.  3) Drink plenty of caffeine-free fluids. (If you have heart or kidney problems, follow the instructions of your specialist regarding amounts).  4) If you are hungry, eat a bland diet,  such as the [employer] diet (bananas, rice, applesauce, toast).  5) Let us know if you are not feeling better in a week.  

## 2020-07-24 ENCOUNTER — Other Ambulatory Visit: Payer: Self-pay | Admitting: Adult Health

## 2020-07-24 MED ORDER — SULFAMETHOXAZOLE-TRIMETHOPRIM 800-160 MG PO TABS: 1.0000 | ORAL_TABLET | Freq: Two times a day (BID) | ORAL | 0 refills | Status: DC

## 2020-07-24 NOTE — Progress Notes (Signed)
rx septra ds for ?UTi getting over COVID

## 2020-07-31 ENCOUNTER — Telehealth: Payer: Self-pay | Admitting: Adult Health

## 2020-07-31 NOTE — Telephone Encounter (Signed)
Pt advised she can come in for a nurse visit to check urine. Pt will call back tomorrow am to schedule that visit. Pt voiced understanding. JSY

## 2020-07-31 NOTE — Telephone Encounter (Signed)
Patient has an appointment scheduled with D. Newton on 08/08/2020, but if can be seen sooner, that would be great, per patient.

## 2020-07-31 NOTE — Telephone Encounter (Signed)
Patient called to inform Cyril Mourning that she has finished all of her UTI medication, but is still experiencing pressure and pain during urinating. I informed patient of an open appt this afternoon. Patient couldn't come, because of work ( There wasn't any other open appts available with any of the CNMs for this month). Clinical staff will follow up with patient.

## 2020-08-02 ENCOUNTER — Other Ambulatory Visit: Payer: Self-pay

## 2020-08-02 ENCOUNTER — Encounter: Payer: Self-pay | Admitting: Women's Health

## 2020-08-02 ENCOUNTER — Ambulatory Visit (INDEPENDENT_AMBULATORY_CARE_PROVIDER_SITE_OTHER): Payer: No Typology Code available for payment source | Admitting: Women's Health

## 2020-08-02 ENCOUNTER — Other Ambulatory Visit (HOSPITAL_COMMUNITY)
Admission: RE | Admit: 2020-08-02 | Discharge: 2020-08-02 | Disposition: A | Discharge status: Home / Self Care | Payer: No Typology Code available for payment source | Source: Ambulatory Visit | Attending: Obstetrics & Gynecology | Admitting: Obstetrics & Gynecology

## 2020-08-02 VITALS — BP 123/83 | HR 82 | Ht 70.0 in | Wt 164.5 lb

## 2020-08-02 DIAGNOSIS — R309 Painful micturition, unspecified: Secondary | ICD-10-CM | POA: Diagnosis not present

## 2020-08-02 DIAGNOSIS — Z113 Encounter for screening for infections with a predominantly sexual mode of transmission: Secondary | ICD-10-CM | POA: Insufficient documentation

## 2020-08-02 DIAGNOSIS — R102 Pelvic and perineal pain: Secondary | ICD-10-CM | POA: Diagnosis not present

## 2020-08-02 DIAGNOSIS — N898 Other specified noninflammatory disorders of vagina: Secondary | ICD-10-CM | POA: Diagnosis present

## 2020-08-02 LAB — POCT URINALYSIS DIPSTICK
Blood, UA: NEGATIVE
Glucose, UA: NEGATIVE
Ketones, UA: NEGATIVE
Leukocytes, UA: NEGATIVE
Nitrite, UA: NEGATIVE
Protein, UA: NEGATIVE

## 2020-08-02 MED ORDER — ACYCLOVIR 400 MG PO TABS: 400.0000 mg | ORAL_TABLET | Freq: Two times a day (BID) | ORAL | 11 refills | Status: DC

## 2020-08-02 NOTE — Progress Notes (Signed)
GYN VISIT Patient name: Stefanie [name] MRN [id number]  Date of birth: [birth year] Chief Complaint:   Dysuria (+ pressure; vaginal itching)  History of Present Illness:   Stefanie [name] is a 35 y.o. [phone] Caucasian female being seen today for report of pelvic pressure, recently tx for UTI w/ bactrim for presumed uti. Has finished meds, still has pressure, now w/ vaginal itching. Irritation w/ sex. Frequent hsv outbreaks despite being on valtrex 1gm daily. No increased stress lately.     Depression screen [facility] 2/9 07/29/2018 08/25/2017 07/24/2016  Decreased Interest 0 0 0  Down, Depressed, Hopeless 0 0 0  PHQ - 2 Score 0 0 0    Patient's last menstrual period was 07/08/2020. The current method of family planning is condoms.  Last pap 07/29/18. Results were: NILM w/ HRHPV negative Review of Systems:   Pertinent items are noted in HPI Denies fever/chills, dizziness, headaches, visual disturbances, fatigue, shortness of breath, chest pain, abdominal pain, vomiting, abnormal vaginal discharge/itching/odor/irritation, problems with periods, bowel movements, urination, or intercourse unless otherwise stated above.  Pertinent History Reviewed:  Reviewed past medical,surgical, social, obstetrical and family history.  Reviewed problem list, medications and allergies. Physical Assessment:   Vitals:   08/02/20 1607  BP: 123/83  Pulse: 82  Weight: 164 lb 8 oz (74.6 kg)  Height: 5\' 10"  (1.778 m)  Body mass index is 23.6 kg/m.       Physical Examination:   General appearance: alert, well appearing, and in no distress  Mental status: alert, oriented to person, place, and time  Skin: warm & dry   Cardiovascular: normal heart rate noted  Respiratory: normal respiratory effort, no distress  Abdomen: soft, non-tender   Pelvic: by , SNP VULVA: normal appearing vulva with no masses, tenderness or lesions, VAGINA: normal appearing vagina with normal color and discharge, no lesions, CERVIX:  normal appearing cervix without discharge or lesions  Extremities: no edema   Chaperone: me    Results for orders placed or performed in visit on 08/02/20 (from the past 24 hour(s))  POCT Urinalysis Dipstick   Collection Time: 08/02/20  4:15 PM  Result Value Ref Range   Color, UA     Clarity, UA     Glucose, UA Negative Negative   Bilirubin, UA     Ketones, UA neg    Spec Grav, UA     Blood, UA neg    pH, UA     Protein, UA Negative Negative   Urobilinogen, UA     Nitrite, UA neg    Leukocytes, UA Negative Negative   Appearance     Odor      Assessment & Plan:  1) Pelvic pressure> send urine cx  2) Vaginal itching> send CV swab  3) Frequent HSV outbreaks> despite valtrex 1gm daily suppression, switch to acyclovir 400mg  BID. Can also use L-Lysine (OTC)  Meds:  Meds ordered this encounter  Medications  . acyclovir (ZOVIRAX) 400 MG tablet    Sig: Take 1 tablet (400 mg total) by mouth 2 (two) times daily.    Dispense:  60 tablet    Refill:  11    Order Specific Question:   Supervising Provider    Answer:   08/04/20 H [[id number]]    Orders Placed This Encounter  Procedures  . Urine Culture  . POCT Urinalysis Dipstick    Return in about 1 year (around 08/02/2021) for Physical.  Duane Lope CNM, [facility] 08/02/2020 5:05 PM

## 2020-08-04 LAB — CERVICOVAGINAL ANCILLARY ONLY
Bacterial Vaginitis (gardnerella): NEGATIVE
Candida Glabrata: NEGATIVE
Candida Vaginitis: NEGATIVE
Chlamydia: NEGATIVE
Comment: NEGATIVE
Comment: NEGATIVE
Comment: NEGATIVE
Comment: NEGATIVE
Comment: NEGATIVE
Comment: NORMAL
Neisseria Gonorrhea: NEGATIVE
Trichomonas: NEGATIVE

## 2020-08-04 LAB — URINE CULTURE

## 2020-08-08 ENCOUNTER — Ambulatory Visit: Payer: No Typology Code available for payment source | Admitting: Family Medicine

## 2020-08-16 ENCOUNTER — Encounter: Payer: Self-pay | Admitting: Family Medicine

## 2020-08-16 DIAGNOSIS — Z13220 Encounter for screening for lipoid disorders: Secondary | ICD-10-CM

## 2020-08-16 DIAGNOSIS — Z1329 Encounter for screening for other suspected endocrine disorder: Secondary | ICD-10-CM

## 2020-08-16 DIAGNOSIS — Z131 Encounter for screening for diabetes mellitus: Secondary | ICD-10-CM

## 2020-08-16 DIAGNOSIS — U071 COVID-19: Secondary | ICD-10-CM

## 2020-08-16 NOTE — Telephone Encounter (Signed)
May order TSH, CBC, CMP, lipid, Covid antibody quantitative, A1c per patient request

## 2020-08-16 NOTE — Addendum Note (Signed)
Addended by: Marlowe Shores on: 08/16/2020 02:47 PM   Modules accepted: Orders

## 2020-08-19 LAB — CBC WITH DIFFERENTIAL/PLATELET
Basophils Absolute: 0 10*3/uL (ref 0.0–0.2)
Basos: 1 %
EOS (ABSOLUTE): 0.1 10*3/uL (ref 0.0–0.4)
Eos: 1 %
Hematocrit: 41.6 % (ref 34.0–46.6)
Hemoglobin: 14.4 g/dL (ref 11.1–15.9)
Immature Grans (Abs): 0 10*3/uL (ref 0.0–0.1)
Immature Granulocytes: 0 %
Lymphocytes Absolute: 1.9 10*3/uL (ref 0.7–3.1)
Lymphs: 29 %
MCH: 35.2 pg — ABNORMAL HIGH (ref 26.6–33.0)
MCHC: 34.6 g/dL (ref 31.5–35.7)
MCV: 102 fL — ABNORMAL HIGH (ref 79–97)
Monocytes Absolute: 0.5 10*3/uL (ref 0.1–0.9)
Monocytes: 7 %
Neutrophils Absolute: 4.1 10*3/uL (ref 1.4–7.0)
Neutrophils: 62 %
Platelets: 193 10*3/uL (ref 150–450)
RBC: 4.09 x10E6/uL (ref 3.77–5.28)
RDW: 11.2 % — ABNORMAL LOW (ref 11.7–15.4)
WBC: 6.6 10*3/uL (ref 3.4–10.8)

## 2020-08-19 LAB — COMPREHENSIVE METABOLIC PANEL
ALT: 29 IU/L (ref 0–32)
AST: 23 IU/L (ref 0–40)
Albumin/Globulin Ratio: 1.6 (ref 1.2–2.2)
Albumin: 4.6 g/dL (ref 3.8–4.8)
Alkaline Phosphatase: 78 IU/L (ref 44–121)
BUN/Creatinine Ratio: 18 (ref 9–23)
BUN: 14 mg/dL (ref 6–20)
Bilirubin Total: 0.5 mg/dL (ref 0.0–1.2)
CO2: 23 mmol/L (ref 20–29)
Calcium: 9.2 mg/dL (ref 8.7–10.2)
Chloride: 102 mmol/L (ref 96–106)
Creatinine, Ser: 0.79 mg/dL (ref 0.57–1.00)
Globulin, Total: 2.9 g/dL (ref 1.5–4.5)
Glucose: 89 mg/dL (ref 65–99)
Potassium: 4.3 mmol/L (ref 3.5–5.2)
Sodium: 139 mmol/L (ref 134–144)
Total Protein: 7.5 g/dL (ref 6.0–8.5)
eGFR: 99 mL/min/{1.73_m2} (ref >59)

## 2020-08-19 LAB — LIPID PANEL
Chol/HDL Ratio: 3.5 ratio (ref 0.0–4.4)
Cholesterol, Total: 142 mg/dL (ref 100–199)
HDL: 41 mg/dL (ref >39)
LDL Chol Calc (NIH): 86 mg/dL (ref 0–99)
Triglycerides: 74 mg/dL (ref 0–149)
VLDL Cholesterol Cal: 15 mg/dL (ref 5–40)

## 2020-08-19 LAB — SARS-COV-2 SEMI-QUANTITATIVE TOTAL ANTIBODY, SPIKE
SARS-CoV-2 Semi-Quant Total Ab: >2500 U/mL (ref <0.8)
SARS-CoV-2 Spike Ab Interp: POSITIVE

## 2020-08-19 LAB — HEMOGLOBIN A1C
Est. average glucose Bld gHb Est-mCnc: 97 mg/dL
Hgb A1c MFr Bld: 5 % (ref 4.8–5.6)

## 2020-08-19 LAB — TSH: TSH: 1.62 u[IU]/mL (ref 0.450–4.500)

## 2020-11-13 ENCOUNTER — Other Ambulatory Visit: Payer: Self-pay

## 2020-11-13 ENCOUNTER — Ambulatory Visit
Admission: EM | Admit: 2020-11-13 | Discharge: 2020-11-13 | Disposition: A | Discharge status: Home / Self Care | Payer: No Typology Code available for payment source | Attending: Family Medicine | Admitting: Family Medicine

## 2020-11-13 DIAGNOSIS — B349 Viral infection, unspecified: Secondary | ICD-10-CM | POA: Diagnosis not present

## 2020-11-13 DIAGNOSIS — J029 Acute pharyngitis, unspecified: Secondary | ICD-10-CM

## 2020-11-13 MED ORDER — PROMETHAZINE-DM 6.25-15 MG/5ML PO SYRP
5.0000 mL | ORAL_SOLUTION | Freq: Four times a day (QID) | ORAL | 0 refills | Status: DC | PRN
Start: 2020-11-13 — End: 2020-12-10

## 2020-11-13 MED ORDER — PREDNISONE 20 MG PO TABS: 20.0000 mg | ORAL_TABLET | Freq: Every day | ORAL | 0 refills | Status: AC

## 2020-11-13 NOTE — Discharge Instructions (Addendum)
Resume Stefanie [name]. Take medication prescribed today.

## 2020-11-13 NOTE — ED Provider Notes (Signed)
[facility]    CSN: [id number] Arrival date & time: 11/13/20  0910      History   Chief Complaint No chief complaint on file.   HPI Stefanie [name] is a 35 y.o. female.   HPI Patient presents with URI symptoms including cough, sore throat, otalgia, nasal congestion, and sinus pressure.  She endorses a low-grade fever however has been taken over-the-counter Mucinex DM for management of symptoms.  She endorses chest tightness only when coughing.  No history of asthma.  Denies worrisome symptoms of shortness of breath, weakness, N&V.  Past Medical History:  Diagnosis Date  . Abnormal Pap smear of cervix   . Discoid lupus 10/15/2018   Being followed by [facility] dermatology April 2020 Was seen by [facility] rheumatology they did not feel the patient has systemic lupus  . Herpes 10/12/2013  . HSV-2 (herpes simplex virus 2) infection   . Hx of gonorrhea   . Pregnant 06/23/2015    Patient Active Problem List   Diagnosis Date Noted  . COVID-19 virus infection 07/10/2020  . Discoid lupus 10/15/2018  . Skin rash 07/29/2018  . Unintended weight loss 07/29/2018  . Nephrolithiasis 12/31/2014  . Candidiasis of vulva and vagina 01/12/2014  . Herpes 10/12/2013    Past Surgical History:  Procedure Laterality Date  . CHOLECYSTECTOMY N/A 01/02/2015   Procedure: LAPAROSCOPIC CHOLECYSTECTOMY;  Surgeon: Franky Macho, MD;  Location: [facility];  Service: General;  Laterality: N/A;  . COLPOSCOPY     colpo with biopsy  . WISDOM TOOTH EXTRACTION      OB History    Gravida  4   Para  3   Term  3   Preterm      AB  1   Living  3     SAB  1   IAB      Ectopic      Multiple  0   Live Births  3            Home Medications    Prior to Admission medications   Medication Sig Start Date End Date Taking? Authorizing Provider  acyclovir (ZOVIRAX) 400 MG tablet Take 1 tablet (400 mg total) by mouth 2 (two) times daily. 08/02/20   Cheral Marker, CNM  cetirizine  (ZYRTEC ALLERGY) 10 MG tablet Take 1 tablet (10 mg total) by mouth daily. Patient not taking: Reported on 08/02/2020 07/05/20   Durward Parcel, FNP  clobetasol cream (TEMOVATE) 0.05 % Apply topically as needed. 01/11/20   [provider]  norethindrone (MICRONOR) 0.35 MG tablet Take 1 tablet (0.35 mg total) by mouth daily. Take 1 a day Patient not taking: Reported on 08/02/2020 09/03/19   Lazaro Arms, MD  Probiotic Product (PROBIOTIC PO) Take by mouth daily.    [provider]    Family History Family History  Problem Relation Age of Onset  . Heart attack Father   . Heart disease Father   . Diabetes Maternal Uncle   . Dementia Maternal Grandmother   . Cancer Maternal Grandfather        throat, lung, bladder  . Diabetes Paternal Grandmother   . Heart attack Paternal Grandfather   . Heart disease Paternal Grandfather     Social History Social History   Tobacco Use  . Smoking status: Current Some Day Smoker    Packs/day: 0.25    Years: 10.00    Pack years: 2.50    Types: Cigarettes  . Smokeless tobacco: Never  Used  Vaping Use  . Vaping Use: Never used  Substance Use Topics  . Alcohol use: No  . Drug use: No     Allergies   Patient has no known allergies.   Review of Systems Review of Systems Pertinent negatives listed in HPI Physical Exam Triage Vital Signs ED Triage Vitals  Enc Vitals Group     BP 11/13/20 1000 118/73     Pulse Rate 11/13/20 1000 99     Resp 11/13/20 0959 18     Temp 11/13/20 1000 98.1 F (36.7 C)     Temp Source 11/13/20 0959 Oral     SpO2 11/13/20 1000 98 %     Weight --      Height --      Head Circumference --      Peak Flow --      Pain Score 11/13/20 1001 5     Pain Loc --      Pain Edu? --      Excl. in GC? --    No data found.  Updated Vital Signs BP 118/73 (BP Location: Right Arm)   Pulse 99   Temp 98.1 F (36.7 C) (Oral)   Resp 16   LMP 11/01/2020   SpO2 98%   Visual Acuity Right Eye Distance:    Left Eye Distance:   Bilateral Distance:    Right Eye Near:   Left Eye Near:    Bilateral Near:     Physical Exam  General Appearance:    Alert, cooperative, no distress  HENT:   Normocephalic, ears normal, nares mucosal edema with congestion, rhinorrhea, oropharynx mild erythema no exudate or swelling   Eyes:    PERRL, conjunctiva/corneas clear, EOM's intact       Lungs:     Clear to auscultation bilaterally, respirations unlabored  Heart:    Regular rate and rhythm  Neurologic:   Awake, alert, oriented x 3. No apparent focal neurological           defect.     UC Treatments / Results  Labs (all labs ordered are listed, but only abnormal results are displayed) Labs Reviewed - No data to display  EKG   Radiology No results found.  Procedures Procedures (including critical care time)  Medications Ordered in UC Medications - No data to display  Initial Impression / Assessment and Plan / UC Course  I have reviewed the triage vital signs and the nursing notes.  Pertinent labs & imaging results that were available during my care of the patient were reviewed by me and considered in my medical decision making (see chart for details).    Viral illness involving a sore throat.  Advised patient to discontinue Mucinex D M start Claritin-D, promethazine DM prescribed as needed for cough and a short course of prednisone 20 mg oropharyngeal inflammation and facial pressure due to nasal congestion.  Return precautions given. Final Clinical Impressions(s) / UC Diagnoses   Final diagnoses:  Viral illness  Sore throat     Discharge Instructions     Resume Claritan D. Take medication prescribed today.    ED Prescriptions    None     PDMP not reviewed this encounter.   Bing Neighbors, FNP 11/13/20 1109

## 2020-11-13 NOTE — ED Triage Notes (Signed)
At home covid test was negative

## 2020-11-13 NOTE — ED Triage Notes (Signed)
Scratchy throat that started on Saturday, nasal congestion and cough.

## 2020-12-10 ENCOUNTER — Ambulatory Visit
Admission: EM | Admit: 2020-12-10 | Discharge: 2020-12-10 | Disposition: A | Discharge status: Home / Self Care | Payer: No Typology Code available for payment source | Attending: Emergency Medicine | Admitting: Emergency Medicine

## 2020-12-10 ENCOUNTER — Other Ambulatory Visit: Payer: Self-pay

## 2020-12-10 ENCOUNTER — Encounter: Payer: Self-pay | Admitting: Emergency Medicine

## 2020-12-10 DIAGNOSIS — R0989 Other specified symptoms and signs involving the circulatory and respiratory systems: Secondary | ICD-10-CM

## 2020-12-10 DIAGNOSIS — R198 Other specified symptoms and signs involving the digestive system and abdomen: Secondary | ICD-10-CM

## 2020-12-10 DIAGNOSIS — K209 Esophagitis, unspecified without bleeding: Secondary | ICD-10-CM | POA: Diagnosis not present

## 2020-12-10 MED ORDER — FAMOTIDINE 20 MG PO TABS: 20.0000 mg | ORAL_TABLET | Freq: Two times a day (BID) | ORAL | 0 refills | Status: DC

## 2020-12-10 NOTE — ED Triage Notes (Signed)
Pt began experiencing sensation of something in her throat after drinking soda  a few days ago

## 2020-12-10 NOTE — Discharge Instructions (Addendum)
I suspect you have been irritation/swelling of your esophagus.  Do 5 mL of Benadryl mixed with 5 mL of Maalox 3-4 times a day.  The Benadryl will help with inflammation and the Maalox will coat your throat.  Try and stop smoking.  Start the Pepcid twice a day.  If you are not better, follow-up with GI ASAP.  Go immediately to the emergency department if you are unable to swallow your saliva, for sensation of your throat swelling shut, difficulty breathing, neck stiffness, fevers above 100.4, or for any other concerns

## 2020-12-10 NOTE — ED Provider Notes (Signed)
HPI  SUBJECTIVE:  Stefanie [name] is a 35 y.o. female who presents with "something stuck in my throat" for a week.  She describes it as feeling "like a hair".  She drank some soda 2 days ago followed by a sudden painful "lump in my throat" that has been constant since.  She reports tenderness over the larynx.  She reports equal dysphagia to solids and liquids.  No fevers, drooling, trismus, neck stiffness, voice changes, vomiting, regurgitation, sensation of throat swelling shut, difficulty breathing.  She smokes,  but denies thermal injury, or foreign body ingestion.  No chest pain, shortness of breath although she does report having burning chest pain with eating for the past week.  She states that she is able to "taste her food again."  She tried Pepcid for this twice with some relief.  Denies waterbrash.  No antipyretic in the past 6 hours.  She tried Tylenol and Chloraseptic with improvement in her symptoms.  No aggravating factors.  No history of GERD.  No other medical problems.  LMP: 6/3.  Denies possibility being pregnant.  PMD: Dr. Ladona Ridgel.    Past Medical History:  Diagnosis Date   Abnormal Pap smear of cervix    Discoid lupus 10/15/2018   Being followed by [facility] dermatology April 2020 Was seen by [facility] rheumatology they did not feel the patient has systemic lupus   Herpes 10/12/2013   HSV-2 (herpes simplex virus 2) infection    Hx of gonorrhea    Pregnant 06/23/2015    Past Surgical History:  Procedure Laterality Date   CHOLECYSTECTOMY N/A 01/02/2015   Procedure: LAPAROSCOPIC CHOLECYSTECTOMY;  Surgeon: Franky Macho, MD;  Location: [facility];  Service: General;  Laterality: N/A;   COLPOSCOPY     colpo with biopsy   WISDOM TOOTH EXTRACTION      Family History  Problem Relation Age of Onset   Heart attack Father    Heart disease Father    Diabetes Maternal Uncle    Dementia Maternal Grandmother    Cancer Maternal Grandfather        throat, lung, bladder   Diabetes  Paternal Grandmother    Heart attack Paternal Grandfather    Heart disease Paternal Grandfather     Social History   Tobacco Use   Smoking status: Some Days    Packs/day: 0.25    Years: 10.00    Pack years: 2.50    Types: Cigarettes   Smokeless tobacco: Never  Vaping Use   Vaping Use: Never used  Substance Use Topics   Alcohol use: No   Drug use: No    No current facility-administered medications for this encounter.  Current Outpatient Medications:    famotidine (PEPCID) 20 MG tablet, Take 1 tablet (20 mg total) by mouth 2 (two) times daily., Disp: 40 tablet, Rfl: 0   acyclovir (ZOVIRAX) 400 MG tablet, Take 1 tablet (400 mg total) by mouth 2 (two) times daily., Disp: 60 tablet, Rfl: 11   clobetasol cream (TEMOVATE) 0.05 %, Apply topically as needed., Disp: , Rfl:    Probiotic Product (PROBIOTIC PO), Take by mouth daily., Disp: , Rfl:   No Known Allergies   ROS  As noted in HPI.   Physical Exam  BP 116/79 (BP Location: Right Arm)   Pulse 76   Temp (!) 97.5 F (36.4 C) (Tympanic)   Resp 16   SpO2 98%   Constitutional: Well developed, well nourished, no acute distress Eyes:  EOMI, conjunctiva normal bilaterally HENT: Normocephalic,  atraumatic,mucus membranes moist.  Airway widely patent.  Tonsils normal without exudates.  Uvula midline.  Mild postnasal drip.  Able to visualize deep into the oropharynx.  Unable to visualize epiglottis.  No foreign body, masses seen.  No drooling, trismus, stridor.  Normal voice. Neck: No cervical lymphadenopathy Respiratory: Normal inspiratory effort Cardiovascular: Normal rate GI: nondistended skin: No rash, skin intact Musculoskeletal: no deformities Neurologic: Alert & oriented x 3, no focal neuro deficits Psychiatric: Speech and behavior appropriate   ED Course   Medications - No data to display  No orders of the defined types were placed in this encounter.   No results found for this or any previous visit (from the  past 24 hour(s)). No results found.  ED Clinical Impression  1. Globus sensation   2. Acute esophagitis      ED Assessment/Plan  Patient was given a GI cocktail with complete relief of symptoms.  I suspect an esophagitis.  Doubt esophageal perforation, epiglottitis.  There is no evidence of yeast esophagitis.  She has no drooling, stridor, muffled voice that would point to an airway issue.  Could be combination of her smoking and acid reflux.  We will have her start Pepcid and do Benadryl/Maalox mixture 3-4 times a day.  She is to follow-up with GI if not better in several days.  Discussed MDM, treatment plan, and plan for follow-up with patient. Discussed sn/sx that should prompt return to the ED. patient agrees with plan.   Meds ordered this encounter  Medications   famotidine (PEPCID) 20 MG tablet    Sig: Take 1 tablet (20 mg total) by mouth 2 (two) times daily.    Dispense:  40 tablet    Refill:  0      *This clinic note was created using Scientist, clinical (histocompatibility and immunogenetics). Therefore, there may be occasional mistakes despite careful proofreading.  ?    Domenick Gong, MD 12/11/20 [phone]

## 2020-12-19 ENCOUNTER — Encounter: Payer: Self-pay | Admitting: Internal Medicine

## 2021-02-07 ENCOUNTER — Encounter: Payer: Self-pay | Admitting: Gastroenterology

## 2021-02-07 ENCOUNTER — Ambulatory Visit (INDEPENDENT_AMBULATORY_CARE_PROVIDER_SITE_OTHER): Payer: No Typology Code available for payment source | Admitting: Gastroenterology

## 2021-02-07 ENCOUNTER — Other Ambulatory Visit: Payer: Self-pay

## 2021-02-07 DIAGNOSIS — R0989 Other specified symptoms and signs involving the circulatory and respiratory systems: Secondary | ICD-10-CM | POA: Insufficient documentation

## 2021-02-07 DIAGNOSIS — R198 Other specified symptoms and signs involving the digestive system and abdomen: Secondary | ICD-10-CM | POA: Diagnosis not present

## 2021-02-07 MED ORDER — FAMOTIDINE 20 MG PO TABS: 20.0000 mg | ORAL_TABLET | Freq: Two times a day (BID) | ORAL | 5 refills | Status: AC

## 2021-02-07 NOTE — Patient Instructions (Addendum)
ENT referral as discussed. Please keep me posted on ENT findings. If negative work up, we will proceed with upper endoscopy to evaluate your esophagus.  Continue Pepcid 20mg  twice daily for 2 months, then twice daily only as needed for heartburn, reflux.

## 2021-02-07 NOTE — Progress Notes (Signed)
Primary Care Physician:  Babs Sciara, MD Referring provider: Dr. Domenick Gong, [place] urgent care at [facility] Primary Gastroenterologist:  Roetta Sessions, MD   Chief Complaint  Patient presents with   Sore Throat    Feels like something stuck in throat    HPI:  Stefanie [name] is a 35 y.o. female here at the request of Dr. Chaney Malling at [facility] urgent care at [facility], for globus, acute esophagitis.  Seen in urgent care June 26 with sensation of something being stuck in her throat for about a week.  Says it felt like she had a hair in her throat.  States she had also opened a can of [facility] and as soon as she swallowed it she developed severe pain with sensation of persistent pain/lump feeling.  She was worried that she may have ingested something toxic.  She had pain with swallowing for about a week.  She was started on Pepcid 20 mg twice daily.  A couple of days after being seen in urgent care she started to feel better.  At this time she continues to have little bit of heartburn but overall Pepcid seems to be helping.  Usually if she has heartburn is related to trigger foods.  Still feels like something is in the back of her throat.  No longer painful to swallow.  Denies dysphagia.  Postprandial loose stools with certain foods at times ever since her gallbladder was removed.  No melena or rectal bleeding.  No weight loss.  No allergies.   In retrospect, she feels like she has had some of these throat symptoms dating back to when she had COVID in January 2022.    Current Outpatient Medications  Medication Sig Dispense Refill   acyclovir (ZOVIRAX) 400 MG tablet Take 1 tablet (400 mg total) by mouth 2 (two) times daily. 60 tablet 11   clobetasol cream (TEMOVATE) 0.05 % Apply topically as needed.     famotidine (PEPCID) 20 MG tablet Take 1 tablet (20 mg total) by mouth 2 (two) times daily. (Patient taking differently: Take 20 mg by mouth as needed.) 40 tablet 0    Probiotic Product (PROBIOTIC PO) Take by mouth daily. (Patient not taking: Reported on 02/07/2021)     No current facility-administered medications for this visit.    Allergies as of 02/07/2021   (No Known Allergies)    Past Medical History:  Diagnosis Date   Abnormal Pap smear of cervix    Discoid lupus 10/15/2018   Being followed by [facility] dermatology April 2020 Was seen by [facility] rheumatology they did not feel the patient has systemic lupus   Herpes 10/12/2013   HSV-2 (herpes simplex virus 2) infection    Hx of gonorrhea    Pregnant 06/23/2015    Past Surgical History:  Procedure Laterality Date   CHOLECYSTECTOMY N/A 01/02/2015   Procedure: LAPAROSCOPIC CHOLECYSTECTOMY;  Surgeon: Franky Macho, MD;  Location: [facility];  Service: General;  Laterality: N/A;   COLPOSCOPY     colpo with biopsy   WISDOM TOOTH EXTRACTION      Family History  Problem Relation Age of Onset   Heart attack Father    Heart disease Father    Dementia Maternal Grandmother    Cancer Maternal Grandfather        throat, lung, prostate cancer   Diabetes Paternal Grandmother    Heart attack Paternal Grandfather    Heart disease Paternal Grandfather    Diabetes Maternal Uncle    Colon  cancer Neg Hx     Social History   Socioeconomic History   Marital status: Married    Spouse name: Not on file   Number of children: 3   Years of education: Not on file   Highest education level: Not on file  Occupational History   Not on file  Tobacco Use   Smoking status: Every Day    Packs/day: 0.25    Years: 10.00    Pack years: 2.50    Types: Cigarettes   Smokeless tobacco: Never  Vaping Use   Vaping Use: Never used  Substance and Sexual Activity   Alcohol use: No   Drug use: No   Sexual activity: Yes    Birth control/protection: Condom  Other Topics Concern   Not on file  Social History Narrative   Not on file   Social Determinants of Health   Financial Resource Strain: Not on file  Food  Insecurity: Not on file  Transportation Needs: Not on file  Physical Activity: Not on file  Stress: Not on file  Social Connections: Not on file  Intimate Partner Violence: Not on file      ROS:  General: Negative for anorexia, weight loss, fever, chills, fatigue, weakness. Eyes: Negative for vision changes.  ENT: Negative for hoarseness, difficulty swallowing , nasal congestion.  See HPI CV: Negative for chest pain, angina, palpitations, dyspnea on exertion, peripheral edema.  Respiratory: Negative for dyspnea at rest, dyspnea on exertion, cough, sputum, wheezing.  GI: See history of present illness. GU:  Negative for dysuria, hematuria, urinary incontinence, urinary frequency, nocturnal urination.  MS: Negative for joint pain, low back pain.  Derm: Negative for rash or itching.  Neuro: Negative for weakness, abnormal sensation, seizure, frequent headaches, memory loss, confusion.  Psych: Negative for anxiety, depression, suicidal ideation, hallucinations.  Endo: Negative for unusual weight change.  Heme: Negative for bruising or bleeding. Allergy: Negative for rash or hives.    Physical Examination:  BP 123/81   Pulse 82   Temp 97.7 F (36.5 C) (Temporal)   Ht 5\' 10"  (1.778 m)   Wt 179 lb 3.2 oz (81.3 kg)   LMP 01/10/2021 (Exact Date)   BMI 25.71 kg/m    General: Well-nourished, well-developed in no acute distress.  Head: Normocephalic, atraumatic.   Eyes: Conjunctiva pink, no icterus. Mouth: Oropharyngeal mucosa moist and pink , no lesions erythema or exudate. Neck: Supple without thyromegaly, masses, or lymphadenopathy.  Lungs: Clear to auscultation bilaterally.  Heart: Regular rate and rhythm, no murmurs rubs or gallops.  Abdomen: Bowel sounds are normal, nontender, nondistended, no hepatosplenomegaly or masses, no abdominal bruits or    hernia , no rebound or guarding.   Rectal: not performed Extremities: No lower extremity edema. No clubbing or deformities.   Neuro: Alert and oriented x 4 , grossly normal neurologically.  Skin: Warm and dry, no rash or jaundice.   Psych: Alert and cooperative, normal mood and affect.  Labs: Lab Results  Component Value Date   HGBA1C 5.0 08/18/2020   Lab Results  Component Value Date   CREATININE 0.79 08/18/2020   BUN 14 08/18/2020   NA 139 08/18/2020   K 4.3 08/18/2020   CL 102 08/18/2020   CO2 23 08/18/2020   Lab Results  Component Value Date   ALT 29 08/18/2020   AST 23 08/18/2020   ALKPHOS 78 08/18/2020   BILITOT 0.5 08/18/2020   Lab Results  Component Value Date   WBC 6.6 08/18/2020  HGB 14.4 08/18/2020   HCT 41.6 08/18/2020   MCV 102 (H) 08/18/2020   PLT 193 08/18/2020   Lab Results  Component Value Date   TSH 1.620 08/18/2020     Imaging Studies: No results found.   Assessment:  35 year old female presenting for further evaluation of globus.    Patient states she has had sensation of globus off and on since he had COVID in January 2022.  Back in June however she noted worsening/persistent symptoms for about a week and then developed acute throat/chest pain after drinking a can of [facility].  Those symptoms persisted for over a week.  She was concerned she may have ingested something toxic. She was placed on pepcid 20mg  BID. At this time she has occasional heartburn related to certain foods.  Her biggest complaint is sensation of something in the back of her throat.  She states she feels like she cannot touch it.  She never gets any relief.  Odynophagia has resolved.  In June, I suspect she may have had an element of esophagitis, unclear etiology.  Those symptoms have resolved completely.  Continues to have globus sensation for more than 6 months now.  Her symptoms could be due to atypical GERD but she should see ENT to exclude other etiologies.   Plan:  ENT referral.  If negative work-up, will proceed with upper endoscopy to evaluate esophagus. Continue Pepcid 20 mg twice  daily for 2 months, then decrease to twice daily only as needed for heartburn or reflux.

## 2021-02-14 ENCOUNTER — Encounter: Payer: Self-pay | Admitting: Gastroenterology

## 2021-02-15 ENCOUNTER — Other Ambulatory Visit: Payer: Self-pay | Admitting: *Deleted

## 2021-02-15 DIAGNOSIS — R0989 Other specified symptoms and signs involving the circulatory and respiratory systems: Secondary | ICD-10-CM

## 2021-02-15 DIAGNOSIS — R198 Other specified symptoms and signs involving the digestive system and abdomen: Secondary | ICD-10-CM

## 2021-02-16 ENCOUNTER — Other Ambulatory Visit: Payer: Self-pay

## 2021-02-16 ENCOUNTER — Ambulatory Visit
Admission: EM | Admit: 2021-02-16 | Discharge: 2021-02-16 | Disposition: A | Discharge status: Home / Self Care | Payer: No Typology Code available for payment source

## 2021-02-16 DIAGNOSIS — Z1152 Encounter for screening for COVID-19: Secondary | ICD-10-CM | POA: Diagnosis not present

## 2021-02-16 DIAGNOSIS — J069 Acute upper respiratory infection, unspecified: Secondary | ICD-10-CM

## 2021-02-16 MED ORDER — BENZONATATE 100 MG PO CAPS: 100.0000 mg | ORAL_CAPSULE | Freq: Three times a day (TID) | ORAL | 0 refills | Status: DC

## 2021-02-16 NOTE — ED Provider Notes (Addendum)
[place]   [id number] 02/16/21 Arrival Time: 1914   CC: COVID symptoms  SUBJECTIVE: History from: patient.  Stefanie [name] is a 35 y.o. female who presents with fever, body aches, and headache x today.  Denies sick exposure to COVID, flu or strep.  Denies alleviating or aggravating factors.  Reports previous symptoms in the past with covid.   Denies SOB, wheezing, chest pain, nausea, changes in bowel or bladder habits.    ROS: As per HPI.  All other pertinent ROS negative.     Past Medical History:  Diagnosis Date   Abnormal Pap smear of cervix    Discoid lupus 10/15/2018   Being followed by [facility] dermatology April 2020 Was seen by [facility] rheumatology they did not feel the patient has systemic lupus   Herpes 10/12/2013   HSV-2 (herpes simplex virus 2) infection    Hx of gonorrhea    Pregnant 06/23/2015   Past Surgical History:  Procedure Laterality Date   CHOLECYSTECTOMY N/A 01/02/2015   Procedure: LAPAROSCOPIC CHOLECYSTECTOMY;  Surgeon: Aviva Signs, MD;  Location: [facility];  Service: General;  Laterality: N/A;   COLPOSCOPY     colpo with biopsy   WISDOM TOOTH EXTRACTION     No Known Allergies No current facility-administered medications on file prior to encounter.   Current Outpatient Medications on File Prior to Encounter  Medication Sig Dispense Refill   acyclovir (ZOVIRAX) 400 MG tablet Take 1 tablet (400 mg total) by mouth 2 (two) times daily. 60 tablet 11   clobetasol cream (TEMOVATE) 0.05 % Apply topically as needed.     famotidine (PEPCID) 20 MG tablet Take 1 tablet (20 mg total) by mouth 2 (two) times daily. For two months, then twice daily as needed. 60 tablet 5   ID NOW COVID-19 KIT TEST AS DIRECTED TODAY     Probiotic Product (PROBIOTIC PO) Take by mouth daily. (Patient not taking: No sig reported)     Social History   Socioeconomic History   Marital status: Married    Spouse name: Not on file   Number of children: 3   Years of education:  Not on file   Highest education level: Not on file  Occupational History   Not on file  Tobacco Use   Smoking status: Every Day    Packs/day: 0.25    Years: 10.00    Pack years: 2.50    Types: Cigarettes   Smokeless tobacco: Never  Vaping Use   Vaping Use: Never used  Substance and Sexual Activity   Alcohol use: No   Drug use: No   Sexual activity: Yes    Birth control/protection: Condom  Other Topics Concern   Not on file  Social History Narrative   Not on file   Social Determinants of Health   Financial Resource Strain: Not on file  Food Insecurity: Not on file  Transportation Needs: Not on file  Physical Activity: Not on file  Stress: Not on file  Social Connections: Not on file  Intimate Partner Violence: Not on file   Family History  Problem Relation Age of Onset   Heart attack Father    Heart disease Father    Dementia Maternal Grandmother    Cancer Maternal Grandfather        throat, lung, prostate cancer   Diabetes Paternal Grandmother    Heart attack Paternal Grandfather    Heart disease Paternal Grandfather    Diabetes Maternal Uncle    Colon cancer Neg Hx  OBJECTIVE:  Vitals:   02/16/21 1455  BP: 117/84  Pulse: (!) 118  Resp: 16  Temp: 98.9 F (37.2 C)  TempSrc: Oral  SpO2: 95%    General appearance: alert; appears fatigued, but nontoxic; speaking in full sentences and tolerating own secretions HEENT: NCAT; Ears: EACs clear, TMs pearly gray; Eyes: PERRL.  EOM grossly intact. Sinuses: nontender; Nose: nares patent without rhinorrhea, Throat: oropharynx clear, tonsils non erythematous or enlarged, uvula midline  Neck: supple without LAD Lungs: unlabored respirations, symmetrical air entry; cough: absent; no respiratory distress; CTAB Heart: tachycardic Skin: warm and dry Psychological: alert and cooperative; normal mood and affect  ASSESSMENT & PLAN:  1. Encounter for screening for COVID-19   2. Viral URI     Meds ordered this  encounter  Medications   benzonatate (TESSALON) 100 MG capsule    Sig: Take 1 capsule (100 mg total) by mouth every 8 (eight) hours.    Dispense:  21 capsule    Refill:  0    Order Specific Question:   Supervising Provider    Answer:   Raylene Everts [[id number]]    COVID testing ordered.  It will take between 5-7 days for test results.  Someone will contact you regarding abnormal results.    In the meantime: You should remain isolated in your home for 5 days from symptom onset AND greater than 72 hours after symptoms resolution (absence of fever without the use of fever-reducing medication and improvement in respiratory symptoms), whichever is longer Get plenty of rest and push fluids Tessalon Perles prescribed for cough Use OTC zyrtec for nasal congestion, runny nose, and/or sore throat Use OTC flonase for nasal congestion and runny nose Use medications daily for symptom relief Use OTC medications like ibuprofen or tylenol as needed fever or pain Call or go to the ED if you have any new or worsening symptoms such as fever, worsening cough, shortness of breath, chest tightness, chest pain, turning blue, changes in mental status, etc...   Reviewed expectations re: course of current medical issues. Questions answered. Outlined signs and symptoms indicating need for more acute intervention. Patient verbalized understanding. After Visit Summary given.          Lestine Box, PA-C 02/16/21 [place], [place], PA-C 02/16/21 1534

## 2021-02-16 NOTE — ED Triage Notes (Signed)
Patient presents to Urgent Care with complaints of fever, body aches, and headache since today. Pt states she had a negative at home test. Treating symptoms with tylenol.

## 2021-02-16 NOTE — Discharge Instructions (Addendum)

## 2021-02-17 LAB — NOVEL CORONAVIRUS, NAA: SARS-CoV-2, NAA: NOT DETECTED

## 2021-02-17 LAB — SARS-COV-2, NAA 2 DAY TAT

## 2021-03-26 ENCOUNTER — Telehealth: Payer: Self-pay | Admitting: Internal Medicine

## 2021-03-26 NOTE — Telephone Encounter (Signed)
Pt seen [facility] back in August and was referred to a ENT. She had an appointment scheduled with ENT but canceled due to covid. She can not remember the Doctor's name or where the office was at. She is wanting to reschedule with ENT but needed a number to call. Who did we refer her to? [phone]

## 2021-03-26 NOTE — Telephone Encounter (Signed)
Pt was referred to Dr. Suszanne Conners. Tried to call pt, LMOVM to inform of phone# for Dr. Avel Sensor office.

## 2021-05-13 ENCOUNTER — Other Ambulatory Visit: Payer: Self-pay

## 2021-05-13 ENCOUNTER — Encounter: Payer: Self-pay | Admitting: Emergency Medicine

## 2021-05-13 ENCOUNTER — Ambulatory Visit
Admission: EM | Admit: 2021-05-13 | Discharge: 2021-05-13 | Disposition: A | Discharge status: Home / Self Care | Payer: No Typology Code available for payment source | Attending: Family Medicine | Admitting: Family Medicine

## 2021-05-13 DIAGNOSIS — R6889 Other general symptoms and signs: Secondary | ICD-10-CM

## 2021-05-13 DIAGNOSIS — Z20822 Contact with and (suspected) exposure to covid-19: Secondary | ICD-10-CM | POA: Diagnosis not present

## 2021-05-13 DIAGNOSIS — J209 Acute bronchitis, unspecified: Secondary | ICD-10-CM

## 2021-05-13 MED ORDER — ALBUTEROL SULFATE HFA 108 (90 BASE) MCG/ACT IN AERS
2.0000 | INHALATION_SPRAY | Freq: Once | RESPIRATORY_TRACT | Status: AC
  Administered 2021-05-13: 10:00:00 2 via RESPIRATORY_TRACT

## 2021-05-13 MED ORDER — PREDNISONE 20 MG PO TABS: 40.0000 mg | ORAL_TABLET | Freq: Every day | ORAL | 0 refills | Status: AC

## 2021-05-13 NOTE — Discharge Instructions (Addendum)
  Start prednisone 40 mg once daily for the next 5 days for inflammation in the bronchials which is causing some shortness of breath and wheezing.  Suspect symptoms are secondary to recent infection of influenza given your recent exposure.  You may also use your albuterol inhaler 2 puffs every 4-6 hours as needed for shortness of breath, chest tightness or wheezing.  If any of your breathing symptoms become severe and has not readily improved with albuterol go immediately to the ER.

## 2021-05-13 NOTE — ED Provider Notes (Signed)
[facility]    CSN: [id number] Arrival date & time: 05/13/21  8101      History   Chief Complaint No chief complaint on file.   HPI Stefanie [name] is a 35 y.o. female.   HPI Patient presents today with 5 days of symptoms consisting of cough, chest tightness, shortness of breath, fever, nasal congestion, following exposure to influenza A by her children.  Patient is afebrile at present and has been managing symptoms with over-the-counter medication without any relief.  Of concern she has some chest heaviness and a croupy cough with wheezing intermittently.  She has no history of asthma although is a current daily smoker.  She also endorses some intermittent dizziness along with nausea without vomiting.  Past Medical History:  Diagnosis Date   Abnormal Pap smear of cervix    Discoid lupus 10/15/2018   Being followed by [facility] dermatology April 2020 Was seen by [facility] rheumatology they did not feel the patient has systemic lupus   Herpes 10/12/2013   HSV-2 (herpes simplex virus 2) infection    Hx of gonorrhea    Pregnant 06/23/2015    Patient Active Problem List   Diagnosis Date Noted   Globus sensation 02/07/2021   COVID-19 virus infection 07/10/2020   Discoid lupus 10/15/2018   Skin rash 07/29/2018   Unintended weight loss 07/29/2018   Nephrolithiasis 12/31/2014   Candidiasis of vulva and vagina 01/12/2014   Herpes 10/12/2013    Past Surgical History:  Procedure Laterality Date   CHOLECYSTECTOMY N/A 01/02/2015   Procedure: LAPAROSCOPIC CHOLECYSTECTOMY;  Surgeon: Aviva Signs, MD;  Location: [facility];  Service: General;  Laterality: N/A;   COLPOSCOPY     colpo with biopsy   WISDOM TOOTH EXTRACTION      OB History     Gravida  4   Para  3   Term  3   Preterm      AB  1   Living  3      SAB  1   IAB      Ectopic      Multiple  0   Live Births  3            Home Medications    Prior to Admission medications   Medication  Sig Start Date End Date Taking? Authorizing Provider  predniSONE (DELTASONE) 20 MG tablet Take 2 tablets (40 mg total) by mouth daily with breakfast for 5 days. 05/13/21 05/18/21 Yes Scot Jun, FNP  acyclovir (ZOVIRAX) 400 MG tablet Take 1 tablet (400 mg total) by mouth 2 (two) times daily. 08/02/20   Roma Schanz, CNM  benzonatate (TESSALON) 100 MG capsule Take 1 capsule (100 mg total) by mouth every 8 (eight) hours. 02/16/21   Wurst, [place], PA-C  clobetasol cream (TEMOVATE) 0.05 % Apply topically as needed. 01/11/20   [provider]  famotidine (PEPCID) 20 MG tablet Take 1 tablet (20 mg total) by mouth 2 (two) times daily. For two months, then twice daily as needed. 02/07/21   Mahala Menghini, PA-C  ID NOW COVID-19 KIT TEST AS DIRECTED TODAY 01/10/21   [provider]  Probiotic Product (PROBIOTIC PO) Take by mouth daily. Patient not taking: No sig reported    [provider]    Family History Family History  Problem Relation Age of Onset   Heart attack Father    Heart disease Father    Dementia Maternal Grandmother    Cancer Maternal Grandfather  throat, lung, prostate cancer   Diabetes Paternal Grandmother    Heart attack Paternal Grandfather    Heart disease Paternal Grandfather    Diabetes Maternal Uncle    Colon cancer Neg Hx     Social History Social History   Tobacco Use   Smoking status: Every Day    Packs/day: 0.25    Years: 10.00    Pack years: 2.50    Types: Cigarettes   Smokeless tobacco: Never  Vaping Use   Vaping Use: Never used  Substance Use Topics   Alcohol use: No   Drug use: No     Allergies   Patient has no known allergies.   Review of Systems Review of Systems Pertinent negatives listed in HPI   Physical Exam Triage Vital Signs ED Triage Vitals  Enc Vitals Group     BP 05/13/21 0933 127/81     Pulse Rate 05/13/21 0933 94     Resp 05/13/21 0933 18     Temp 05/13/21 0933 98.4 F (36.9 C)      Temp Source 05/13/21 0933 Oral     SpO2 05/13/21 0933 98 %     Weight --      Height --      Head Circumference --      Peak Flow --      Pain Score 05/13/21 0902 5     Pain Loc --      Pain Edu? --      Excl. in [place]? --    No data found.  Updated Vital Signs BP 127/81 (BP Location: Right Arm)   Pulse 94   Temp 98.4 F (36.9 C) (Oral)   Resp 18   LMP 04/17/2021 (Exact Date)   SpO2 98%   Visual Acuity Right Eye Distance:   Left Eye Distance:   Bilateral Distance:    Right Eye Near:   Left Eye Near:    Bilateral Near:     Physical Exam General appearance: alert, Ill-appearing, no distress Head: Normocephalic, without obvious abnormality, atraumatic ENT: External ears normal, Nares with mucosal edema, congestion, erythematous oropharynx w/o exudate Respiratory: Respirations even , unlabored, coarse lung sound, expiratory wheeze Heart: Rate and rhythm normal. No gallop or murmurs noted on exam  Extremities: No gross deformities Skin: Skin color, texture, turgor normal. No rashes seen  Psych: Appropriate mood and affect. Neurologic: No focal abnormalities  UC Treatments / Results  Labs (all labs ordered are listed, but only abnormal results are displayed) Labs Reviewed  COVID-19, FLU A+B NAA    EKG   Radiology No results found.  Procedures Procedures (including critical care time)  Medications Ordered in UC Medications  albuterol (VENTOLIN HFA) 108 (90 Base) MCG/ACT inhaler 2 puff (2 puffs Inhalation Given 05/13/21 1005)    Initial Impression / Assessment and Plan / UC Course  I have reviewed the triage vital signs and the nursing notes.  Pertinent labs & imaging results that were available during my care of the patient were reviewed by me and considered in my medical decision making (see chart for details).    Acute bronchitis likely secondary to influenza-like illness. Treating with prednisone 40 mg once daily for 5 days along with albuterol inhaler  2 puffs every 4-6 hours as needed.  Strict ER precautions if symptoms worsen or do not readily improve. Final Clinical Impressions(s) / UC Diagnoses   Final diagnoses:  Exposure to COVID-19 virus  Acute bronchitis, unspecified organism  Flu-like symptoms  Discharge Instructions       Start prednisone 40 mg once daily for the next 5 days for inflammation in the bronchials which is causing some shortness of breath and wheezing.  Suspect symptoms are secondary to recent infection of influenza given your recent exposure.  You may also use your albuterol inhaler 2 puffs every 4-6 hours as needed for shortness of breath, chest tightness or wheezing.  If any of your breathing symptoms become severe and has not readily improved with albuterol go immediately to the ER.     ED Prescriptions     Medication Sig Dispense Auth. Provider   predniSONE (DELTASONE) 20 MG tablet Take 2 tablets (40 mg total) by mouth daily with breakfast for 5 days. 10 tablet Scot Jun, FNP      PDMP not reviewed this encounter.   Scot Jun, FNP 05/13/21 1009

## 2021-05-13 NOTE — ED Triage Notes (Signed)
Exposed to flu A. Fever, cough, headache since Wednesday.  States she has a rattle in her chest when she exhales.  Has been taking sudafed, mucinex, tylenol gold and flu.  Tested negative for covid yesterday.

## 2021-05-14 LAB — COVID-19, FLU A+B NAA
Influenza A, NAA: NOT DETECTED
Influenza B, NAA: NOT DETECTED
SARS-CoV-2, NAA: NOT DETECTED

## 2021-05-16 ENCOUNTER — Ambulatory Visit (INDEPENDENT_AMBULATORY_CARE_PROVIDER_SITE_OTHER): Payer: No Typology Code available for payment source | Admitting: Family Medicine

## 2021-05-16 ENCOUNTER — Other Ambulatory Visit: Payer: Self-pay

## 2021-05-16 VITALS — Temp 97.7°F | Wt 176.0 lb

## 2021-05-16 DIAGNOSIS — J189 Pneumonia, unspecified organism: Secondary | ICD-10-CM

## 2021-05-16 DIAGNOSIS — R6889 Other general symptoms and signs: Secondary | ICD-10-CM

## 2021-05-16 MED ORDER — ONDANSETRON HCL 8 MG PO TABS: 8.0000 mg | ORAL_TABLET | Freq: Three times a day (TID) | ORAL | 0 refills | Status: DC | PRN

## 2021-05-16 MED ORDER — AMOXICILLIN-POT CLAVULANATE 875-125 MG PO TABS: 1.0000 | ORAL_TABLET | Freq: Two times a day (BID) | ORAL | 0 refills | Status: DC

## 2021-05-16 NOTE — Patient Instructions (Signed)
Community-Acquired Pneumonia, Adult °Pneumonia is an infection of the lungs. It causes irritation and swelling in the airways of the lungs. Mucus and fluid may also build up inside the airways. This may cause coughing and trouble breathing. °One type of pneumonia can happen while you are in a hospital. A different type can happen when you are not in a hospital (community-acquired pneumonia). °What are the causes? °This condition is caused by germs (viruses, bacteria, or fungi). Some types of germs can spread from person to person. Pneumonia is not thought to spread from person to person. °What increases the risk? °You are more likely to develop this condition if: °You have a long-term (chronic) disease, such as: °Disease of the lungs. This may be chronic obstructive pulmonary disease (COPD) or asthma. °Heart failure. °Cystic fibrosis. °Diabetes. °Kidney disease. °Sickle cell disease. °HIV. °You have other health problems, such as: °Your body's defense system (immune system) is weak. °A condition that may cause you to breathe in fluids from your mouth and nose. °You had your spleen taken out. °You do not take good care of your teeth and mouth (poor dental hygiene). °You use or have used tobacco products. °You travel where the germs that cause this illness are common. °You are near certain animals or the places they live. °You are older than 37 years of age. °What are the signs or symptoms? °Symptoms of this condition include: °A cough. °A fever. °Sweating or chills. °Chest pain, often when you breathe deeply or cough. °Breathing problems, such as: °Fast breathing. °Trouble breathing. °Shortness of breath. °Feeling tired (fatigued). °Muscle aches. °How is this treated? °Treatment for this condition depends on many things, such as: °The cause of your illness. °Your medicines. °Your other health problems. °Most adults can be treated at home. Sometimes, treatment must happen in a hospital. °Treatment may include  medicines to kill germs. °Medicines may depend on which germ caused your illness. °Very bad pneumonia is rare. If you get it, you may: °Have a machine to help you breathe. °Have fluid taken away from around your lungs. °Follow these instructions at home: °Medicines °Take over-the-counter and prescription medicines only as told by your doctor. °Take cough medicine only if you are losing sleep. Cough medicine can keep your body from taking mucus away from your lungs. °If you were prescribed an antibiotic medicine, take it as told by your doctor. Do not stop taking the antibiotic even if you start to feel better. °Lifestyle °  °Do not drink alcohol. °Do not use any products that contain nicotine or tobacco, such as cigarettes, e-cigarettes, and chewing tobacco. If you need help quitting, ask your doctor. °Eat a healthy diet. This includes a lot of vegetables, fruits, whole grains, low-fat dairy products, and low-fat (lean) protein. °General instructions ° °Rest a lot. Sleep for at least 8 hours each night. °Sleep with your head and neck raised. Put a few pillows under your head or sleep in a reclining chair. °Return to your normal activities as told by your doctor. Ask your doctor what activities are safe for you. °Drink enough fluid to keep your pee (urine) pale yellow. °If your throat is sore, rinse your mouth often with salt water. To make salt water, dissolve ½-1 tsp (3-6 g) of salt in 1 cup (237 mL) of warm water. °Keep all follow-up visits as told by your doctor. This is important. °How is this prevented? °You can lower your risk of pneumonia by: °Getting the pneumonia shot (vaccine). These shots have different   types and schedules. Ask your doctor what works best for you. Think about getting this shot if: °You are older than 37 years of age. °You are 19-65 years of age and: °You are being treated for cancer. °You have long-term lung disease. °You have other problems that affect your body's defense system. Ask  your doctor if you have one of these. °Getting your flu shot every year. Ask your doctor which type of shot is best for you. °Going to the dentist as often as told. °Washing your hands often with soap and water for at least 20 seconds. If you cannot use soap and water, use hand sanitizer. °Contact a doctor if: °You have a fever. °You lose sleep because your cough medicine does not help. °Get help right away if: °You are short of breath and this gets worse. °You have more chest pain. °Your sickness gets worse. This is very serious if: °You are an older adult. °Your body's defense system is weak. °You cough up blood. °These symptoms may be an emergency. Do not wait to see if the symptoms will go away. Get medical help right away. Call your local emergency services (911 in the [place] Do not drive yourself to the hospital. °Summary °Pneumonia is an infection of the lungs. °Community-acquired pneumonia affects people who have not been in the hospital. Certain germs can cause this infection. °This condition may be treated with medicines that kill germs. °For very bad pneumonia, you may need a hospital stay and treatment to help with breathing. °This information is not intended to replace advice given to you by your health care provider. Make sure you discuss any questions you have with your health care provider. °Document Revised: 03/16/2019 Document Reviewed: 03/16/2019 °Elsevier Patient Education © 2022 [employer] Inc[employer] ° °

## 2021-05-16 NOTE — Progress Notes (Signed)
   Subjective:    Patient ID: Stefanie [name], female    DOB: [birth year], 35 y.o.   MRN: [id number]  HPI Patient got sick about a week ago with flulike illness ended up going to the urgent care was diagnosed with bronchitis given prednisone and albuterol states having ongoing chest congestion coughing felt terrible yesterday and the day before but not as bad today did have some sweats and chills   Review of Systems     Objective:   Physical Exam  Eardrums are normal throat is normal neck no masses bilateral crackles in the lungs with wheezing consistent with pneumonia versus viral pneumonia not respiratory distress O2 saturation 97 to 99%      Assessment & Plan:  Pneumonia Post flu Triple swab taken Need to rule out COVID If progressive troubles or worse follow-up Warning signs discussed

## 2021-05-17 LAB — COVID-19, FLU A+B AND RSV
Influenza A, NAA: NOT DETECTED
Influenza B, NAA: NOT DETECTED
RSV, NAA: NOT DETECTED
SARS-CoV-2, NAA: NOT DETECTED

## 2021-05-18 ENCOUNTER — Encounter: Payer: Self-pay | Admitting: Family Medicine

## 2021-05-18 ENCOUNTER — Telehealth: Payer: Self-pay | Admitting: Family Medicine

## 2021-05-18 ENCOUNTER — Other Ambulatory Visit: Payer: Self-pay

## 2021-05-18 ENCOUNTER — Ambulatory Visit (HOSPITAL_COMMUNITY)
Admission: RE | Admit: 2021-05-18 | Discharge: 2021-05-18 | Disposition: A | Discharge status: Home / Self Care | Payer: No Typology Code available for payment source | Source: Ambulatory Visit | Attending: Family Medicine | Admitting: Family Medicine

## 2021-05-18 DIAGNOSIS — J189 Pneumonia, unspecified organism: Secondary | ICD-10-CM | POA: Insufficient documentation

## 2021-05-18 NOTE — Telephone Encounter (Signed)
Pt had STAT x-ray ordered today. Results in Epic. Contacted Dr.Scott (ordering provider and staff was instructed to call Dr.Scott once results received). Contacted Dr.Scott; per Dr.Scott Xray looks good, reassuring. Should gradually improve over the next 5-7 days. If come Monday, pt feels to sick to go to work, may call office and we can give work note. If pt feels she needs to be seen next week for recheck, pt is to call the office and we can get her in for recheck. Pt contacted and verbalized understanding.

## 2021-05-18 NOTE — Telephone Encounter (Signed)
We will order a stat chest x-ray this morning.  Please proceed forward to go get it as quickly as possible so that we will have results as the day progresses then we can let you know about the results.  In addition to the crackly sound-are you feeling short of breath when sitting still?  Do you feel like you are having difficulty breathing when you are sitting still or just barely moving around?  If so we may need to see you as well  Thanks-Dr. Lorin Picket

## 2021-05-21 NOTE — Telephone Encounter (Signed)
Nurse please close error °

## 2021-08-16 ENCOUNTER — Other Ambulatory Visit: Payer: Self-pay

## 2021-08-16 ENCOUNTER — Ambulatory Visit (INDEPENDENT_AMBULATORY_CARE_PROVIDER_SITE_OTHER): Payer: No Typology Code available for payment source | Admitting: Family Medicine

## 2021-08-16 VITALS — BP 112/78 | HR 101 | Temp 98.3°F | Wt 183.0 lb

## 2021-08-16 DIAGNOSIS — J0110 Acute frontal sinusitis, unspecified: Secondary | ICD-10-CM | POA: Insufficient documentation

## 2021-08-16 DIAGNOSIS — J029 Acute pharyngitis, unspecified: Secondary | ICD-10-CM | POA: Diagnosis not present

## 2021-08-16 LAB — POCT RAPID STREP A (OFFICE): Rapid Strep A Screen: NEGATIVE

## 2021-08-16 MED ORDER — DOXYCYCLINE HYCLATE 100 MG PO TABS: 100.0000 mg | ORAL_TABLET | Freq: Two times a day (BID) | ORAL | 0 refills | Status: DC

## 2021-08-16 NOTE — Progress Notes (Signed)
? ?Subjective:  ?Patient ID: Stefanie [name], female    DOB: [birth year]  Age: 36 y.o. MRN: [id number] ? ?CC: ?Chief Complaint  ?Patient presents with  ? Sore Throat  ?  Only on left side. Has taking 2 COVID tests both were negative.  ? Sinus Problem  ?  Pressure in face  ? Headache  ? ? ?HPI: ? ?36 year old female presents for evaluation of the above. ? ?Patient states that she has not been feeling well since Saturday.  She has had sinus pressure and congestion.  Associated headache.  Has had some postnasal drip and has also had a sore throat today.  No fever.  Home COVID testing negative x2.  She is concerned that she is developing sinusitis.  No relieving factors.  No other complaints. ? ?Patient Active Problem List  ? Diagnosis Date Noted  ? Acute frontal sinusitis 08/16/2021  ? Discoid lupus 10/15/2018  ? Nephrolithiasis 12/31/2014  ? Herpes 10/12/2013  ? ? ?Social Hx   ?Social History  ? ?Socioeconomic History  ? Marital status: Married  ?  Spouse name: Not on file  ? Number of children: 3  ? Years of education: Not on file  ? Highest education level: Not on file  ?Occupational History  ? Not on file  ?Tobacco Use  ? Smoking status: Every Day  ?  Packs/day: 0.25  ?  Years: 10.00  ?  Pack years: 2.50  ?  Types: Cigarettes  ? Smokeless tobacco: Never  ?Vaping Use  ? Vaping Use: Never used  ?Substance and Sexual Activity  ? Alcohol use: No  ? Drug use: No  ? Sexual activity: Yes  ?  Birth control/protection: Condom  ?Other Topics Concern  ? Not on file  ?Social History Narrative  ? Not on file  ? ?Social Determinants of Health  ? ?Financial Resource Strain: Not on file  ?Food Insecurity: Not on file  ?Transportation Needs: Not on file  ?Physical Activity: Not on file  ?Stress: Not on file  ?Social Connections: Not on file  ? ? ?Review of Systems ?Per HPI ? ?Objective:  ?BP 112/78   Pulse (!) 101   Temp 98.3 ?F (36.8 ?C) (Oral)   Wt 183 lb (83 kg)   SpO2 98%   BMI 26.26 kg/m?  ? ?BP/Weight 08/16/2021 05/16/2021  05/13/2021  ?Systolic BP 112 - 127  ?Diastolic BP 78 - 81  ?Wt. (Lbs) 183 176 -  ?BMI 26.26 25.25 -  ? ? ?Physical Exam ?Vitals and nursing note reviewed.  ?Constitutional:   ?   General: She is not in acute distress. ?   Appearance: Normal appearance. She is not ill-appearing.  ?HENT:  ?   Head: Normocephalic and atraumatic.  ?   Right Ear: Tympanic membrane normal.  ?   Left Ear: Tympanic membrane normal.  ?   Mouth/Throat:  ?   Pharynx: Posterior oropharyngeal erythema present.  ?Cardiovascular:  ?   Rate and Rhythm: Normal rate and regular rhythm.  ?Pulmonary:  ?   Effort: Pulmonary effort is normal.  ?   Breath sounds: Normal breath sounds. No wheezing, rhonchi or rales.  ?Neurological:  ?   Mental Status: She is alert.  ?Psychiatric:     ?   Mood and Affect: Mood normal.     ?   Behavior: Behavior normal.  ? ? ?Lab Results  ?Component Value Date  ? WBC 6.6 08/18/2020  ? HGB 14.4 08/18/2020  ? HCT 41.6 08/18/2020  ?  PLT 193 08/18/2020  ? GLUCOSE 89 08/18/2020  ? CHOL 142 08/18/2020  ? TRIG 74 08/18/2020  ? HDL 41 08/18/2020  ? LDLCALC 86 08/18/2020  ? ALT 29 08/18/2020  ? AST 23 08/18/2020  ? NA 139 08/18/2020  ? K 4.3 08/18/2020  ? CL 102 08/18/2020  ? CREATININE 0.79 08/18/2020  ? BUN 14 08/18/2020  ? CO2 23 08/18/2020  ? TSH 1.620 08/18/2020  ? INR 1.15 01/01/2015  ? HGBA1C 5.0 08/18/2020  ? ? ? ?Assessment & Plan:  ? ?Problem List Items Addressed This Visit   ? ?  ? Respiratory  ? Acute frontal sinusitis - Primary  ?  Treating with doxycycline. ?  ?  ? Relevant Medications  ? doxycycline (VIBRA-TABS) 100 MG tablet  ? ?Other Visit Diagnoses   ? ? Sore throat      ? Relevant Orders  ? POCT rapid strep A (Completed)  ? ?  ? ? ?Meds ordered this encounter  ?Medications  ? doxycycline (VIBRA-TABS) 100 MG tablet  ?  Sig: Take 1 tablet (100 mg total) by mouth 2 (two) times daily.  ?  Dispense:  14 tablet  ?  Refill:  0  ? ? ?Everlene Other DO ?[facility] ? ?

## 2021-08-16 NOTE — Assessment & Plan Note (Signed)
Treating with doxycycline. 

## 2021-08-20 ENCOUNTER — Other Ambulatory Visit: Payer: Self-pay | Admitting: Adult Health

## 2021-08-20 DIAGNOSIS — Z13220 Encounter for screening for lipoid disorders: Secondary | ICD-10-CM

## 2021-08-20 DIAGNOSIS — Z1329 Encounter for screening for other suspected endocrine disorder: Secondary | ICD-10-CM

## 2021-08-20 DIAGNOSIS — Z01419 Encounter for gynecological examination (general) (routine) without abnormal findings: Secondary | ICD-10-CM

## 2021-08-20 NOTE — Progress Notes (Signed)
Check CBC,CMP,TSH and lipids  

## 2021-08-25 LAB — COMPREHENSIVE METABOLIC PANEL
ALT: 7 IU/L (ref 0–32)
AST: 10 IU/L (ref 0–40)
Albumin/Globulin Ratio: 2.2 (ref 1.2–2.2)
Albumin: 4.4 g/dL (ref 3.8–4.8)
Alkaline Phosphatase: 76 IU/L (ref 44–121)
BUN/Creatinine Ratio: 19 (ref 9–23)
BUN: 17 mg/dL (ref 6–20)
Bilirubin Total: 0.4 mg/dL (ref 0.0–1.2)
CO2: 23 mmol/L (ref 20–29)
Calcium: 9.1 mg/dL (ref 8.7–10.2)
Chloride: 103 mmol/L (ref 96–106)
Creatinine, Ser: 0.91 mg/dL (ref 0.57–1.00)
Globulin, Total: 2 g/dL (ref 1.5–4.5)
Glucose: 89 mg/dL (ref 70–99)
Potassium: 4.1 mmol/L (ref 3.5–5.2)
Sodium: 139 mmol/L (ref 134–144)
Total Protein: 6.4 g/dL (ref 6.0–8.5)
eGFR: 83 mL/min/{1.73_m2} (ref >59)

## 2021-08-25 LAB — CBC
Hematocrit: 40.7 % (ref 34.0–46.6)
Hemoglobin: 14.5 g/dL (ref 11.1–15.9)
MCH: 34.8 pg — ABNORMAL HIGH (ref 26.6–33.0)
MCHC: 35.6 g/dL (ref 31.5–35.7)
MCV: 98 fL — ABNORMAL HIGH (ref 79–97)
Platelets: 200 10*3/uL (ref 150–450)
RBC: 4.17 x10E6/uL (ref 3.77–5.28)
RDW: 11.2 % — ABNORMAL LOW (ref 11.7–15.4)
WBC: 4.4 10*3/uL (ref 3.4–10.8)

## 2021-08-25 LAB — LIPID PANEL
Chol/HDL Ratio: 3.5 ratio (ref 0.0–4.4)
Cholesterol, Total: 105 mg/dL (ref 100–199)
HDL: 30 mg/dL — ABNORMAL LOW (ref >39)
LDL Chol Calc (NIH): 64 mg/dL (ref 0–99)
Triglycerides: 44 mg/dL (ref 0–149)
VLDL Cholesterol Cal: 11 mg/dL (ref 5–40)

## 2021-08-25 LAB — TSH: TSH: 1.48 u[IU]/mL (ref 0.450–4.500)

## 2021-08-27 ENCOUNTER — Other Ambulatory Visit (HOSPITAL_COMMUNITY)
Admission: RE | Admit: 2021-08-27 | Discharge: 2021-08-27 | Disposition: A | Discharge status: Home / Self Care | Payer: No Typology Code available for payment source | Source: Ambulatory Visit | Attending: Adult Health | Admitting: Adult Health

## 2021-08-27 ENCOUNTER — Other Ambulatory Visit: Payer: Self-pay

## 2021-08-27 ENCOUNTER — Encounter: Payer: Self-pay | Admitting: Adult Health

## 2021-08-27 ENCOUNTER — Ambulatory Visit (INDEPENDENT_AMBULATORY_CARE_PROVIDER_SITE_OTHER): Payer: No Typology Code available for payment source | Admitting: Adult Health

## 2021-08-27 VITALS — BP 117/80 | HR 78 | Ht 69.0 in | Wt 184.5 lb

## 2021-08-27 DIAGNOSIS — Z3202 Encounter for pregnancy test, result negative: Secondary | ICD-10-CM | POA: Diagnosis not present

## 2021-08-27 DIAGNOSIS — Z30011 Encounter for initial prescription of contraceptive pills: Secondary | ICD-10-CM | POA: Insufficient documentation

## 2021-08-27 DIAGNOSIS — Z01419 Encounter for gynecological examination (general) (routine) without abnormal findings: Secondary | ICD-10-CM | POA: Insufficient documentation

## 2021-08-27 DIAGNOSIS — N943 Premenstrual tension syndrome: Secondary | ICD-10-CM

## 2021-08-27 LAB — POCT URINE PREGNANCY: Preg Test, Ur: NEGATIVE

## 2021-08-27 MED ORDER — NORETHINDRONE 0.35 MG PO TABS: 1.0000 | ORAL_TABLET | Freq: Every day | ORAL | 11 refills | Status: DC

## 2021-08-27 MED ORDER — ACYCLOVIR 400 MG PO TABS: 400.0000 mg | ORAL_TABLET | Freq: Two times a day (BID) | ORAL | 11 refills | Status: DC

## 2021-08-27 NOTE — Progress Notes (Signed)
Patient ID: Stefanie [name], female   DOB: 1985/07/16, 36 y.o.   MRN: [id number] ?History of Present Illness: ? ?Stefanie [name] is a 36 year old white female, married, [id number] in for a well woman gyn exam and pap. ?PCP is Dr Adriana Simas. ? ?Current Medications, Allergies, Past Medical History, Past Surgical History, Family History and Social History were reviewed in [employer] record.   ? ? ?Review of Systems: ? ?Patient denies any headaches, hearing loss, fatigue, blurred vision, shortness of breath, chest pain, abdominal pain, problems with bowel movements, urination, or intercourse. No joint pain or mood swings.  ?Feels mean before periods  ? ?Physical Exam:BP 117/80 (BP Location: Left Arm, Patient Position: Sitting, Cuff Size: Normal)   Pulse 78   Ht 5\' 9"  (1.753 m)   Wt 184 lb 8 oz (83.7 kg)   LMP 08/05/2021   BMI 27.25 kg/m?  UPT is negative ?General:  Well developed, well nourished, no acute distress ?Skin:  Warm and dry ?Neck:  Midline trachea, normal thyroid, good ROM, no lymphadenopathy ?Lungs; Clear to auscultation bilaterally ?Breast:  No dominant palpable mass, retraction, or nipple discharge ?Cardiovascular: Regular rate and rhythm ?Abdomen:  Soft, non tender, no hepatosplenomegaly ?Pelvic:  External genitalia is normal in appearance, no lesions.  The vagina is normal in appearance. Urethra has no lesions or masses. The cervix is bulbous. Pap with HR HPV genotyping performed.  Uterus is felt to be normal size, shape, and contour.  No adnexal masses or tenderness noted.Bladder is non tender, no masses felt. ?Rectal: Deferred.  ?Extremities/musculoskeletal:  No swelling or varicosities noted, no clubbing or cyanosis ?Psych:  No mood changes, alert and cooperative,seems happy ?AA is 1 ?Fall risk is low ?Depression screen [facility] 2/9 08/27/2021 07/29/2018 08/25/2017  ?Decreased Interest 0 0 0  ?Down, Depressed, Hopeless 1 0 0  ?PHQ - 2 Score 1 0 0  ?Altered sleeping 0 - -  ?Tired, decreased energy 1 -  -  ?Change in appetite 0 - -  ?Feeling bad or failure about yourself  0 - -  ?Trouble concentrating 0 - -  ?Moving slowly or fidgety/restless 0 - -  ?Suicidal thoughts 0 - -  ?PHQ-9 Score 2 - -  ?  ?GAD 7 : Generalized Anxiety Score 08/27/2021  ?Nervous, Anxious, on Edge 1  ?Control/stop worrying 1  ?Worry too much - different things 1  ?Trouble relaxing 1  ?Restless 0  ?Easily annoyed or irritable 1  ?Afraid - awful might happen 1  ?Total GAD 7 Score 6  ? ? Upstream - 08/27/21 1504   ? ?  ? Pregnancy Intention Screening  ? Does the patient want to become pregnant in the next year? No   ? Does the patient's partner want to become pregnant in the next year? No   ? Would the patient like to discuss contraceptive options today? Yes   ?  ? Contraception Wrap Up  ? Current Method Female Condom   ? End Method Oral Contraceptive   ? Contraception Counseling Provided Yes   ? ?  ?  ? ?  ?  ? Examination chaperoned by 08/29/21 LPN ? ?Impression and Plan: ?1. Encounter for gynecological examination with Papanicolaou smear of cervix ?Pap sent ?Physical in 1 year ?Pap in 3 years if normal ?Reviewed labs with her, done 08/24/21  ? ?2. Pregnancy examination or test, negative result ? ? ?3. Oral contraception initial prescription ?Will rx POP since smokes, rx Micronor, to start with next  period and use condoms ?Meds ordered this encounter  ?Medications  ? norethindrone (MICRONOR) 0.35 MG tablet  ?  Sig: Take 1 tablet (0.35 mg total) by mouth daily.  ?  Dispense:  28 tablet  ?  Refill:  11  ?  Order Specific Question:   Supervising Provider  ?  Answer:   Duane Lope H [[id number]]  ? acyclovir (ZOVIRAX) 400 MG tablet  ?  Sig: Take 1 tablet (400 mg total) by mouth 2 (two) times daily.  ?  Dispense:  60 tablet  ?  Refill:  11  ?  Order Specific Question:   Supervising Provider  ?  Answer:   Duane Lope H [[id number]]  ? Follow up in 3 months on POP  ?Refilled acyclovir at her request ? ? ?4. PMS (premenstrual syndrome) ?Will see if POP helps, if  not will try SSRI ? ? ? ?  ?  ?

## 2021-08-31 LAB — CYTOLOGY - PAP
Comment: NEGATIVE
Diagnosis: NEGATIVE
High risk HPV: NEGATIVE

## 2021-11-15 ENCOUNTER — Ambulatory Visit (INDEPENDENT_AMBULATORY_CARE_PROVIDER_SITE_OTHER): Payer: No Typology Code available for payment source | Admitting: Family Medicine

## 2021-11-15 VITALS — HR 85 | Temp 98.4°F | Wt 189.4 lb

## 2021-11-15 DIAGNOSIS — J069 Acute upper respiratory infection, unspecified: Secondary | ICD-10-CM

## 2021-11-15 NOTE — Progress Notes (Signed)
   Subjective:    Patient ID: Stefanie [name], female    DOB: 01/28/1986, 36 y.o.   MRN: [id number]  HPI  Patient having congestion, cough and runny nose. Started Sunday evening and it's getting worse. No fever. Has tested self 3 different times for COVID, all came back negative.  Review of Systems     Objective:   Physical Exam Gen-NAD not toxic TMS-normal bilateral T- normal no redness Chest-CTA respiratory rate normal no crackles CV RRR no murmur Skin-warm dry Neuro-grossly normal        Assessment & Plan:  Appears to be a viral respiratory illness no sign of bacterial infection no pneumonia no sinus tenderness supportive measures if not dramatically better by early next week call back may need to initiate antibiotics

## 2021-11-19 ENCOUNTER — Encounter: Payer: Self-pay | Admitting: Family Medicine

## 2021-11-19 ENCOUNTER — Other Ambulatory Visit: Payer: Self-pay | Admitting: *Deleted

## 2021-11-19 MED ORDER — AMOXICILLIN-POT CLAVULANATE 875-125 MG PO TABS: ORAL_TABLET | ORAL | 0 refills | Status: DC

## 2021-11-19 NOTE — Telephone Encounter (Signed)
Nurses-please touch base with Brissa  Most likely the chest congestion is just a extension of the virus that she started with but we need to make sure it does not turn into something worse.  If she starts having shortness of breath or progressive symptoms I would recommend getting rechecked otherwise please do the following  It would be reasonable to do Augmentin 875 mg 1 twice daily for 7 days take with a snack and a tall glass of liquids.  A small number of people this can cause upset stomach but most people tolerate the medicine fairly well.  Thanks-Dr. Lorin Picket

## 2021-11-27 ENCOUNTER — Ambulatory Visit (INDEPENDENT_AMBULATORY_CARE_PROVIDER_SITE_OTHER): Payer: No Typology Code available for payment source | Admitting: Adult Health

## 2021-11-27 ENCOUNTER — Encounter: Payer: Self-pay | Admitting: Adult Health

## 2021-11-27 VITALS — BP 111/84 | HR 86 | Ht 69.0 in | Wt 192.0 lb

## 2021-11-27 DIAGNOSIS — Z3041 Encounter for surveillance of contraceptive pills: Secondary | ICD-10-CM

## 2021-11-27 NOTE — Progress Notes (Signed)
  Subjective:     Patient ID: Stefanie [name], female   DOB: Dec 26, 1985, 36 y.o.   MRN: [id number]  HPI Stefanie [name] is a 36 year old white female, married, [id number], back in follow up on taking Micronor. She has gained about 8 lbs and had some breast tenderness but that is better, has had period every month.  Lab Results  Component Value Date   DIAGPAP  08/27/2021    - Negative for intraepithelial lesion or malignancy (NILM)   HPV NOT DETECTED 07/29/2018   [place] Negative 08/27/2021   PCP is Dr Lacinda Axon,  Review of Systems +weight gain +breast tenderness but better    Has had period every month Reviewed past medical,surgical, social and family history. Reviewed medications and allergies.  Objective:   Physical Exam BP 111/84 (BP Location: Left Arm, Patient Position: Sitting, Cuff Size: Normal)   Pulse 86   Ht 5\' 9"  (1.753 m)   Wt 192 lb (87.1 kg)   LMP 11/18/2021   BMI 28.35 kg/m     Skin warm and dry.Lungs: clear to ausculation bilaterally. Cardiovascular: regular rate and rhythm.   [place] - 11/27/21 1604       Pregnancy Intention Screening   Does the patient want to become pregnant in the next year? No    Does the patient's partner want to become pregnant in the next year? No    Would the patient like to discuss contraceptive options today? No      Contraception Wrap Up   Current Method Oral Contraceptive    End Method Oral Contraceptive             Assessment:     1. Encounter for surveillance of contraceptive pills Continue Micronor for now, has refills, but can stop taking anytime she wants    Plan:     Follow up prn

## 2021-12-23 ENCOUNTER — Encounter: Payer: Self-pay | Admitting: Nurse Practitioner

## 2021-12-23 ENCOUNTER — Ambulatory Visit
Admission: EM | Admit: 2021-12-23 | Discharge: 2021-12-23 | Disposition: A | Discharge status: Home / Self Care | Payer: No Typology Code available for payment source | Attending: Nurse Practitioner | Admitting: Nurse Practitioner

## 2021-12-23 DIAGNOSIS — N3001 Acute cystitis with hematuria: Secondary | ICD-10-CM | POA: Insufficient documentation

## 2021-12-23 LAB — POCT URINALYSIS DIP (MANUAL ENTRY)
Bilirubin, UA: NEGATIVE
Glucose, UA: NEGATIVE mg/dL
Ketones, POC UA: NEGATIVE mg/dL
Nitrite, UA: POSITIVE — AB
Protein Ur, POC: 30 mg/dL — AB
Spec Grav, UA: 1.01 (ref 1.010–1.025)
Urobilinogen, UA: 0.2 E.U./dL
pH, UA: 7 (ref 5.0–8.0)

## 2021-12-23 MED ORDER — ONDANSETRON HCL 4 MG PO TABS: 4.0000 mg | ORAL_TABLET | Freq: Three times a day (TID) | ORAL | 0 refills | Status: DC | PRN

## 2021-12-23 MED ORDER — AMOXICILLIN-POT CLAVULANATE 875-125 MG PO TABS: 1.0000 | ORAL_TABLET | Freq: Two times a day (BID) | ORAL | 0 refills | Status: DC

## 2021-12-23 NOTE — Discharge Instructions (Addendum)
-  Take medications as prescribed. -Increase fluids. -Ibuprofen or Tylenol for pain, fever, or general discomfort. -Develop a toileting schedule that will allow you to toilet at least every 2 hours. -Avoid caffeine to include tea, soda, and coffee. -If sexually active, void at least 15 to 20 minutes after sexual intercourse. -Follow-up in the emergency department if you develop fever, chills, worsening abdominal pain, or other concerns.  

## 2021-12-23 NOTE — ED Triage Notes (Signed)
Pt presents for dysuria x 2-3 days.

## 2021-12-23 NOTE — ED Provider Notes (Signed)
[facility]    CSN: [id number] Arrival date & time: 12/23/21  1406      History   Chief Complaint Chief Complaint  Patient presents with   Dysuria    HPI Stefanie [name] is a 36 y.o. female.   The history is provided by the patient.   Patient presents for complaints of pain with urination, frequency, hematuria, and suprapubic pressure that been present for the past 2 to 3 days.  She denies fever, chills, flank pain, low back pain, abdominal pain, nausea, vomiting, or diarrhea.  Patient denies history recurrent urinary tract infections.  She does have a history of kidney stones.  She has not taken any medication for her symptoms.  Past Medical History:  Diagnosis Date   Abnormal Pap smear of cervix    Discoid lupus 10/15/2018   Being followed by [facility] dermatology April 2020 Was seen by [facility] rheumatology they did not feel the patient has systemic lupus   Herpes 10/12/2013   HSV-2 (herpes simplex virus 2) infection    Hx of gonorrhea    Pregnant 06/23/2015    Patient Active Problem List   Diagnosis Date Noted   Encounter for surveillance of contraceptive pills 11/27/2021   PMS (premenstrual syndrome) 08/27/2021   Oral contraception initial prescription 08/27/2021   Pregnancy examination or test, negative result 08/27/2021   Encounter for gynecological examination with Papanicolaou smear of cervix 08/27/2021   Acute frontal sinusitis 08/16/2021   Discoid lupus 10/15/2018   Nephrolithiasis 12/31/2014   Herpes 10/12/2013    Past Surgical History:  Procedure Laterality Date   CHOLECYSTECTOMY N/A 01/02/2015   Procedure: LAPAROSCOPIC CHOLECYSTECTOMY;  Surgeon: Aviva Signs, MD;  Location: [facility];  Service: General;  Laterality: N/A;   COLPOSCOPY     colpo with biopsy   WISDOM TOOTH EXTRACTION      OB History     Gravida  4   Para  3   Term  3   Preterm      AB  1   Living  3      SAB  1   IAB      Ectopic      Multiple  0   Live  Births  3            Home Medications    Prior to Admission medications   Medication Sig Start Date End Date Taking? Authorizing Provider  amoxicillin-clavulanate (AUGMENTIN) 875-125 MG tablet Take 1 tablet by mouth every 12 (twelve) hours. 12/23/21  Yes Lorianna Spadaccini-Warren, Alda Lea, NP  ondansetron (ZOFRAN) 4 MG tablet Take 1 tablet (4 mg total) by mouth every 8 (eight) hours as needed for nausea or vomiting. 12/23/21  Yes Ethelene Closser-Warren, Alda Lea, NP  acyclovir (ZOVIRAX) 400 MG tablet Take 1 tablet (400 mg total) by mouth 2 (two) times daily. 08/27/21   Estill Dooms, NP  clobetasol cream (TEMOVATE) 0.05 % Apply topically as needed. 01/11/20   [provider]  famotidine (PEPCID) 20 MG tablet Take 1 tablet (20 mg total) by mouth 2 (two) times daily. For two months, then twice daily as needed. 02/07/21   Mahala Menghini, PA-C  norethindrone (MICRONOR) 0.35 MG tablet Take 1 tablet (0.35 mg total) by mouth daily. 08/27/21   Estill Dooms, NP    Family History Family History  Problem Relation Age of Onset   Heart attack Father    Heart disease Father    Dementia Maternal Grandmother    Cancer Maternal  Grandfather        throat, lung, prostate cancer   Diabetes Paternal Grandmother    Heart attack Paternal Grandfather    Heart disease Paternal Grandfather    Diabetes Maternal Uncle    Colon cancer Neg Hx     Social History Social History   Tobacco Use   Smoking status: Every Day    Packs/day: 0.25    Years: 20.00    Total pack years: 5.00    Types: Cigarettes   Smokeless tobacco: Never  Vaping Use   Vaping Use: Never used  Substance Use Topics   Alcohol use: No   Drug use: No     Allergies   Patient has no known allergies.   Review of Systems Review of Systems Per HPI  Physical Exam Triage Vital Signs ED Triage Vitals  Enc Vitals Group     BP 12/23/21 1505 115/79     Pulse Rate 12/23/21 1506 89     Resp --      Temp 12/23/21 1506 97.9 F (36.6  C)     Temp Source 12/23/21 1506 Oral     SpO2 12/23/21 1505 98 %     Weight --      Height --      Head Circumference --      Peak Flow --      Pain Score --      Pain Loc --      Pain Edu? --      Excl. in GC? --    No data found.  Updated Vital Signs BP 115/79 (BP Location: Left Arm)   Pulse 89   Temp 97.9 F (36.6 C) (Oral)   LMP 11/18/2021   SpO2 98%   Visual Acuity Right Eye Distance:   Left Eye Distance:   Bilateral Distance:    Right Eye Near:   Left Eye Near:    Bilateral Near:     Physical Exam Vitals and nursing note reviewed.  Constitutional:      General: She is not in acute distress.    Appearance: Normal appearance. She is well-developed.  HENT:     Head: Normocephalic.     Mouth/Throat:     Mouth: Mucous membranes are moist.  Eyes:     Extraocular Movements: Extraocular movements intact.     Pupils: Pupils are equal, round, and reactive to light.  Cardiovascular:     Rate and Rhythm: Regular rhythm.     Pulses: Normal pulses.     Heart sounds: Normal heart sounds.  Pulmonary:     Effort: Pulmonary effort is normal.     Breath sounds: Normal breath sounds.  Abdominal:     General: Bowel sounds are normal. There is no distension.     Palpations: Abdomen is soft.     Tenderness: There is abdominal tenderness in the suprapubic area. There is no right CVA tenderness, left CVA tenderness, guarding or rebound.  Genitourinary:    Vagina: Normal. No vaginal discharge.  Musculoskeletal:     Cervical back: Normal range of motion.  Skin:    General: Skin is warm and dry.     Findings: No erythema or rash.  Neurological:     General: No focal deficit present.     Mental Status: She is alert and oriented to person, place, and time.     Cranial Nerves: No cranial nerve deficit.  Psychiatric:        Mood and Affect: Mood normal.  Behavior: Behavior normal.      UC Treatments / Results  Labs (all labs ordered are listed, but only  abnormal results are displayed) Labs Reviewed  POCT URINALYSIS DIP (MANUAL ENTRY) - Abnormal; Notable for the following components:      Result Value   Color, UA light yellow (*)    Clarity, UA cloudy (*)    Blood, UA large (*)    Protein Ur, POC =30 (*)    Nitrite, UA Positive (*)    Leukocytes, UA Small (1+) (*)    All other components within normal limits  URINE CULTURE    EKG   Radiology No results found.  Procedures Procedures (including critical care time)  Medications Ordered in UC Medications - No data to display  Initial Impression / Assessment and Plan / UC Course  I have reviewed the triage vital signs and the nursing notes.  Pertinent labs & imaging results that were available during my care of the patient were reviewed by me and considered in my medical decision making (see chart for details).  Patient presents for urinary tract infection symptoms that been present for the past 2 to 3 days.  Urinalysis is positive for nitrates and leukocytes.  There is no concern for pyelonephritis in the absence of CVA tenderness.  Patient does have hematuria on the urinalysis as well.  We will treat for urinary tract infection with Augmentin.  We will also prescribe Zofran as patient reports that she gets nauseated with Augmentin.  Supportive care recommendations were provided to the patient.  Urine culture is pending to ensure patient is being treated with the appropriate antibiotic.  Indications of when to go to the emergency department were provided to the patient.  Patient advised to follow-up if symptoms do not improve. Final Clinical Impressions(s) / UC Diagnoses   Final diagnoses:  Acute cystitis with hematuria     Discharge Instructions      -Take medications as prescribed. -Increase fluids. -Ibuprofen or Tylenol for pain, fever, or general discomfort. -Develop a toileting schedule that will allow you to toilet at least every 2 hours. -Avoid caffeine to include  tea, soda, and coffee. -If sexually active, void at least 15 to 20 minutes after sexual intercourse. -Follow-up in the emergency department if you develop fever, chills, worsening abdominal pain, or other concerns.     ED Prescriptions     Medication Sig Dispense Auth. Provider   amoxicillin-clavulanate (AUGMENTIN) 875-125 MG tablet Take 1 tablet by mouth every 12 (twelve) hours. 14 tablet Revonda Menter-Warren, Sadie Haber, NP   ondansetron (ZOFRAN) 4 MG tablet Take 1 tablet (4 mg total) by mouth every 8 (eight) hours as needed for nausea or vomiting. 20 tablet Jackie Russman-Warren, Sadie Haber, NP      PDMP not reviewed this encounter.   Abran Cantor, NP 12/23/21 1552

## 2021-12-25 LAB — URINE CULTURE: Culture: >=100000 — AB

## 2022-04-11 ENCOUNTER — Other Ambulatory Visit (HOSPITAL_COMMUNITY)
Admission: RE | Admit: 2022-04-11 | Discharge: 2022-04-11 | Disposition: A | Discharge status: Home / Self Care | Payer: No Typology Code available for payment source | Source: Ambulatory Visit | Attending: Nurse Practitioner | Admitting: Nurse Practitioner

## 2022-04-11 ENCOUNTER — Ambulatory Visit: Payer: No Typology Code available for payment source | Admitting: Nurse Practitioner

## 2022-04-11 VITALS — BP 120/80 | Temp 98.1°F | Ht 69.0 in | Wt 200.0 lb

## 2022-04-11 DIAGNOSIS — M79602 Pain in left arm: Secondary | ICD-10-CM | POA: Diagnosis present

## 2022-04-11 LAB — D-DIMER, QUANTITATIVE: D-Dimer, Quant: 0.41 ug/mL-FEU (ref 0.00–0.50)

## 2022-04-11 NOTE — Progress Notes (Signed)
   Subjective:    Patient ID: Stefanie [name], female    DOB: [birth year], 36 y.o.   MRN: [id number]  HPI  Left arm soreness and redness, received prevnar 20 and flu shot on 04/05/22 at pharmacy.  Patient states that she received her vaccines in her left deltoid however now her left bicep hurts.  Patient states that her left bicep started hurting a few days after getting her vaccines.  Patient states that pain is manageable with ibuprofen however patient wants to be sure its not a clot.  Review of Systems  All other systems reviewed and are negative.      Objective:   Physical Exam Vitals reviewed.  Constitutional:      General: She is not in acute distress.    Appearance: Normal appearance. She is normal weight. She is not ill-appearing, toxic-appearing or diaphoretic.  HENT:     Head: Normocephalic and atraumatic.  Cardiovascular:     Rate and Rhythm: Normal rate and regular rhythm.     Pulses: Normal pulses.     Heart sounds: Normal heart sounds. No murmur heard. Pulmonary:     Effort: Pulmonary effort is normal. No respiratory distress.     Breath sounds: Normal breath sounds. No wheezing.  Musculoskeletal:     Comments: No redness noted to left deltoid or left bicep.  Left bicep has mild tenderness to palpation.  Skin:    General: Skin is warm.     Capillary Refill: Capillary refill takes less than 2 seconds.  Neurological:     Mental Status: She is alert.     Comments: Grossly intact  Psychiatric:        Mood and Affect: Mood normal.        Behavior: Behavior normal.           Assessment & Plan:   1. Left arm pain -Etiology of pain unknown likely musculoskeletal -However, due to patient's concern of blood clot we will get D-dimer - D-dimer, quantitative, stat -Encourage patient to use ibuprofen or Tylenol for pain management -If pain persist may consider short course of prednisone -Return to clinic if symptoms do not improve or worsen. -If D-dimer positive  will refer patient to emergency room    Note:  This document was prepared using Dragon voice recognition software and may include unintentional dictation errors. Note - This record has been created using [employer].  Chart creation errors have been sought, but may not always  have been located. Such creation errors do not reflect on  the standard of medical care.

## 2022-04-13 ENCOUNTER — Ambulatory Visit: Payer: No Typology Code available for payment source

## 2022-04-15 ENCOUNTER — Encounter: Payer: Self-pay | Admitting: Nurse Practitioner

## 2022-04-23 IMAGING — DX DG CHEST 2V
2 series · 2 of 2 positions shown · non-contrast
Comparison: Portable chest 09/09/2007.

CLINICAL DATA: 35-year-old female with recent influenza, pneumonia.
Smoker.

EXAM:
CHEST - 2 VIEW

[chest pa]
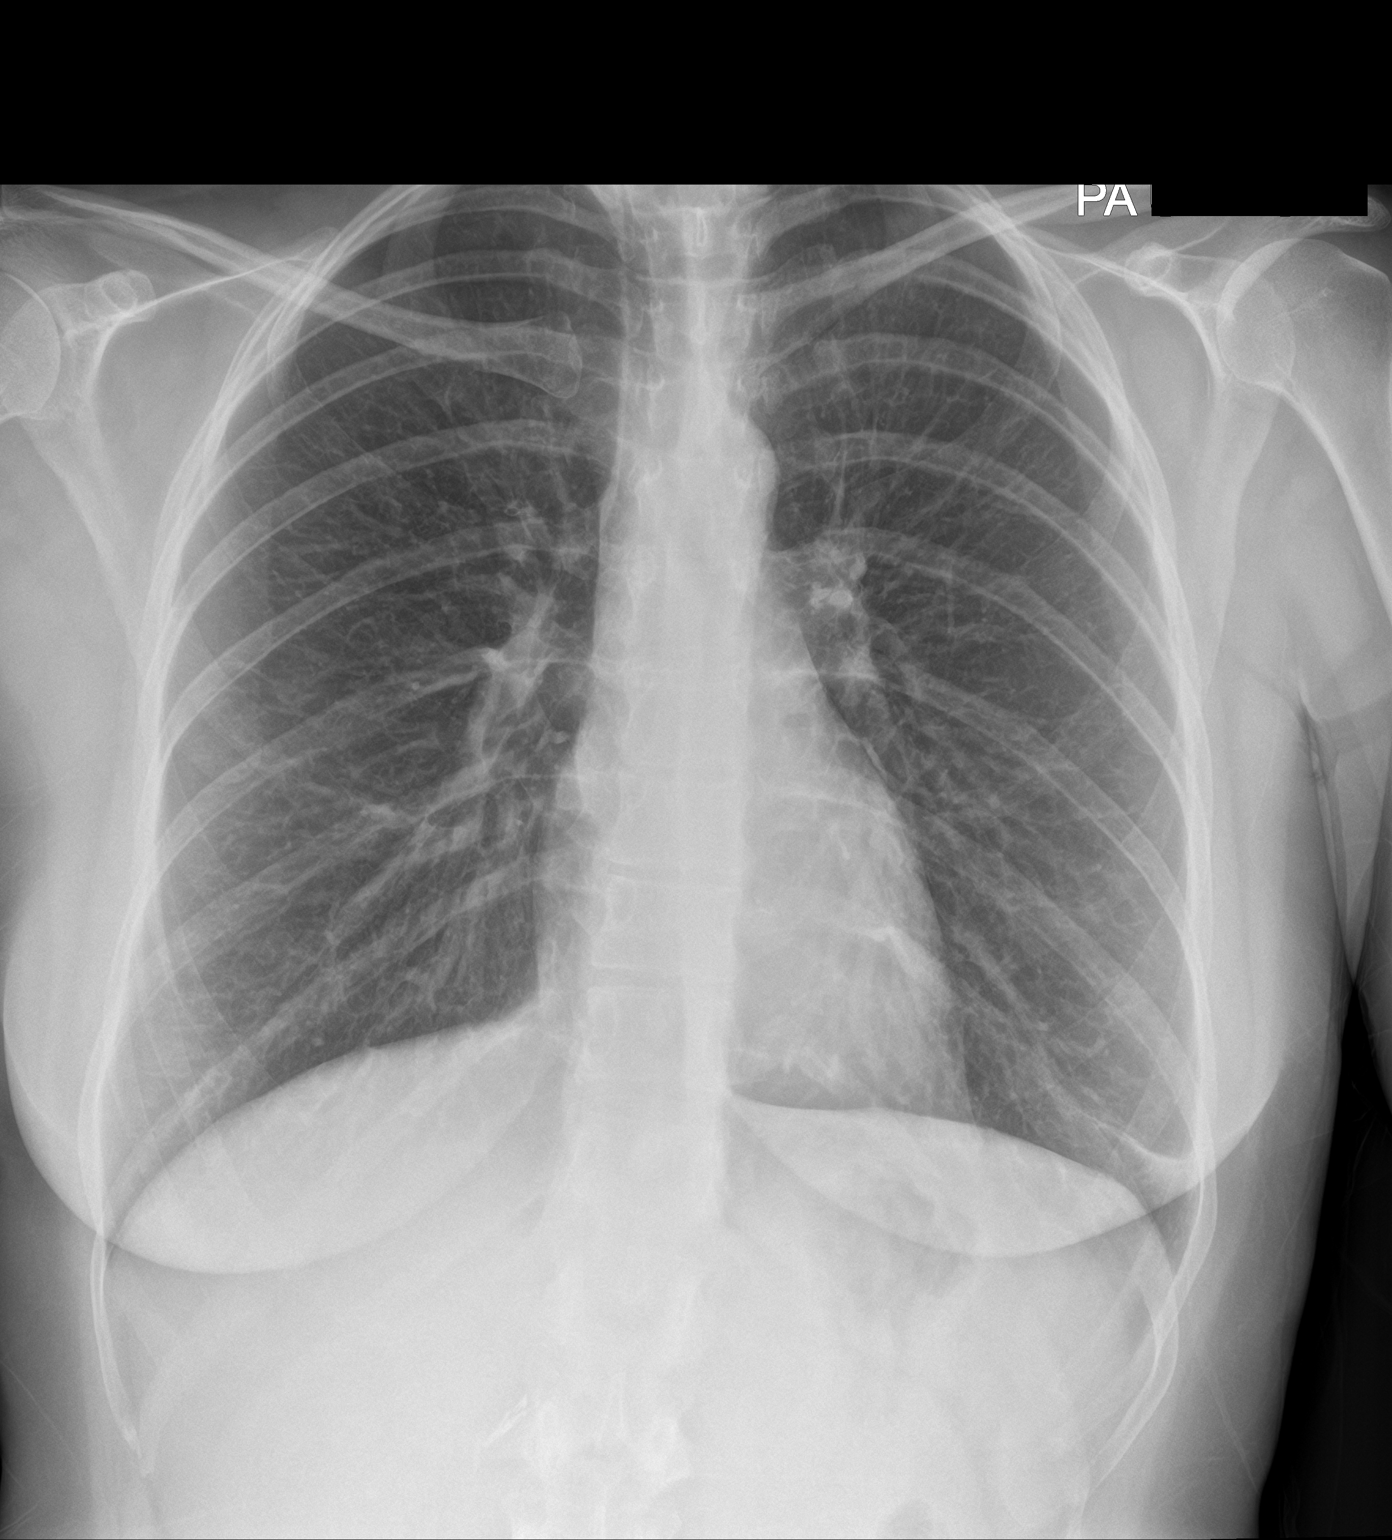

[chest lat]
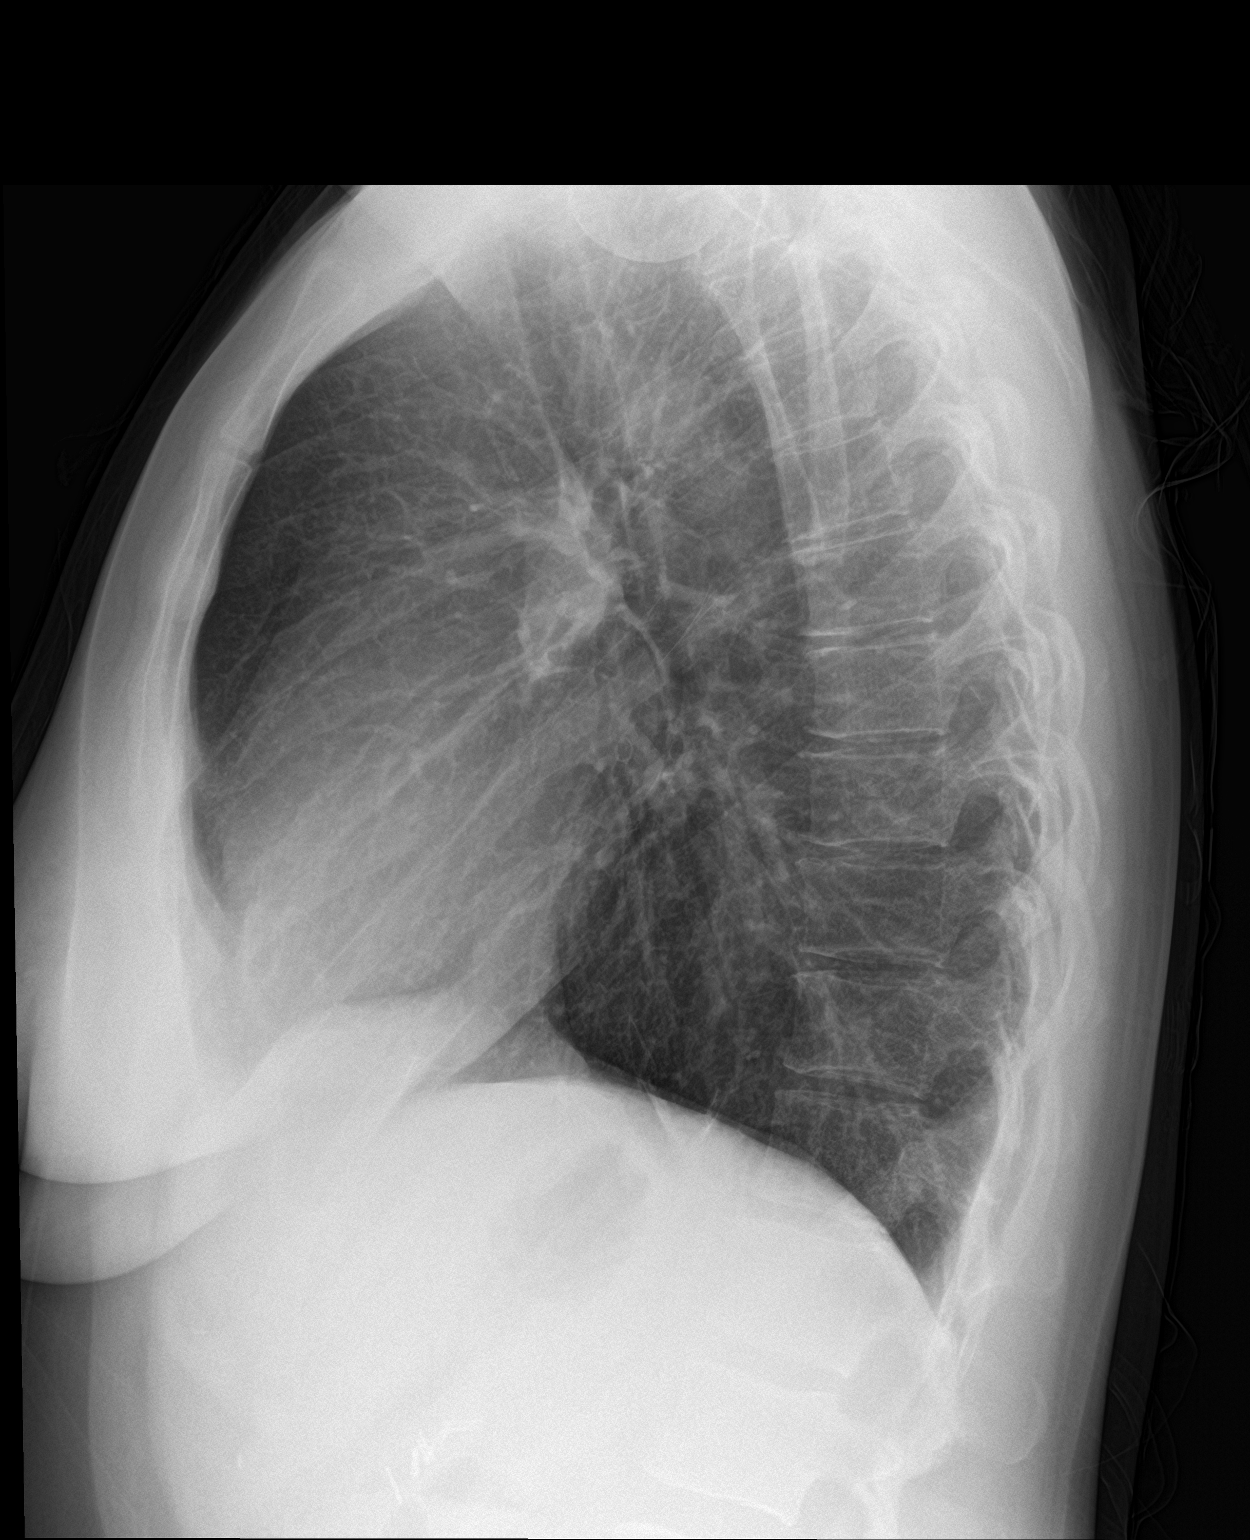

[2 of 2 positions shown; findings below may reference images not displayed]

FINDINGS: Lung volumes and mediastinal contours remain normal. Visualized
tracheal air column is within normal limits. Mild chronic increased
interstitial markings throughout both lungs. Mild new curvilinear
opacity at the left lung base since 4111 most resembles scarring. No
pleural effusion. No other confluent opacity. No acute osseous
abnormality identified. Cholecystectomy clips in the right upper
quadrant with paucity of bowel gas.
IMPRESSION: 1. Mild curvilinear opacity at the left lateral lung base more
resembles scarring than infection.
2. No other acute cardiopulmonary abnormality.

## 2022-06-05 ENCOUNTER — Telehealth: Payer: Self-pay | Admitting: Family Medicine

## 2022-06-05 ENCOUNTER — Other Ambulatory Visit: Payer: Self-pay | Admitting: Family Medicine

## 2022-06-05 MED ORDER — ONDANSETRON HCL 4 MG PO TABS: 4.0000 mg | ORAL_TABLET | Freq: Three times a day (TID) | ORAL | 0 refills | Status: DC | PRN

## 2022-06-05 MED ORDER — AMOXICILLIN-POT CLAVULANATE 875-125 MG PO TABS: 1.0000 | ORAL_TABLET | Freq: Two times a day (BID) | ORAL | 0 refills | Status: DC

## 2022-06-05 NOTE — Telephone Encounter (Signed)
Please advise. Thank you

## 2022-06-05 NOTE — Telephone Encounter (Signed)
Left message to return call 

## 2022-06-05 NOTE — Telephone Encounter (Signed)
Pt returned call and states that Urgent Care gave her Doxycycline. Please advise. Thank you  [facility]

## 2022-06-05 NOTE — Telephone Encounter (Signed)
Patient has head congested,cough,low fever for 3 days, nausea, diarrhea. [facility] times for Covid negative and flu  test negative,Woke up this morning burning in chest ,very weak . She was pit on steroids her first  and she went back on 12/17 and was put on antibiotics which made her feel worst dizziness, no appetite just sick and crying this morning. She just wants to feel better. [facility]-

## 2022-06-05 NOTE — Telephone Encounter (Signed)
Pt verbalized understanding.

## 2022-09-01 ENCOUNTER — Ambulatory Visit
Admission: RE | Admit: 2022-09-01 | Discharge: 2022-09-01 | Disposition: A | Discharge status: Home / Self Care | Payer: No Typology Code available for payment source | Source: Ambulatory Visit

## 2022-09-01 VITALS — BP 105/73 | HR 97 | Temp 98.3°F | Resp 18

## 2022-09-01 DIAGNOSIS — J0100 Acute maxillary sinusitis, unspecified: Secondary | ICD-10-CM | POA: Diagnosis not present

## 2022-09-01 MED ORDER — FLUTICASONE PROPIONATE 50 MCG/ACT NA SUSP: 2.0000 | Freq: Every day | NASAL | 0 refills | Status: DC

## 2022-09-01 MED ORDER — PSEUDOEPH-BROMPHEN-DM 30-2-10 MG/5ML PO SYRP: 5.0000 mL | ORAL_SOLUTION | Freq: Four times a day (QID) | ORAL | 0 refills | Status: DC | PRN

## 2022-09-01 MED ORDER — AMOXICILLIN-POT CLAVULANATE 875-125 MG PO TABS: 1.0000 | ORAL_TABLET | Freq: Two times a day (BID) | ORAL | 0 refills | Status: DC

## 2022-09-01 NOTE — Discharge Instructions (Addendum)
Take medication as directed. Increase fluids and get plenty of rest. May take over-the-counter ibuprofen or Tylenol as needed for pain, fever, or general discomfort. Recommend normal saline nasal spray to help with nasal congestion throughout the day. For your cough, it may be helpful to use a humidifier at bedtime during sleep. If your symptoms fail to improve with this treatment, please follow-up with your primary care physician for further evaluation.

## 2022-09-01 NOTE — ED Provider Notes (Signed)
[facility]    CSN: [id number] Arrival date & time: 09/01/22  1055      History   Chief Complaint Chief Complaint  Patient presents with   Cough    Sinus congestion and pressure. Chest congestion. - Entered by patient   Appointment    1100    HPI Stefanie [name] is a 37 y.o. female.   The history is provided by the patient.   The patient presents for complaints of sinus pressure, headache, nasal congestion, cough, and chest congestion.  Symptoms have been present for more than 1 week per the patient's report.  Patient reports that she thinks he had a low-grade fever when her symptoms started.  She denies ear pain, sore throat, wheezing, difficulty breathing, or GI symptoms.  Patient reports that her children have been sick with strep.  She states that she has been taking over-the-counter medications with Sudafed to help with her symptoms, but has not experienced any relief.  She orts that she thinks she may have allergies, but feels they are becoming worse.  She does not take any medication for her allergies at this time.  Past Medical History:  Diagnosis Date   Abnormal Pap smear of cervix    Discoid lupus 10/15/2018   Being followed by [facility] dermatology April 2020 Was seen by [facility] rheumatology they did not feel the patient has systemic lupus   Herpes 10/12/2013   HSV-2 (herpes simplex virus 2) infection    Hx of gonorrhea    Pregnant 06/23/2015    Patient Active Problem List   Diagnosis Date Noted   Encounter for surveillance of contraceptive pills 11/27/2021   PMS (premenstrual syndrome) 08/27/2021   Oral contraception initial prescription 08/27/2021   Pregnancy examination or test, negative result 08/27/2021   Encounter for gynecological examination with Papanicolaou smear of cervix 08/27/2021   Acute frontal sinusitis 08/16/2021   Discoid lupus 10/15/2018   Nephrolithiasis 12/31/2014   Herpes 10/12/2013    Past Surgical History:  Procedure  Laterality Date   CHOLECYSTECTOMY N/A 01/02/2015   Procedure: LAPAROSCOPIC CHOLECYSTECTOMY;  Surgeon: Aviva Signs, MD;  Location: [facility];  Service: General;  Laterality: N/A;   COLPOSCOPY     colpo with biopsy   WISDOM TOOTH EXTRACTION      OB History     Gravida  4   Para  3   Term  3   Preterm      AB  1   Living  3      SAB  1   IAB      Ectopic      Multiple  0   Live Births  3            Home Medications    Prior to Admission medications   Medication Sig Start Date End Date Taking? Authorizing Provider  amoxicillin-clavulanate (AUGMENTIN) 875-125 MG tablet Take 1 tablet by mouth every 12 (twelve) hours. 09/01/22  Yes Mirel Hundal-Warren, Alda Lea, NP  brompheniramine-pseudoephedrine-DM 30-2-10 MG/5ML syrup Take 5 mLs by mouth 4 (four) times daily as needed. 09/01/22  Yes Khori Underberg-Warren, Alda Lea, NP  fluticasone (FLONASE) 50 MCG/ACT nasal spray Place 2 sprays into both nostrils daily. 09/01/22  Yes Christion Leonhard-Warren, Alda Lea, NP  pseudoephedrine (SUDAFED) 120 MG 12 hr tablet Take 120 mg by mouth 2 (two) times daily.   Yes [provider]  acyclovir (ZOVIRAX) 400 MG tablet Take 1 tablet (400 mg total) by mouth 2 (two) times daily. 08/27/21  Derrek Monaco A, NP  clobetasol cream (TEMOVATE) 0.05 % Apply topically as needed. Patient not taking: Reported on 04/11/2022 01/11/20   [provider]  famotidine (PEPCID) 20 MG tablet Take 1 tablet (20 mg total) by mouth 2 (two) times daily. For two months, then twice daily as needed. Patient not taking: Reported on 04/11/2022 02/07/21   Mahala Menghini, PA-C  norethindrone (MICRONOR) 0.35 MG tablet Take 1 tablet (0.35 mg total) by mouth daily. 08/27/21   Estill Dooms, NP  ondansetron (ZOFRAN) 4 MG tablet Take 1 tablet (4 mg total) by mouth every 8 (eight) hours as needed for nausea or vomiting. Patient not taking: Reported on 04/11/2022 12/23/21   Elley Harp-Warren, Alda Lea, NP  ondansetron (ZOFRAN) 4 MG  tablet Take 1 tablet (4 mg total) by mouth every 8 (eight) hours as needed for nausea or vomiting. 06/05/22   Coral Spikes, DO    Family History Family History  Problem Relation Age of Onset   Heart attack Father    Heart disease Father    Dementia Maternal Grandmother    Cancer Maternal Grandfather        throat, lung, prostate cancer   Diabetes Paternal Grandmother    Heart attack Paternal Grandfather    Heart disease Paternal Grandfather    Diabetes Maternal Uncle    Colon cancer Neg Hx     Social History Social History   Tobacco Use   Smoking status: Every Day    Packs/day: 0.25    Years: 20.00    Additional pack years: 0.00    Total pack years: 5.00    Types: Cigarettes   Smokeless tobacco: Never  Vaping Use   Vaping Use: Never used  Substance Use Topics   Alcohol use: No   Drug use: No     Allergies   Patient has no known allergies.   Review of Systems Review of Systems Per HPI  Physical Exam Triage Vital Signs ED Triage Vitals  Enc Vitals Group     BP 09/01/22 1128 105/73     Pulse Rate 09/01/22 1128 97     Resp 09/01/22 1128 18     Temp 09/01/22 1128 98.3 F (36.8 C)     Temp Source 09/01/22 1128 Oral     SpO2 09/01/22 1128 97 %     Weight --      Height --      Head Circumference --      Peak Flow --      Pain Score 09/01/22 1129 2     Pain Loc --      Pain Edu? --      Excl. in [place]? --    No data found.  Updated Vital Signs BP 105/73 (BP Location: Right Arm)   Pulse 97   Temp 98.3 F (36.8 C) (Oral)   Resp 18   SpO2 97%   Visual Acuity Right Eye Distance:   Left Eye Distance:   Bilateral Distance:    Right Eye Near:   Left Eye Near:    Bilateral Near:     Physical Exam Vitals and nursing note reviewed.  Constitutional:      General: She is not in acute distress.    Appearance: Normal appearance.  HENT:     Head: Normocephalic.     Right Ear: Tympanic membrane, ear canal and external ear normal.     Left Ear:  Tympanic membrane, ear canal and external ear normal.  Nose: Congestion present. No rhinorrhea.     Right Turbinates: Enlarged and swollen.     Left Turbinates: Enlarged and swollen.     Right Sinus: Maxillary sinus tenderness present. No frontal sinus tenderness.     Left Sinus: Maxillary sinus tenderness present. No frontal sinus tenderness.     Mouth/Throat:     Lips: Pink.     Pharynx: Oropharynx is clear. Uvula midline. Posterior oropharyngeal erythema present. No pharyngeal swelling or oropharyngeal exudate.     Comments: Cobblestoning present on posterior oropharynx Eyes:     Extraocular Movements: Extraocular movements intact.     Conjunctiva/sclera: Conjunctivae normal.     Pupils: Pupils are equal, round, and reactive to light.  Cardiovascular:     Rate and Rhythm: Normal rate and regular rhythm.     Pulses: Normal pulses.     Heart sounds: Normal heart sounds.  Pulmonary:     Effort: Pulmonary effort is normal. No respiratory distress.     Breath sounds: Normal breath sounds. No stridor. No wheezing, rhonchi or rales.  Abdominal:     General: Bowel sounds are normal.     Palpations: Abdomen is soft.     Tenderness: There is no abdominal tenderness.  Musculoskeletal:     Cervical back: Normal range of motion.  Skin:    General: Skin is warm and dry.  Neurological:     General: No focal deficit present.     Mental Status: She is alert and oriented to person, place, and time.  Psychiatric:        Mood and Affect: Mood normal.        Behavior: Behavior normal.      UC Treatments / Results  Labs (all labs ordered are listed, but only abnormal results are displayed) Labs Reviewed - No data to display  EKG   Radiology No results found.  Procedures Procedures (including critical care time)  Medications Ordered in UC Medications - No data to display  Initial Impression / Assessment and Plan / UC Course  I have reviewed the triage vital signs and the  nursing notes.  Pertinent labs & imaging results that were available during my care of the patient were reviewed by me and considered in my medical decision making (see chart for details).  The patient is well-appearing, she is in no acute distress, vital signs are stable.  Patient with moderate maxillary sinus tenderness.  Symptoms have been present for more than 1 week, she has been taking over-the-counter medications with no relief.  Symptoms appear to be consistent with an acute maxillary sinusitis.  Will treat with Augmentin 875/125 mg for the next 7 days along with fluticasone 50 mcg nasal spray for nasal congestion.  Supportive care recommendations were discussed with the patient to include increasing fluids, allowing for plenty of rest, and normal saline nasal spray to help with nasal congestion.  Patient was given strict indications of when follow-up may be necessary.  Patient is in agreement with this plan of care and verbalized understanding.  All questions were answered.  Patient stable for discharge.   Final Clinical Impressions(s) / UC Diagnoses   Final diagnoses:  Acute maxillary sinusitis, recurrence not specified     Discharge Instructions      Take medication as directed. Increase fluids and get plenty of rest. May take over-the-counter ibuprofen or Tylenol as needed for pain, fever, or general discomfort. Recommend normal saline nasal spray to help with nasal congestion throughout the day. For your  cough, it may be helpful to use a humidifier at bedtime during sleep. If your symptoms fail to improve with this treatment, please follow-up with your primary care physician for further evaluation.     ED Prescriptions     Medication Sig Dispense Auth. Provider   amoxicillin-clavulanate (AUGMENTIN) 875-125 MG tablet Take 1 tablet by mouth every 12 (twelve) hours. 14 tablet Sherylann Vangorden-Warren, Alda Lea, NP   fluticasone (FLONASE) 50 MCG/ACT nasal spray Place 2 sprays into both  nostrils daily. 16 g Schneider Warchol-Warren, Alda Lea, NP   brompheniramine-pseudoephedrine-DM 30-2-10 MG/5ML syrup Take 5 mLs by mouth 4 (four) times daily as needed. 140 mL Jabri Blancett-Warren, Alda Lea, NP      PDMP not reviewed this encounter.   Tish Men, NP 09/01/22 1144

## 2022-09-01 NOTE — ED Triage Notes (Signed)
Pt reports cough, headache, chest congestion, sinus pressure x  1 week. Sudafed gives no relief. Pt concern for pneumonia. Reports her kids has Strep.

## 2022-09-16 ENCOUNTER — Other Ambulatory Visit: Payer: Self-pay | Admitting: Adult Health

## 2022-09-25 ENCOUNTER — Other Ambulatory Visit: Payer: Self-pay

## 2022-09-25 ENCOUNTER — Ambulatory Visit: Payer: No Typology Code available for payment source | Admitting: Allergy & Immunology

## 2022-09-25 ENCOUNTER — Encounter: Payer: Self-pay | Admitting: Allergy & Immunology

## 2022-09-25 VITALS — BP 100/68 | HR 96 | Temp 97.8°F | Resp 16 | Ht 69.29 in | Wt 205.5 lb

## 2022-09-25 DIAGNOSIS — L563 Solar urticaria: Secondary | ICD-10-CM

## 2022-09-25 DIAGNOSIS — R09A2 Foreign body sensation, throat: Secondary | ICD-10-CM | POA: Diagnosis not present

## 2022-09-25 DIAGNOSIS — J310 Chronic rhinitis: Secondary | ICD-10-CM

## 2022-09-25 MED ORDER — TRIAMCINOLONE ACETONIDE 0.1 % EX CREA: 1.0000 | TOPICAL_CREAM | Freq: Two times a day (BID) | CUTANEOUS | 0 refills | Status: AC | PRN

## 2022-09-25 NOTE — Patient Instructions (Addendum)
1. Chronic rhinitis - Because of your insurance, we are going to skin test you at the next visit.  - Continue with the Flonase up to TWICE DAILY on the worst days. - Samples of Allegra provided to see if that helps. - Many people like to alternate antihistamines every month or so to keep them effective.  - We will do testing at the next visit.   2. Solar urticaria - Try doubling the antihistamines when you have the hives.  - Continue with triamcinolone on the lesions to see if that helps.   3. Globus sensation - I do not know what to think of this!   4. Return in about 4 weeks (around 10/23/2022). You can have the follow up appointment with Dr. Dellis Anes or a Nurse Practicioner (our Nurse Practitioners are excellent and always have Physician oversight!).    Please inform us of any Emergency Department visits, hospitalizations, or changes in symptoms. Call us before going to the ED for breathing or allergy symptoms since we might be able to fit you in for a sick visit. Feel free to contact us anytime with any questions, problems, or concerns.  It was a pleasure to see you again today!  Websites that have reliable patient information: 1. American Academy of Asthma, Allergy, and Immunology: www.aaaai.org 2. Food Allergy Research and Education (FARE): foodallergy.org 3. Mothers of Asthmatics: http://www.asthmacommunitynetwork.org 4. American College of Allergy, Asthma, and Immunology: www.acaai.org   COVID-19 Vaccine Information can be found at: [email or web address] For questions related to vaccine distribution or appointments, please email [email or web address] or call [phone].   We realize that you might be concerned about having an allergic reaction to the COVID19 vaccines. To help with that concern, WE ARE OFFERING THE COVID19 VACCINES IN OUR OFFICE! Ask the front desk for dates!     "Like" [place] on Facebook and Instagram for  our latest updates!      A healthy democracy works best when [employer] participate! Make sure you are registered to vote! If you have moved or changed any of your contact information, you will need to get this updated before voting!  In some cases, you MAY be able to register to vote online: [email or web address]

## 2022-09-25 NOTE — Progress Notes (Unsigned)
NEW PATIENT  Date of Service/Encounter:  09/25/22  Consult requested by: Stefanie [name], Jayce G, DO   Assessment:   Chronic rhinitis - will do testing at the next visit   Solar urticaria  Globus sensation - s/p normal rhinoscopy  Plan/Recommendations:   1. Chronic rhinitis - Because of your insurance, we are going to skin test you at the next visit.  - Continue with the Flonase up to TWICE DAILY on the worst days. - Samples of Allegra provided to see if that helps. - Many people like to alternate antihistamines every month or so to keep them effective.  - We will do testing at the next visit.   2. Solar urticaria - Try doubling the antihistamines when you have the hives.  - Continue with triamcinolone on the lesions to see if that helps.   3. Globus sensation - I do not know what to think of this!  - Rhinoscopy has been performed in the past and was within normal limits.   4. Return in about 4 weeks (around 10/23/2022). You can have the follow up appointment with Dr. Dellis AnesGallagher or a Nurse Practicioner (our Nurse Practitioners are excellent and always have Physician oversight!).   This note in its entirety was forwarded to the Provider who requested this consultation.  Subjective:   Stefanie [name] is a 37 y.o. female presenting today for evaluation of  Chief Complaint  Patient presents with   Allergic Rhinitis    Urticaria    Allergy to Sun?    Stefanie [name] has a history of the following: Patient Active Problem List   Diagnosis Date Noted   Encounter for surveillance of contraceptive pills 11/27/2021   PMS (premenstrual syndrome) 08/27/2021   Oral contraception initial prescription 08/27/2021   Pregnancy examination or test, negative result 08/27/2021   Encounter for gynecological examination with Papanicolaou smear of cervix 08/27/2021   Acute frontal sinusitis 08/16/2021   Discoid lupus 10/15/2018   Nephrolithiasis 12/31/2014   Herpes 10/12/2013    History  obtained from: chart review and patient.  Stefanie SeltzerKrystal L Arbuthnot was referred by Shona Simpsonook, Jayce G, DO.     Stefanie [name] is a 37 y.o. female presenting for an evaluation of multiple atopic complaints .   Allergic Rhinitis Symptom History: She reports allergic rhinitis symptoms in the spring and the fall. She reports that she has eye burning when she goes outside. She has some tingling sensation of her lips when she is outdoors. She does not have this with ingestion of certain items at all. She is not aware of any foods that are causing her symptoms.  She is taking cetirizine every morning and this has been working somewhat. She did have a sinus infection a few weeks ago. She had a lot of sinus pain and pressure with discharge. It started as allergies and then go to the point where it hurt. She was placed on antibiotics with improvement in her symptoms. She has had prednisone for her symptoms which does provide relief. She feels crazy with it and will not take it if given the chance. She does have a Flonase which she uses as needed. She gets sinus infections 1-2 times per years. The daily cetrizine has helped a lot. She has forogtten to take her cetirizine and she can tell within 12 hours or so.  She has had PNA on a couple of occasions. The last time before that was December 2022 which was secondary to contracting the flu. She did have a  globus sensation in June 2022. She was sent to an ENT doctor who did a rhinoscopy. Nothing was seen during that evaluation. She thinks this might be related to allergies. She started having a sensation that there was a piece of hair or something in her throat. This has not happened again. She was tried on gabapentin because it was concern that this was a nerve related issue. But she never took it. It comes on 2-3 times per year. It lasts for one week at a time and resolves. It started after the first time that she had COVID19.   Skin Symptom History: She reports that she has hives. She was  trying to tan before she got married which is why she was in the middle of tanning. She has no problem with tanning at the beach. But then she would breakout at the tanning bed. She does breakout now when she is at the beach and it takes weeks to go away. She does use sun block, but it still takes weeks to go away.   She does have some urticaria on her arms even today from going outside without a sleeveless sure. She has tried a topical steroid on the rash.   She does tell me that in 2019 into the 2020, she woke up and thought that she has chicken pox. She went to see Dermtaology and they do not know exactly what it was. She dealt with this for one year or so. She reports that she would have a sore and then it would heal and repeat after that. She thinks that it was diagnosed as lichen "something". She was on doxycycline for one year and it finally cleared up. She did have two biopsies performed. She does have intermittent lesions. She does not remember where she went to Dermatology.   Infectious history is largely unremarkable. She has had some UTIs and a sinus infection or two. Otherwise she has not had a problem with recurrent infections.   Otherwise, there is no history of other atopic diseases, including asthma, food allergies, drug allergies, environmental allergies, stinging insect allergies, eczema, urticaria, or contact dermatitis. There is no significant infectious history. Vaccinations are up to date.    Past Medical History: Patient Active Problem List   Diagnosis Date Noted   Encounter for surveillance of contraceptive pills 11/27/2021   PMS (premenstrual syndrome) 08/27/2021   Oral contraception initial prescription 08/27/2021   Pregnancy examination or test, negative result 08/27/2021   Encounter for gynecological examination with Papanicolaou smear of cervix 08/27/2021   Acute frontal sinusitis 08/16/2021   Discoid lupus 10/15/2018   Nephrolithiasis 12/31/2014   Herpes 10/12/2013     Medication List:  Allergies as of 09/25/2022   No Known Allergies      Medication List        Accurate as of September 25, 2022  9:48 PM. If you have any questions, ask your nurse or doctor.          acyclovir 400 MG tablet Commonly known as: ZOVIRAX Take 1 tablet (400 mg total) by mouth 2 (two) times daily.   amoxicillin-clavulanate 875-125 MG tablet Commonly known as: AUGMENTIN Take 1 tablet by mouth every 12 (twelve) hours.   brompheniramine-pseudoephedrine-DM 30-2-10 MG/5ML syrup Take 5 mLs by mouth 4 (four) times daily as needed.   clobetasol cream 0.05 % Commonly known as: TEMOVATE Apply topically as needed.   famotidine 20 MG tablet Commonly known as: Pepcid Take 1 tablet (20 mg total) by mouth 2 (  two) times daily. For two months, then twice daily as needed.   fluticasone 50 MCG/ACT nasal spray Commonly known as: FLONASE Place 2 sprays into both nostrils daily.   norethindrone 0.35 MG tablet Commonly known as: MICRONOR Take 1 tablet by mouth once daily   ondansetron 4 MG tablet Commonly known as: Zofran Take 1 tablet (4 mg total) by mouth every 8 (eight) hours as needed for nausea or vomiting.   ondansetron 4 MG tablet Commonly known as: Zofran Take 1 tablet (4 mg total) by mouth every 8 (eight) hours as needed for nausea or vomiting.   pseudoephedrine 120 MG 12 hr tablet Commonly known as: SUDAFED Take 120 mg by mouth 2 (two) times daily.   triamcinolone cream 0.1 % Commonly known as: KENALOG Apply 1 Application topically 2 (two) times daily as needed. Started by: Alfonse Spruce, MD   ZYRTEC PO Take by mouth.        Birth History: born at term without complications  Developmental History: non-contributory  Past Surgical History: Past Surgical History:  Procedure Laterality Date   CHOLECYSTECTOMY N/A 01/02/2015   Procedure: LAPAROSCOPIC CHOLECYSTECTOMY;  Surgeon: Franky Macho, MD;  Location: [facility];  Service: General;   Laterality: N/A;   COLPOSCOPY     colpo with biopsy   WISDOM TOOTH EXTRACTION       Family History: Family History  Problem Relation Age of Onset   Heart attack Father    Heart disease Father    Dementia Maternal Grandmother    Cancer Maternal Grandfather        throat, lung, prostate cancer   Diabetes Paternal Grandmother    Heart attack Paternal Grandfather    Heart disease Paternal Grandfather    Diabetes Maternal Uncle    Colon cancer Neg Hx      Social History: Stefanie [name] lives at home with her family.  She was a house that is 39 years old.  There is wood vinyl throughout the main living areas and carpeting in the bedroom.  She has electric heating and central cooling.  There is a dog inside of the home.  There are dust mite covers on the bed as well as the pillows.  There is no tobacco exposure.  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  She works from home for [employer].  She is not exposed to fumes, chemicals, or dust.  There is no HEPA filter in the home.  She does not live near an interstate or industrial area.   Review of Systems  Constitutional:  Positive for chills. Negative for fever, malaise/fatigue and weight loss.  HENT:  Positive for congestion. Negative for ear discharge and ear pain.        Positive for rhinorrhea.   Eyes:  Negative for pain, discharge and redness.  Respiratory:  Negative for cough, sputum production, shortness of breath and wheezing.   Cardiovascular: Negative.  Negative for chest pain and palpitations.  Gastrointestinal:  Negative for abdominal pain, heartburn, nausea and vomiting.  Skin:  Positive for itching and rash.  Neurological:  Negative for dizziness and headaches.  Endo/Heme/Allergies:  Positive for environmental allergies. Does not bruise/bleed easily.       Objective:   Blood pressure 100/68, pulse 96, temperature 97.8 F (36.6 C), resp. rate 16, height 5' 9.29" (1.76 m), weight 205 lb 8 oz  (93.2 kg), SpO2 98 %. Body mass index is 30.09 kg/m.     Physical Exam Vitals reviewed.  Constitutional:      Appearance: She is well-developed.  HENT:     Head: Normocephalic and atraumatic.     Right Ear: Tympanic membrane, ear canal and external ear normal. No drainage, swelling or tenderness. Tympanic membrane is not injected, scarred, erythematous, retracted or bulging.     Left Ear: Tympanic membrane, ear canal and external ear normal. No drainage, swelling or tenderness. Tympanic membrane is not injected, scarred, erythematous, retracted or bulging.     Nose: No nasal deformity, septal deviation, mucosal edema or rhinorrhea.     Right Turbinates: Enlarged, swollen and pale.     Left Turbinates: Enlarged, swollen and pale.     Right Sinus: No maxillary sinus tenderness or frontal sinus tenderness.     Left Sinus: No maxillary sinus tenderness or frontal sinus tenderness.     Mouth/Throat:     Mouth: Mucous membranes are not pale and not dry.     Pharynx: Uvula midline.  Eyes:     General:        Right eye: No discharge.        Left eye: No discharge.     Conjunctiva/sclera: Conjunctivae normal.     Right eye: Right conjunctiva is not injected. No chemosis.    Left eye: Left conjunctiva is not injected. No chemosis.    Pupils: Pupils are equal, round, and reactive to light.  Cardiovascular:     Rate and Rhythm: Normal rate and regular rhythm.     Heart sounds: Normal heart sounds.  Pulmonary:     Effort: Pulmonary effort is normal. No tachypnea, accessory muscle usage or respiratory distress.     Breath sounds: Normal breath sounds. No wheezing, rhonchi or rales.  Chest:     Chest wall: No tenderness.  Abdominal:     Tenderness: There is no abdominal tenderness. There is no guarding or rebound.  Lymphadenopathy:     Head:     Right side of head: No submandibular, tonsillar or occipital adenopathy.     Left side of head: No submandibular, tonsillar or occipital  adenopathy.     Cervical: No cervical adenopathy.  Skin:    General: Skin is warm.     Capillary Refill: Capillary refill takes less than 2 seconds.     Coloration: Skin is not pale.     Findings: No abrasion, erythema, petechiae or rash. Rash is not papular, urticarial or vesicular.     Comments: She does have some urticaria over her bilateral arms. There are some excoriations present.   Neurological:     Mental Status: She is alert.      Diagnostic studies:  none (testing will be done at a future visit)     Malachi Bonds, MD [facility]

## 2022-10-10 ENCOUNTER — Other Ambulatory Visit: Payer: Self-pay | Admitting: Adult Health

## 2022-10-10 MED ORDER — ACYCLOVIR 400 MG PO TABS: 400.0000 mg | ORAL_TABLET | Freq: Two times a day (BID) | ORAL | 11 refills | Status: DC

## 2022-10-10 NOTE — Progress Notes (Signed)
Refilled acyclovir.

## 2022-10-22 NOTE — Patient Instructions (Signed)
Allergic rhinitis Your skin testing Continue a daily antihistamine.Remember to rotate to a different antihistamine about every 3 months. Some examples of over the counter antihistamines include Zyrtec (cetirizine), Xyzal (levocetirizine), Allegra (fexofenadine), and Claritin (loratidine).  Continue Flonase 2 sprays in each nostril once a day as needed for nasal congestion.  He may use Flonase 2 sprays in each nostril twice a day on the worst days. Consider saline nasal rinses as needed for nasal symptoms. Use this before any medicated nasal sprays for best result Consider allergen immunotherapy if your symptoms are not well-controlled with the treatment plan as listed above  Solar urticaria Continue an antihistamine once a day as needed for hives or itch.  You may take an additional antihistamine once a day for breakthrough symptoms. If your symptoms re-occur, begin a journal of events that occurred for up to 6 hours before your symptoms began including foods and beverages consumed, soaps or perfumes you had contact with, and medications.   Globus sensation Reflux????  Call the clinic if this treatment plan is not working well for you.  Follow up in *** or sooner if needed.

## 2022-10-22 NOTE — Progress Notes (Signed)
   [address] [place] [address] Dept: [phone]  FOLLOW UP NOTE  Patient ID: Stefanie [name], female    DOB: 09/29/1985  Age: 37 y.o. MRN: [id number] Date of Office Visit: 10/23/2022  Assessment  Chief Complaint: Allergic Rhinitis  (Worsening symptoms due to pollen - lip numbness ) and Other (Breakouts due to sun exposure )  HPI Stefanie [name] is a 37 year old female who presents to the clinic for follow-up visit.  She was last seen in this clinic on 09/25/2022 by Dr. Dellis Anes as a new patient for evaluation of chronic rhinitis, solar urticaria, and globus sensation that occurs 2-3 times a year and resolved after about 1 week.  Unfortunately, her insurance does not allow her to participate in skin testing at the first visit and she returns today for follow-up visit and allergy skin testing.  Patient reports that she took cetirizine this morning. She will reschedule for environmental allergy skin testing.   Drug Allergies:  No Known Allergies  Physical Exam: BP 124/84   Pulse 86   Temp 98.1 F (36.7 C)   Resp 18   Ht 5\' 9"  (1.753 m)   Wt 206 lb 9.6 oz (93.7 kg)   SpO2 98%   BMI 30.51 kg/m    Physical Exam  Diagnostics:    Assessment and Plan: 1. Chronic rhinitis     Patient Instructions  Allergic rhinitis Reschedule your environmental allergy skin testing appointment. Remember to stop antihistamines for 3 days before the testing appointment.  Follow up in 1 week or sooner if needed.  No follow-ups on file.    Thank you for the opportunity to care for this patient.  Please do not hesitate to contact me with questions.  Thermon Leyland, FNP [facility]

## 2022-10-23 ENCOUNTER — Other Ambulatory Visit: Payer: Self-pay

## 2022-10-23 ENCOUNTER — Ambulatory Visit: Payer: No Typology Code available for payment source | Admitting: Family Medicine

## 2022-10-23 ENCOUNTER — Encounter: Payer: Self-pay | Admitting: Family Medicine

## 2022-10-23 VITALS — BP 124/84 | HR 86 | Temp 98.1°F | Resp 18 | Ht 69.0 in | Wt 206.6 lb

## 2022-10-23 DIAGNOSIS — J310 Chronic rhinitis: Secondary | ICD-10-CM

## 2022-10-29 NOTE — Patient Instructions (Incomplete)
1. allergic rhinitis - your skin prick testing today was negative. Intradermal testing today is positive to grass mix, tree mix, major mold mix one and major mold mix 3 -start avoidance measures as below - Continue with the Flonase up to TWICE DAILY on the worst days. - continue Allegra as previously taking - Many people like to alternate antihistamines every month or so to keep them effective.  -  Consider allergy injections as a means of long-term control. - Allergy injections "re-train" and "reset" the immune system to ignore environmental allergens and decrease the resulting immune response to those allergens (sneezing, itchy watery eyes, runny nose, nasal congestion, etc).    - Allergy injections improve symptoms in 75-85% of patients.   - We can discuss this more at the next appointment if the medications are not working for you.    2. Solar urticaria - Try doubling the antihistamines when you have the hives.  - Continue with triamcinolone on the lesions to see if that helps.  -Consider Xolair if this continues to be an issue  3. Globus sensation - normal  rhinoscopy in the past  4. Schedule a follow up appointment in 4 weeks or sooner if needed   Reducing Pollen Exposure The American Academy of Allergy, Asthma and Immunology suggests the following steps to reduce your exposure to pollen during allergy seasons. Do not hang sheets or clothing out to dry; pollen may collect on these items. Do not mow lawns or spend time around freshly cut grass; mowing stirs up pollen. Keep windows closed at night.  Keep car windows closed while driving. Minimize morning activities outdoors, a time when pollen counts are usually at their highest. Stay indoors as much as possible when pollen counts or humidity is high and on windy days when pollen tends to remain in the air longer. Use air conditioning when possible.  Many air conditioners have filters that trap the pollen spores. Use a HEPA room  air filter to remove pollen form the indoor air you breathe.  Control of Mold Allergen Mold and fungi can grow on a variety of surfaces provided certain temperature and moisture conditions exist.  Outdoor molds grow on plants, decaying vegetation and soil.  The major outdoor mold, Alternaria and Cladosporium, are found in very high numbers during hot and dry conditions.  Generally, a late Summer - Fall peak is seen for common outdoor fungal spores.  Rain will temporarily lower outdoor mold spore count, but counts rise rapidly when the rainy period ends.  The most important indoor molds are Aspergillus and Penicillium.  Dark, humid and poorly ventilated basements are ideal sites for mold growth.  The next most common sites of mold growth are the bathroom and the kitchen.  Outdoor [employer] Use air conditioning and keep windows closed Avoid exposure to decaying vegetation. Avoid leaf raking. Avoid grain handling. Consider wearing a face mask if working in moldy areas.  Indoor Mold Control Maintain humidity below 50%. Clean washable surfaces with 5% bleach solution. Remove sources e.g. Contaminated carpets.

## 2022-10-30 ENCOUNTER — Ambulatory Visit (INDEPENDENT_AMBULATORY_CARE_PROVIDER_SITE_OTHER): Payer: No Typology Code available for payment source | Admitting: Family

## 2022-10-30 DIAGNOSIS — J302 Other seasonal allergic rhinitis: Secondary | ICD-10-CM

## 2022-10-30 DIAGNOSIS — J3089 Other allergic rhinitis: Secondary | ICD-10-CM

## 2022-10-30 DIAGNOSIS — R09A2 Foreign body sensation, throat: Secondary | ICD-10-CM

## 2022-10-30 DIAGNOSIS — L563 Solar urticaria: Secondary | ICD-10-CM

## 2022-10-30 MED ORDER — FLUTICASONE PROPIONATE 50 MCG/ACT NA SUSP: 2.0000 | Freq: Every day | NASAL | 5 refills | Status: DC

## 2022-10-30 NOTE — Progress Notes (Signed)
Date of Service/Encounter:  10/30/22  Allergy testing appointment   Initial visit on September 25, 2022, seen for chronic rhinitis, solar urticaria, and globus sensation.  Please see that note for additional details.  Today reports for allergy diagnostic testing:    DIAGNOSTICS:  Skin Testing: Environmental allergy panel. Adequate positive and negative controls Results discussed with patient/family.   Allergy testing results were read and interpreted by myself, documented by clinical staff.  Patient provided with copy of allergy testing along with avoidance measures when indicated.   1. allergic rhinitis - your skin prick testing today was negative. Intradermal testing today is positive to grass mix, tree mix, major mold mix one and major mold mix 3 -start avoidance measures as below - Continue with the Flonase up to TWICE DAILY on the worst days. - continue Allegra as previously taking - Many people like to alternate antihistamines every month or so to keep them effective.  -  Consider allergy injections as a means of long-term control. - Allergy injections "re-train" and "reset" the immune system to ignore environmental allergens and decrease the resulting immune response to those allergens (sneezing, itchy watery eyes, runny nose, nasal congestion, etc).    - Allergy injections improve symptoms in 75-85% of patients.   - We can discuss this more at the next appointment if the medications are not working for you.    2. Solar urticaria - Try doubling the antihistamines when you have the hives.  - Continue with triamcinolone on the lesions to see if that helps.  -Consider Xolair if this continues to be an issue  3. Globus sensation - normal  rhinoscopy in the past  4. Schedule a follow up appointment in 4 weeks or sooner if needed   Reducing Pollen Exposure The American Academy of Allergy, Asthma and Immunology suggests the following steps to reduce your exposure to pollen  during allergy seasons. Do not hang sheets or clothing out to dry; pollen may collect on these items. Do not mow lawns or spend time around freshly cut grass; mowing stirs up pollen. Keep windows closed at night.  Keep car windows closed while driving. Minimize morning activities outdoors, a time when pollen counts are usually at their highest. Stay indoors as much as possible when pollen counts or humidity is high and on windy days when pollen tends to remain in the air longer. Use air conditioning when possible.  Many air conditioners have filters that trap the pollen spores. Use a HEPA room air filter to remove pollen form the indoor air you breathe.  Control of Mold Allergen Mold and fungi can grow on a variety of surfaces provided certain temperature and moisture conditions exist.  Outdoor molds grow on plants, decaying vegetation and soil.  The major outdoor mold, Alternaria and Cladosporium, are found in very high numbers during hot and dry conditions.  Generally, a late Summer - Fall peak is seen for common outdoor fungal spores.  Rain will temporarily lower outdoor mold spore count, but counts rise rapidly when the rainy period ends.  The most important indoor molds are Aspergillus and Penicillium.  Dark, humid and poorly ventilated basements are ideal sites for mold growth.  The next most common sites of mold growth are the bathroom and the kitchen.  Outdoor [employer] Use air conditioning and keep windows closed Avoid exposure to decaying vegetation. Avoid leaf raking. Avoid grain handling. Consider wearing a face mask if working in moldy areas.  Indoor Mold Control Maintain humidity below  50%. Clean washable surfaces with 5% bleach solution. Remove sources e.g. Contaminated carpets. Nehemiah Settle, FNP [facility]

## 2022-10-31 ENCOUNTER — Encounter: Payer: Self-pay | Admitting: Family

## 2022-11-27 ENCOUNTER — Ambulatory Visit: Payer: No Typology Code available for payment source | Admitting: Allergy & Immunology

## 2022-11-27 ENCOUNTER — Ambulatory Visit
Admission: RE | Admit: 2022-11-27 | Discharge: 2022-11-27 | Disposition: A | Discharge status: Home / Self Care | Payer: No Typology Code available for payment source | Source: Ambulatory Visit | Attending: Nurse Practitioner | Admitting: Nurse Practitioner

## 2022-11-27 ENCOUNTER — Other Ambulatory Visit: Payer: Self-pay

## 2022-11-27 VITALS — BP 121/87 | HR 90 | Temp 97.6°F | Resp 20

## 2022-11-27 DIAGNOSIS — N3001 Acute cystitis with hematuria: Secondary | ICD-10-CM | POA: Diagnosis not present

## 2022-11-27 LAB — POCT URINALYSIS DIP (MANUAL ENTRY)
Bilirubin, UA: NEGATIVE
Glucose, UA: NEGATIVE mg/dL
Nitrite, UA: NEGATIVE
Protein Ur, POC: NEGATIVE mg/dL
Spec Grav, UA: 1.025 (ref 1.010–1.025)
Urobilinogen, UA: 0.2 E.U./dL
pH, UA: 6 (ref 5.0–8.0)

## 2022-11-27 MED ORDER — FLUCONAZOLE 150 MG PO TABS: 150.0000 mg | ORAL_TABLET | Freq: Once | ORAL | 0 refills | Status: AC

## 2022-11-27 MED ORDER — NITROFURANTOIN MONOHYD MACRO 100 MG PO CAPS: 100.0000 mg | ORAL_CAPSULE | Freq: Two times a day (BID) | ORAL | 0 refills | Status: AC

## 2022-11-27 NOTE — ED Provider Notes (Signed)
[facility]    CSN: [id number] Arrival date & time: 11/27/22  1542      History   Chief Complaint Chief Complaint  Patient presents with   URI    Entered by patient    HPI Stefanie [name] is a 37 y.o. female.   Patient presents today for 1 day history of "twinge" when urinating, urinary frequency and urgency, foul urinary odor, cloudy urine, and low back pain across her entire low back.  She denies voiding smaller amounts, new urinary incontinence, hematuria, abdominal pain, suprapubic pain or pressure, flank pain, fever, nausea/vomiting, and vaginal discharge.  Has not taken anything for symptoms so far.  Reports history of UTI, last was approximately 1 year ago.  Denies antibiotic use in the past 90 days.  No concern for STI today.     Past Medical History:  Diagnosis Date   Abnormal Pap smear of cervix    Discoid lupus 10/15/2018   Being followed by [facility] dermatology April 2020 Was seen by [facility] rheumatology they did not feel the patient has systemic lupus   Herpes 10/12/2013   HSV-2 (herpes simplex virus 2) infection    Hx of gonorrhea    Pregnant 06/23/2015    Patient Active Problem List   Diagnosis Date Noted   Encounter for surveillance of contraceptive pills 11/27/2021   PMS (premenstrual syndrome) 08/27/2021   Oral contraception initial prescription 08/27/2021   Pregnancy examination or test, negative result 08/27/2021   Encounter for gynecological examination with Papanicolaou smear of cervix 08/27/2021   Acute frontal sinusitis 08/16/2021   Discoid lupus 10/15/2018   Nephrolithiasis 12/31/2014   Herpes 10/12/2013    Past Surgical History:  Procedure Laterality Date   CHOLECYSTECTOMY N/A 01/02/2015   Procedure: LAPAROSCOPIC CHOLECYSTECTOMY;  Surgeon: Franky Macho, MD;  Location: [facility];  Service: General;  Laterality: N/A;   COLPOSCOPY     colpo with biopsy   WISDOM TOOTH EXTRACTION      OB History     Gravida  4   Para  3    Term  3   Preterm      AB  1   Living  3      SAB  1   IAB      Ectopic      Multiple  0   Live Births  3            Home Medications    Prior to Admission medications   Medication Sig Start Date End Date Taking? Authorizing Provider  fluconazole (DIFLUCAN) 150 MG tablet Take 1 tablet (150 mg total) by mouth once for 1 dose. 11/27/22 11/27/22 Yes Valentino Nose, NP  nitrofurantoin, macrocrystal-monohydrate, (MACROBID) 100 MG capsule Take 1 capsule (100 mg total) by mouth 2 (two) times daily for 5 days. 11/27/22 12/02/22 Yes Valentino Nose, NP  acyclovir (ZOVIRAX) 400 MG tablet Take 1 tablet (400 mg total) by mouth 2 (two) times daily. 10/10/22   Adline Potter, NP  Cetirizine HCl (ZYRTEC PO) Take by mouth.    [provider]  famotidine (PEPCID) 20 MG tablet Take 1 tablet (20 mg total) by mouth 2 (two) times daily. For two months, then twice daily as needed. 02/07/21   Tiffany Kocher, PA-C  fluticasone (FLONASE) 50 MCG/ACT nasal spray Place 2 sprays into both nostrils daily. 10/30/22   Nehemiah Settle, FNP  norethindrone [facility]) 0.35 MG tablet Take 1 tablet by mouth once daily 09/16/22   Valentina Lucks,  Ginette Pitman, NP  triamcinolone cream (KENALOG) 0.1 % Apply 1 Application topically 2 (two) times daily as needed. 09/25/22   Alfonse Spruce, MD    Family History Family History  Problem Relation Age of Onset   Heart attack Father    Heart disease Father    Dementia Maternal Grandmother    Cancer Maternal Grandfather        throat, lung, prostate cancer   Diabetes Paternal Grandmother    Heart attack Paternal Grandfather    Heart disease Paternal Grandfather    Diabetes Maternal Uncle    Colon cancer Neg Hx     Social History Social History   Tobacco Use   Smoking status: Every Day    Packs/day: 0.25    Years: 20.00    Additional pack years: 0.00    Total pack years: 5.00    Types: Cigarettes   Smokeless tobacco: Never  Vaping Use    Vaping Use: Never used  Substance Use Topics   Alcohol use: No   Drug use: No     Allergies   Patient has no known allergies.   Review of Systems Review of Systems Per HPI  Physical Exam Triage Vital Signs ED Triage Vitals [11/27/22 1548]  Enc Vitals Group     BP 121/87     Pulse Rate 90     Resp 20     Temp 97.6 F (36.4 C)     Temp Source Oral     SpO2 97 %     Weight      Height      Head Circumference      Peak Flow      Pain Score 0     Pain Loc      Pain Edu?      Excl. in GC?    No data found.  Updated Vital Signs BP 121/87 (BP Location: Right Arm)   Pulse 90   Temp 97.6 F (36.4 C) (Oral)   Resp 20   LMP 11/17/2022 (Approximate)   SpO2 97%   Visual Acuity Right Eye Distance:   Left Eye Distance:   Bilateral Distance:    Right Eye Near:   Left Eye Near:    Bilateral Near:     Physical Exam Vitals and nursing note reviewed.  Constitutional:      General: She is not in acute distress.    Appearance: She is not toxic-appearing.  Abdominal:     General: Abdomen is flat. Bowel sounds are normal. There is no distension.     Palpations: Abdomen is soft. There is no mass.     Tenderness: There is no abdominal tenderness. There is no right CVA tenderness, left CVA tenderness or guarding.  Skin:    General: Skin is warm and dry.     Coloration: Skin is not jaundiced or pale.     Findings: No erythema.  Neurological:     Mental Status: She is alert and oriented to person, place, and time.     Motor: No weakness.     Gait: Gait normal.  Psychiatric:        Behavior: Behavior is cooperative.      UC Treatments / Results  Labs (all labs ordered are listed, but only abnormal results are displayed) Labs Reviewed  POCT URINALYSIS DIP (MANUAL ENTRY) - Abnormal; Notable for the following components:      Result Value   Clarity, UA cloudy (*)    Ketones, POC  UA trace (5) (*)    Blood, UA trace-intact (*)    Leukocytes, UA Moderate (2+) (*)     All other components within normal limits  URINE CULTURE    EKG   Radiology No results found.  Procedures Procedures (including critical care time)  Medications Ordered in UC Medications - No data to display  Initial Impression / Assessment and Plan / UC Course  I have reviewed the triage vital signs and the nursing notes.  Pertinent labs & imaging results that were available during my care of the patient were reviewed by me and considered in my medical decision making (see chart for details).   Patient is well-appearing, normotensive, afebrile, not tachycardic, not tachypneic, oxygenating well on room air.    1. Acute cystitis with hematuria Urinalysis today is cloudy with trace intact blood and moderate leukocyte Estrace Urine culture is pending In the meantime, will treat with Macrobid twice daily for 5 days Other supportive care discussed with patient including pushing hydration with water Strict ER precautions discussed with patient Prescription given for fluconazole she develops a yeast infection after finishing the antibiotics.  She reports that this typically occurs after taking antibiotic therapy.  The patient was given the opportunity to ask questions.  All questions answered to their satisfaction.  The patient is in agreement to this plan.    Final Clinical Impressions(s) / UC Diagnoses   Final diagnoses:  Acute cystitis with hematuria     Discharge Instructions      I suspect you have a UTI.  Take the macrobid as prescribed to treat it.  We will call you later this week if we need to change the antibiotic based on the urine culture.  Continue drinking plenty of water.      ED Prescriptions     Medication Sig Dispense Auth. Provider   nitrofurantoin, macrocrystal-monohydrate, (MACROBID) 100 MG capsule Take 1 capsule (100 mg total) by mouth 2 (two) times daily for 5 days. 10 capsule Cathlean Marseilles A, NP   fluconazole (DIFLUCAN) 150 MG tablet Take 1  tablet (150 mg total) by mouth once for 1 dose. 1 tablet Valentino Nose, NP      PDMP not reviewed this encounter.   Valentino Nose, NP 11/27/22 1623

## 2022-11-27 NOTE — ED Triage Notes (Signed)
Pt reports "cloudy" urine and dysuria since waking up this morning.

## 2022-11-27 NOTE — Discharge Instructions (Signed)
I suspect you have a UTI.  Take the macrobid as prescribed to treat it.  We will call you later this week if we need to change the antibiotic based on the urine culture.  Continue drinking plenty of water.

## 2022-11-29 ENCOUNTER — Ambulatory Visit (INDEPENDENT_AMBULATORY_CARE_PROVIDER_SITE_OTHER): Payer: No Typology Code available for payment source | Admitting: Family Medicine

## 2022-11-29 ENCOUNTER — Encounter: Payer: Self-pay | Admitting: Family Medicine

## 2022-11-29 VITALS — BP 106/64 | HR 100 | Temp 98.2°F | Resp 16 | Ht 68.75 in | Wt 210.2 lb

## 2022-11-29 DIAGNOSIS — J3089 Other allergic rhinitis: Secondary | ICD-10-CM | POA: Diagnosis not present

## 2022-11-29 DIAGNOSIS — J302 Other seasonal allergic rhinitis: Secondary | ICD-10-CM | POA: Diagnosis not present

## 2022-11-29 DIAGNOSIS — K219 Gastro-esophageal reflux disease without esophagitis: Secondary | ICD-10-CM | POA: Insufficient documentation

## 2022-11-29 DIAGNOSIS — L563 Solar urticaria: Secondary | ICD-10-CM

## 2022-11-29 LAB — URINE CULTURE: Culture: >=100000 — AB

## 2022-11-29 NOTE — Patient Instructions (Signed)
Allergic rhinitis Continue allergen avoidance measures directed toward grass pollen, tree pollen, and mold as listed below Continue Allegra 180 mg once a day as needed for runny nose or itch.  You may take an additional dose of Allegra 180 mg once a day as needed for breakthrough symptoms Continue Flonase 2 sprays in each nostril once a day as needed for stuffy nose.  In the right nostril, point the applicator out toward the right ear. In the left nostril, point the applicator out toward the left ear Consider saline nasal rinses as needed for nasal symptoms. Use this before any medicated nasal sprays for best result Consider allergen immunotherapy if your symptoms are not well-controlled with the treatment plan as listed above  Hives (urticaria) Take the least amount of medications while remaining hive free Allegra 180 (fexofenadine) 180 mg twice a day and famotidine (Pepcid) 20 mg twice a day. If no symptoms for 7-14 days then decrease to. Allegra (fexofenadine) twice 180 mg a day and famotidine (Pepcid) 20 mg once a day.  If no symptoms for 7-14 days then decrease to. Allegra (fexofenadine)180 mg  twice a day.  If no symptoms for 7-14 days then decrease to. Allegra (fexofenadine)180 mg once a day.  May use Benadryl (diphenhydramine) as needed for breakthrough hives       If symptoms return, then step up dosage Keep a detailed symptom journal including foods eaten, contact with allergens, medications taken, weather changes.  Consider Xolair if the treatment plan as listed above is not controlling your hives  Reflux Continue dietary and lifestyle modifications as listed below Continue famotidine 20 mg once or twice a day for reflux control  Call the clinic if this treatment plan is not working well for you  Follow up in 1 year or sooner if needed.  Reducing Pollen Exposure The American Academy of Allergy, Asthma and Immunology suggests the following steps to reduce your exposure to pollen  during allergy seasons. Do not hang sheets or clothing out to dry; pollen may collect on these items. Do not mow lawns or spend time around freshly cut grass; mowing stirs up pollen. Keep windows closed at night.  Keep car windows closed while driving. Minimize morning activities outdoors, a time when pollen counts are usually at their highest. Stay indoors as much as possible when pollen counts or humidity is high and on windy days when pollen tends to remain in the air longer. Use air conditioning when possible.  Many air conditioners have filters that trap the pollen spores. Use a HEPA room air filter to remove pollen form the indoor air you breathe.  Control of Mold Allergen Mold and fungi can grow on a variety of surfaces provided certain temperature and moisture conditions exist.  Outdoor molds grow on plants, decaying vegetation and soil.  The major outdoor mold, Alternaria and Cladosporium, are found in very high numbers during hot and dry conditions.  Generally, a late Summer - Fall peak is seen for common outdoor fungal spores.  Rain will temporarily lower outdoor mold spore count, but counts rise rapidly when the rainy period ends.  The most important indoor molds are Aspergillus and Penicillium.  Dark, humid and poorly ventilated basements are ideal sites for mold growth.  The next most common sites of mold growth are the bathroom and the kitchen.  Outdoor [employer] Use air conditioning and keep windows closed Avoid exposure to decaying vegetation. Avoid leaf raking. Avoid grain handling. Consider wearing a face mask if working  in moldy areas.  Indoor Mold Control Maintain humidity below 50%. Clean washable surfaces with 5% bleach solution. Remove sources e.g. Contaminated carpets.

## 2022-11-29 NOTE — Progress Notes (Signed)
[address] [place] [address] Dept: [phone]  FOLLOW UP NOTE  Patient ID: Stefanie [name], female    DOB: 1986/01/03  Age: 37 y.o. MRN: [id number] Date of Office Visit: 11/29/2022  Assessment  Chief Complaint: Allergic Rhinitis  (Says she is the same. )  HPI Stefanie [name] is a 37 year old female who presents to the clinic for follow-up visit.  She was last seen in this clinic on 10/30/2022 by Nehemiah Settle for evaluation of allergic rhinitis, solar urticaria, and globus sensation.    Today's visit, she reports her allergic rhinitis has been moderately well-controlled with occasional clear rhinorrhea and nasal congestion.  She continues Allegra as needed and Flonase as needed.  She is not currently using a nasal saline rinse.  She reports that her symptoms are mostly controlled with this medication regimen.  Her last environmental allergy testing was on 10/30/2022 was positive to grass pollen, tree pollen, mold mix 1, and mold mix 3.  Urticaria is reported as moderately well-controlled with red, raised, itchy areas occurring on her trunk and arms with exposure to sun.  She reports that she does not experience hives on her legs with exposure to sun.  She denies concomitant cardiopulmonary or gastrointestinal symptoms with these hives.  She continues Allegra and triamcinolone as needed for control of itch and hives.  She reports satisfaction with this medication regimen.  She has received information regarding Xolair, however, is not interested in this medication at this time.  Reflux is reported as moderately well-controlled with symptoms occurring about 4 out of 7 days a week.  She continues famotidine as needed with relief of symptoms.  She does report the globus sensation occurs in her throat just before illness.  Otherwise, she does not experience a globus sensation.  Her current medications are listed in the chart.    Drug Allergies:  No Known  Allergies  Physical Exam: BP 106/64   Pulse 100   Temp 98.2 F (36.8 C) (Temporal)   Resp 16   Ht 5' 8.75" (1.746 m)   Wt 210 lb 3.2 oz (95.3 kg)   LMP 11/17/2022 (Approximate)   SpO2 98%   BMI 31.27 kg/m    Physical Exam Vitals reviewed.  Constitutional:      Appearance: Normal appearance.  HENT:     Head: Normocephalic and atraumatic.     Right Ear: Tympanic membrane normal.     Left Ear: Tympanic membrane normal.     Nose: Nose normal.     Comments: Bilateral nares slightly erythematous with clear nasal drainage noted.  Pharynx normal.  Ears normal.  Eyes normal.    Mouth/Throat:     Pharynx: Oropharynx is clear.  Eyes:     Conjunctiva/sclera: Conjunctivae normal.  Cardiovascular:     Rate and Rhythm: Normal rate and regular rhythm.     Heart sounds: Normal heart sounds. No murmur heard. Pulmonary:     Effort: Pulmonary effort is normal.     Breath sounds: Normal breath sounds.     Comments: Lungs clear to auscultation Musculoskeletal:        General: Normal range of motion.     Cervical back: Normal range of motion and neck supple.  Skin:    General: Skin is warm and dry.     Comments: Scattered hives noted bilateral arms.  No open areas or drainage noted.  Neurological:     Mental Status: She is alert and oriented to person, place, and time.  Psychiatric:        Mood and Affect: Mood normal.        Behavior: Behavior normal.        Thought Content: Thought content normal.        Judgment: Judgment normal.     Assessment and Plan: 1. Solar urticaria   2. Gastroesophageal reflux disease, unspecified whether esophagitis present   3. Seasonal and perennial allergic rhinitis     No orders of the defined types were placed in this encounter.   Patient Instructions  Allergic rhinitis Continue allergen avoidance measures directed toward grass pollen, tree pollen, and mold as listed below Continue Allegra 180 mg once a day as needed for runny nose or itch.   You may take an additional dose of Allegra 180 mg once a day as needed for breakthrough symptoms Continue Flonase 2 sprays in each nostril once a day as needed for stuffy nose.  In the right nostril, point the applicator out toward the right ear. In the left nostril, point the applicator out toward the left ear Consider saline nasal rinses as needed for nasal symptoms. Use this before any medicated nasal sprays for best result Consider allergen immunotherapy if your symptoms are not well-controlled with the treatment plan as listed above  Hives (urticaria) Take the least amount of medications while remaining hive free Allegra 180 (fexofenadine) 180 mg twice a day and famotidine (Pepcid) 20 mg twice a day. If no symptoms for 7-14 days then decrease to. Allegra (fexofenadine) twice 180 mg a day and famotidine (Pepcid) 20 mg once a day.  If no symptoms for 7-14 days then decrease to. Allegra (fexofenadine)180 mg  twice a day.  If no symptoms for 7-14 days then decrease to. Allegra (fexofenadine)180 mg once a day.  May use Benadryl (diphenhydramine) as needed for breakthrough hives       If symptoms return, then step up dosage Keep a detailed symptom journal including foods eaten, contact with allergens, medications taken, weather changes.  Consider Xolair if the treatment plan as listed above is not controlling your hives  Reflux Continue dietary and lifestyle modifications as listed below Continue famotidine 20 mg once or twice a day for reflux control  Call the clinic if this treatment plan is not working well for you  Follow up in 1 year or sooner if needed.   Return in about 1 year (around 11/29/2023), or if symptoms worsen or fail to improve.    Thank you for the opportunity to care for this patient.  Please do not hesitate to contact me with questions.  Thermon Leyland, FNP [facility]

## 2023-01-19 ENCOUNTER — Ambulatory Visit: Payer: No Typology Code available for payment source

## 2023-01-19 ENCOUNTER — Ambulatory Visit
Admission: RE | Admit: 2023-01-19 | Discharge: 2023-01-19 | Disposition: A | Discharge status: Home / Self Care | Payer: No Typology Code available for payment source | Source: Ambulatory Visit | Attending: Nurse Practitioner | Admitting: Nurse Practitioner

## 2023-01-19 VITALS — BP 115/77 | HR 103 | Temp 98.0°F | Resp 18

## 2023-01-19 DIAGNOSIS — J0140 Acute pansinusitis, unspecified: Secondary | ICD-10-CM

## 2023-01-19 MED ORDER — PSEUDOEPH-BROMPHEN-DM 30-2-10 MG/5ML PO SYRP: 5.0000 mL | ORAL_SOLUTION | Freq: Four times a day (QID) | ORAL | 0 refills | Status: DC | PRN

## 2023-01-19 MED ORDER — FLUTICASONE PROPIONATE 50 MCG/ACT NA SUSP: 2.0000 | Freq: Every day | NASAL | 0 refills | Status: AC

## 2023-01-19 MED ORDER — AMOXICILLIN-POT CLAVULANATE 875-125 MG PO TABS: 1.0000 | ORAL_TABLET | Freq: Two times a day (BID) | ORAL | 0 refills | Status: DC

## 2023-01-19 NOTE — ED Provider Notes (Signed)
[facility]    CSN: [id number] Arrival date & time: 01/19/23  1046      History   Chief Complaint Chief Complaint  Patient presents with   Sore Throat    Head congestion and cough since Tuesday - Entered by patient    HPI Stefanie [name] is a 37 y.o. female.   The history is provided by the patient.   Patient presents for complaints of fever, headache, nasal congestion, sinus pressure, and cough.  Patient states symptoms started approximately 5 days ago.  Tmax 102.  Denies ear pain, ear drainage, sore throat, wheezing, shortness of breath, difficulty breathing, chest pain, abdominal pain, nausea, vomiting, or diarrhea.  Patient reports that she has been taking over-the-counter cough and cold medications along with Claritin-D with little relief.  She states that she took 2 home COVID test which were both negative.  Past Medical History:  Diagnosis Date   Abnormal Pap smear of cervix    Discoid lupus 10/15/2018   Being followed by [facility] dermatology April 2020 Was seen by [facility] rheumatology they did not feel the patient has systemic lupus   Herpes 10/12/2013   HSV-2 (herpes simplex virus 2) infection    Hx of gonorrhea    Pregnant 06/23/2015    Patient Active Problem List   Diagnosis Date Noted   Seasonal and perennial allergic rhinitis 11/29/2022   Gastroesophageal reflux disease 11/29/2022   Solar urticaria 11/29/2022   Encounter for surveillance of contraceptive pills 11/27/2021   PMS (premenstrual syndrome) 08/27/2021   Oral contraception initial prescription 08/27/2021   Pregnancy examination or test, negative result 08/27/2021   Encounter for gynecological examination with Papanicolaou smear of cervix 08/27/2021   Acute frontal sinusitis 08/16/2021   Discoid lupus 10/15/2018   Nephrolithiasis 12/31/2014   Herpes 10/12/2013    Past Surgical History:  Procedure Laterality Date   CHOLECYSTECTOMY N/A 01/02/2015   Procedure: LAPAROSCOPIC  CHOLECYSTECTOMY;  Surgeon: Franky Macho, MD;  Location: [facility];  Service: General;  Laterality: N/A;   COLPOSCOPY     colpo with biopsy   WISDOM TOOTH EXTRACTION      OB History     Gravida  4   Para  3   Term  3   Preterm      AB  1   Living  3      SAB  1   IAB      Ectopic      Multiple  0   Live Births  3            Home Medications    Prior to Admission medications   Medication Sig Start Date End Date Taking? Authorizing Provider  amoxicillin-clavulanate (AUGMENTIN) 875-125 MG tablet Take 1 tablet by mouth every 12 (twelve) hours. 01/19/23  Yes Navreet Bolda-Warren, Sadie Haber, NP  brompheniramine-pseudoephedrine-DM 30-2-10 MG/5ML syrup Take 5 mLs by mouth 4 (four) times daily as needed. 01/19/23  Yes Suri Tafolla-Warren, Sadie Haber, NP  fluticasone (FLONASE) 50 MCG/ACT nasal spray Place 2 sprays into both nostrils daily. 01/19/23  Yes Qamar Rosman-Warren, Sadie Haber, NP  acyclovir (ZOVIRAX) 400 MG tablet Take 1 tablet (400 mg total) by mouth 2 (two) times daily. 10/10/22   Adline Potter, NP  Cetirizine HCl (ZYRTEC PO) Take by mouth.    [provider]  famotidine (PEPCID) 20 MG tablet Take 1 tablet (20 mg total) by mouth 2 (two) times daily. For two months, then twice daily as needed. 02/07/21   Tiffany Kocher,  PA-C  norethindrone (MICRONOR) 0.35 MG tablet Take 1 tablet by mouth once daily 09/16/22   Cyril Mourning A, NP  sodium fluoride (FLUORISHIELD) 1.1 % GEL dental gel Place 1 Application onto teeth at bedtime. 10/14/22   [provider]  triamcinolone cream (KENALOG) 0.1 % Apply 1 Application topically 2 (two) times daily as needed. 09/25/22   Alfonse Spruce, MD    Family History Family History  Problem Relation Age of Onset   Heart attack Father    Heart disease Father    Dementia Maternal Grandmother    Cancer Maternal Grandfather        throat, lung, prostate cancer   Diabetes Paternal Grandmother    Heart attack Paternal Grandfather     Heart disease Paternal Grandfather    Diabetes Maternal Uncle    Colon cancer Neg Hx     Social History Social History   Tobacco Use   Smoking status: Every Day    Current packs/day: 0.25    Average packs/day: 0.3 packs/day for 20.0 years (5.0 ttl pk-yrs)    Types: Cigarettes   Smokeless tobacco: Never  Vaping Use   Vaping status: Never Used  Substance Use Topics   Alcohol use: No   Drug use: No     Allergies   Patient has no known allergies.   Review of Systems Review of Systems Per HPI  Physical Exam Triage Vital Signs ED Triage Vitals  Encounter Vitals Group     BP 01/19/23 1050 115/77     Systolic BP Percentile --      Diastolic BP Percentile --      Pulse Rate 01/19/23 1050 (!) 103     Resp 01/19/23 1050 18     Temp 01/19/23 1050 98 F (36.7 C)     Temp src --      SpO2 01/19/23 1050 96 %     Weight --      Height --      Head Circumference --      Peak Flow --      Pain Score 01/19/23 1051 6     Pain Loc --      Pain Education --      Exclude from Growth Chart --    No data found.  Updated Vital Signs BP 115/77 (BP Location: Right Arm)   Pulse (!) 103   Temp 98 F (36.7 C)   Resp 18   LMP 12/15/2022 (Exact Date)   SpO2 96%   Visual Acuity Right Eye Distance:   Left Eye Distance:   Bilateral Distance:    Right Eye Near:   Left Eye Near:    Bilateral Near:     Physical Exam Vitals and nursing note reviewed.  Constitutional:      General: She is not in acute distress.    Appearance: She is well-developed.  HENT:     Head: Normocephalic.     Right Ear: Tympanic membrane and ear canal normal.     Left Ear: Tympanic membrane and ear canal normal.     Nose: Congestion present. No rhinorrhea.     Right Turbinates: Enlarged and swollen.     Left Turbinates: Enlarged and swollen.     Right Sinus: Maxillary sinus tenderness and frontal sinus tenderness present.     Left Sinus: Maxillary sinus tenderness and frontal sinus tenderness  present.     Mouth/Throat:     Lips: Pink.     Mouth: Mucous membranes are  moist.     Pharynx: Oropharynx is clear. Uvula midline. Posterior oropharyngeal erythema and postnasal drip present. No pharyngeal swelling, oropharyngeal exudate or uvula swelling.  Eyes:     Conjunctiva/sclera: Conjunctivae normal.     Pupils: Pupils are equal, round, and reactive to light.  Cardiovascular:     Rate and Rhythm: Normal rate and regular rhythm.     Heart sounds: Normal heart sounds.  Pulmonary:     Effort: Pulmonary effort is normal. No respiratory distress.     Breath sounds: Normal breath sounds. No stridor. No wheezing, rhonchi or rales.  Abdominal:     General: Bowel sounds are normal.     Palpations: Abdomen is soft.     Tenderness: There is no abdominal tenderness.  Musculoskeletal:     Cervical back: Normal range of motion.  Lymphadenopathy:     Cervical: No cervical adenopathy.  Skin:    General: Skin is warm and dry.  Neurological:     General: No focal deficit present.     Mental Status: She is alert and oriented to person, place, and time.  Psychiatric:        Mood and Affect: Mood normal.        Behavior: Behavior normal.      UC Treatments / Results  Labs (all labs ordered are listed, but only abnormal results are displayed) Labs Reviewed - No data to display  EKG   Radiology No results found.  Procedures Procedures (including critical care time)  Medications Ordered in UC Medications - No data to display  Initial Impression / Assessment and Plan / UC Course  I have reviewed the triage vital signs and the nursing notes.  Pertinent labs & imaging results that were available during my care of the patient were reviewed by me and considered in my medical decision making (see chart for details).  The patient is well-appearing, she is in no acute distress, vital signs are stable.  Will treat for acute pansinusitis.  Patient with fever of 102.  She continues to  complain of worsening symptoms despite use of over-the-counter antihistamines and cough and cold medications.  Home COVID test were negative.  Will treat with Augmentin 875/125 mg tablets for the next 7 days, fluticasone 50 mcg nasal spray for nasal congestion, and Bromfed-DM for her cough and to act as an antihistamine.  Supportive care recommendations were provided and discussed with the patient to include increasing fluids, allowing for plenty of rest, over-the-counter analgesics for pain or discomfort, and normal saline nasal spray for nasal congestion.  Patient advised to follow-up in this clinic or with her primary care physician if symptoms fail to improve.  Patient is in agreement with this plan of care and verbalizes understanding.  All questions were answered.  Patient stable for discharge.   Final Clinical Impressions(s) / UC Diagnoses   Final diagnoses:  Acute pansinusitis, recurrence not specified     Discharge Instructions      Take medication as directed. Increase fluids and get plenty of rest. May take over-the-counter ibuprofen or Tylenol as needed for pain, fever, or general discomfort. Recommend normal saline nasal spray to help with nasal congestion throughout the day. Recommend using a humidifier in your bedroom at nighttime during sleep and sleeping elevated on pillows while cough symptoms persist. If symptoms do not improve with this treatment, please follow-up with your primary care physician for further evaluation. Follow-up as needed.      ED Prescriptions  Medication Sig Dispense Auth. Provider   fluticasone (FLONASE) 50 MCG/ACT nasal spray Place 2 sprays into both nostrils daily. 16 g Aydenn Gervin-Warren, Sadie Haber, NP   brompheniramine-pseudoephedrine-DM 30-2-10 MG/5ML syrup Take 5 mLs by mouth 4 (four) times daily as needed. 140 mL Cathey Fredenburg-Warren, Sadie Haber, NP   amoxicillin-clavulanate (AUGMENTIN) 875-125 MG tablet Take 1 tablet by mouth every 12 (twelve)  hours. 14 tablet [place] Murthy-Warren, Sadie Haber, NP      PDMP not reviewed this encounter.   Abran Cantor, NP 01/19/23 1105

## 2023-01-19 NOTE — Discharge Instructions (Signed)
Take medication as directed. Increase fluids and get plenty of rest. May take over-the-counter ibuprofen or Tylenol as needed for pain, fever, or general discomfort. Recommend normal saline nasal spray to help with nasal congestion throughout the day. Recommend using a humidifier in your bedroom at nighttime during sleep and sleeping elevated on pillows while cough symptoms persist. If symptoms do not improve with this treatment, please follow-up with your primary care physician for further evaluation. Follow-up as needed.

## 2023-01-19 NOTE — ED Triage Notes (Signed)
Cough since Tuesday.  Nasal congestion and sinus pressure, headache, and fever on Wednesday.  Has been taking Claritin D with little relief.  Home covid test x 2 were negative.

## 2023-01-30 ENCOUNTER — Ambulatory Visit: Payer: No Typology Code available for payment source | Admitting: Family Medicine

## 2023-01-30 VITALS — BP 112/81 | HR 86 | Temp 97.9°F | Wt 214.0 lb

## 2023-01-30 DIAGNOSIS — N390 Urinary tract infection, site not specified: Secondary | ICD-10-CM | POA: Insufficient documentation

## 2023-01-30 DIAGNOSIS — J320 Chronic maxillary sinusitis: Secondary | ICD-10-CM | POA: Diagnosis not present

## 2023-01-30 DIAGNOSIS — N3000 Acute cystitis without hematuria: Secondary | ICD-10-CM | POA: Diagnosis not present

## 2023-01-30 DIAGNOSIS — J329 Chronic sinusitis, unspecified: Secondary | ICD-10-CM | POA: Insufficient documentation

## 2023-01-30 LAB — POCT URINALYSIS DIP (CLINITEK)
Bilirubin, UA: NEGATIVE
Blood, UA: NEGATIVE
Glucose, UA: NEGATIVE mg/dL
Ketones, POC UA: NEGATIVE mg/dL
Nitrite, UA: NEGATIVE
POC PROTEIN,UA: NEGATIVE
Spec Grav, UA: <=1.005 — AB (ref 1.010–1.025)
Urobilinogen, UA: 0.2 E.U./dL
pH, UA: 6 (ref 5.0–8.0)

## 2023-01-30 MED ORDER — LEVOFLOXACIN 500 MG PO TABS: 500.0000 mg | ORAL_TABLET | Freq: Every day | ORAL | 0 refills | Status: AC

## 2023-01-30 NOTE — Assessment & Plan Note (Signed)
Persistent symptoms.  Lack of improvement.  Placing on Levaquin.

## 2023-01-30 NOTE — Assessment & Plan Note (Signed)
Placing on Levaquin as it covers both the urinary tract and respiratory tract.

## 2023-01-30 NOTE — Patient Instructions (Addendum)
Antibiotic as prescribed.  Call with concerns.  Take care  Dr. Cook  

## 2023-01-30 NOTE — Progress Notes (Signed)
Subjective:  Patient ID: Stefanie [name], female    DOB: [birth year]  Age: 37 y.o. MRN: [id number]  CC: Respiratory symptoms, ? UTI   HPI:  37 year old female presents for evaluation of the above.  Patient reports that she has had ongoing respiratory symptoms since July 30.  Seen in urgent care on 8/4.  Was prescribed Augmentin for sinusitis.  Patient states that she has completed the course and is still having persistent symptoms.  She reports congestion and sinus pain/pressure particularly in the maxillary region.  Has associated cough.  No current fever.  Patient also reports that this morning she developed urinary discomfort and cloudy urine.  She reports urinary frequency as well.  Patient Active Problem List   Diagnosis Date Noted   Sinusitis 01/30/2023   UTI (urinary tract infection) 01/30/2023   Seasonal and perennial allergic rhinitis 11/29/2022   Gastroesophageal reflux disease 11/29/2022   Solar urticaria 11/29/2022   Encounter for surveillance of contraceptive pills 11/27/2021   PMS (premenstrual syndrome) 08/27/2021   Discoid lupus 10/15/2018   Nephrolithiasis 12/31/2014   Herpes 10/12/2013    Social Hx   Social History   Socioeconomic History   Marital status: Married    Spouse name: Not on file   Number of children: 3   Years of education: Not on file   Highest education level: Not on file  Occupational History   Not on file  Tobacco Use   Smoking status: Every Day    Current packs/day: 0.25    Average packs/day: 0.3 packs/day for 20.0 years (5.0 ttl pk-yrs)    Types: Cigarettes   Smokeless tobacco: Never  Vaping Use   Vaping status: Never Used  Substance and Sexual Activity   Alcohol use: No   Drug use: No   Sexual activity: Yes    Birth control/protection: Condom, Pill  Other Topics Concern   Not on file  Social History Narrative   Not on file   Social Determinants of Health   Financial Resource Strain: Medium Risk (08/27/2021)   Overall  Financial Resource Strain (CARDIA)    Difficulty of Paying Living Expenses: Somewhat hard  Food Insecurity: No Food Insecurity (08/27/2021)   Hunger Vital Sign    Worried About Running Out of Food in the Last Year: Never true    Ran Out of Food in the Last Year: Never true  Transportation Needs: No Transportation Needs (08/27/2021)   PRAPARE - Administrator, Civil Service (Medical): No    Lack of Transportation (Non-Medical): No  Physical Activity: Insufficiently Active (08/27/2021)   Exercise Vital Sign    Days of Exercise per Week: 2 days    Minutes of Exercise per Session: 20 min  Stress: Stress Concern Present (08/27/2021)   [employer] of Occupational Health - Occupational Stress Questionnaire    Feeling of Stress : To some extent  Social Connections: Socially Integrated (08/27/2021)   Social Connection and Isolation Panel [NHANES]    Frequency of Communication with Friends and Family: More than three times a week    Frequency of Social Gatherings with Friends and Family: Once a week    Attends Religious Services: 1 to 4 times per year    Active Member of [employer] or Organizations: No    Attends Banker Meetings: 1 to 4 times per year    Marital Status: Married    Review of Systems Per HPI  Objective:  BP 112/81   Pulse 86   Temp  97.9 F (36.6 C)   Wt 214 lb (97.1 kg)   LMP 12/15/2022 (Exact Date)   SpO2 98%   BMI 31.83 kg/m      01/30/2023    3:50 PM 01/19/2023   10:50 AM 11/29/2022    2:31 PM  BP/Weight  Systolic BP 112 115 106  Diastolic BP 81 77 64  Wt. (Lbs) 214  210.2  BMI 31.83 kg/m2  31.27 kg/m2    Physical Exam Vitals and nursing note reviewed.  Constitutional:      General: She is not in acute distress.    Appearance: Normal appearance.  HENT:     Head: Normocephalic and atraumatic.     Nose:     Comments: Maxillary sinus tenderness.     Mouth/Throat:     Pharynx: Oropharynx is clear.  Cardiovascular:     Rate and  Rhythm: Normal rate and regular rhythm.  Pulmonary:     Effort: Pulmonary effort is normal.     Breath sounds: Normal breath sounds. No wheezing or rales.  Neurological:     Mental Status: She is alert.     Lab Results  Component Value Date   WBC 4.4 08/24/2021   HGB 14.5 08/24/2021   HCT 40.7 08/24/2021   PLT 200 08/24/2021   GLUCOSE 89 08/24/2021   CHOL 105 08/24/2021   TRIG 44 08/24/2021   HDL 30 (L) 08/24/2021   LDLCALC 64 08/24/2021   ALT 7 08/24/2021   AST 10 08/24/2021   NA 139 08/24/2021   K 4.1 08/24/2021   CL 103 08/24/2021   CREATININE 0.91 08/24/2021   BUN 17 08/24/2021   CO2 23 08/24/2021   TSH 1.480 08/24/2021   INR 1.15 01/01/2015   HGBA1C 5.0 08/18/2020     Assessment & Plan:   Problem List Items Addressed This Visit       Respiratory   Sinusitis - Primary    Persistent symptoms.  Lack of improvement.  Placing on Levaquin.      Relevant Medications   levofloxacin (LEVAQUIN) 500 MG tablet     Genitourinary   UTI (urinary tract infection)    Placing on Levaquin as it covers both the urinary tract and respiratory tract.      Relevant Orders   POCT URINALYSIS DIP (CLINITEK) (Completed)   Urine Culture    Meds ordered this encounter  Medications   levofloxacin (LEVAQUIN) 500 MG tablet    Sig: Take 1 tablet (500 mg total) by mouth daily for 7 days.    Dispense:  7 tablet    Refill:  0    Follow-up:  Return if symptoms worsen or fail to improve.  Everlene Other DO [facility]

## 2023-02-02 LAB — URINE CULTURE

## 2023-02-02 LAB — SPECIMEN STATUS REPORT

## 2023-04-29 ENCOUNTER — Encounter: Payer: Self-pay | Admitting: Adult Health

## 2023-04-29 ENCOUNTER — Ambulatory Visit (INDEPENDENT_AMBULATORY_CARE_PROVIDER_SITE_OTHER): Payer: No Typology Code available for payment source | Admitting: Adult Health

## 2023-04-29 VITALS — BP 112/81 | HR 81 | Ht 68.75 in | Wt 208.4 lb

## 2023-04-29 DIAGNOSIS — Z3041 Encounter for surveillance of contraceptive pills: Secondary | ICD-10-CM | POA: Diagnosis not present

## 2023-04-29 DIAGNOSIS — R4589 Other symptoms and signs involving emotional state: Secondary | ICD-10-CM | POA: Diagnosis not present

## 2023-04-29 MED ORDER — SLYND 4 MG PO TABS: 1.0000 | ORAL_TABLET | Freq: Every day | ORAL | Status: DC

## 2023-04-29 MED ORDER — ESCITALOPRAM OXALATE 10 MG PO TABS: 10.0000 mg | ORAL_TABLET | Freq: Every day | ORAL | 2 refills | Status: DC

## 2023-04-29 NOTE — Progress Notes (Signed)
  Subjective:     Patient ID: Stefanie [name], female   DOB: 27-Feb-1986, 37 y.o.   MRN: [id number]  HPI Lylian is a 37 year old white female, married, [id number] in complaining of being moody and has gained weight.  She is working from home, and mood worse before periods.     Component Value Date/Time   DIAGPAP  08/27/2021 1508    - Negative for intraepithelial lesion or malignancy (NILM)   DIAGPAP  07/29/2018 0000    NEGATIVE FOR INTRAEPITHELIAL LESIONS OR MALIGNANCY.   HPVHIGH Negative 08/27/2021 1508   ADEQPAP  08/27/2021 1508    Satisfactory for evaluation; transformation zone component PRESENT.   ADEQPAP  07/29/2018 0000    Satisfactory for evaluation  endocervical/transformation zone component PRESENT.    PCP is Dr Adriana Simas   Review of Systems +moody  +weight gain  Moods worse before period  Reviewed past medical,surgical, social and family history. Reviewed medications and allergies.     Objective:   Physical Exam BP 112/81 (BP Location: Right Arm, Patient Position: Sitting, Cuff Size: Normal)   Pulse 81   Ht 5' 8.75" (1.746 m)   Wt 208 lb 6.4 oz (94.5 kg)   BMI 31.00 kg/m   Skin warm and dry.Lungs: clear to ausculation bilaterally. Cardiovascular: regular rate and rhythm.   Upstream - 04/29/23 1622       Pregnancy Intention Screening   Does the patient want to become pregnant in the next year? No    Does the patient's partner want to become pregnant in the next year? No    Would the patient like to discuss contraceptive options today? Yes      Contraception Wrap Up   Current Method Oral Contraceptive    End Method Oral Contraceptive    Contraception Counseling Provided No                Assessment:     1. Encounter for surveillance of contraceptive pills Finish micronor and start slynd, use condoms for 1 pack 3 packs given   2. [facility], seems worse before period Will rx lexapro Meds ordered this encounter  Medications   escitalopram (LEXAPRO) 10  MG tablet    Sig: Take 1 tablet (10 mg total) by mouth daily.    Dispense:  30 tablet    Refill:  2    Order Specific Question:   Supervising Provider    Answer:   Duane Lope H [[id number]]   Drospirenone (SLYND) 4 MG TABS    Sig: Take 1 tablet (4 mg total) by mouth daily.    Order Specific Question:   Supervising Provider    Answer:   Lazaro Arms [[id number]]       Plan:     Try walk around house more during the day and snack on low carb high fiber or protein Follow up in 8 weeks for ROS

## 2023-06-24 ENCOUNTER — Ambulatory Visit: Payer: No Typology Code available for payment source | Admitting: Adult Health

## 2023-06-24 ENCOUNTER — Encounter: Payer: Self-pay | Admitting: Adult Health

## 2023-06-24 VITALS — BP 111/78 | HR 84 | Ht 69.0 in | Wt 211.0 lb

## 2023-06-24 DIAGNOSIS — Z3041 Encounter for surveillance of contraceptive pills: Secondary | ICD-10-CM | POA: Diagnosis not present

## 2023-06-24 DIAGNOSIS — N943 Premenstrual tension syndrome: Secondary | ICD-10-CM | POA: Diagnosis not present

## 2023-06-24 DIAGNOSIS — Z1331 Encounter for screening for depression: Secondary | ICD-10-CM

## 2023-06-24 DIAGNOSIS — R4589 Other symptoms and signs involving emotional state: Secondary | ICD-10-CM

## 2023-06-24 MED ORDER — NORETHINDRONE 0.35 MG PO TABS: 1.0000 | ORAL_TABLET | Freq: Every day | ORAL | 3 refills | Status: DC

## 2023-06-24 MED ORDER — ESCITALOPRAM OXALATE 10 MG PO TABS: 10.0000 mg | ORAL_TABLET | Freq: Every day | ORAL | 2 refills | Status: DC

## 2023-06-24 NOTE — Progress Notes (Signed)
 Subjective:     Patient ID: Stefanie [name], female   DOB: Oct 28, 1985, 38 y.o.   MRN: [id number]  HPI Stefanie [name] is a 38 year old white female, married, [id number] back in follow up on starting lexapro  in November for being moody esp before  period, and is better, she never tried slynd , just continued Micronor .      Component Value Date/Time   DIAGPAP  08/27/2021 1508    - Negative for intraepithelial lesion or malignancy (NILM)   DIAGPAP  07/29/2018 0000    NEGATIVE FOR INTRAEPITHELIAL LESIONS OR MALIGNANCY.   HPVHIGH Negative 08/27/2021 1508   ADEQPAP  08/27/2021 1508    Satisfactory for evaluation; transformation zone component PRESENT.   ADEQPAP  07/29/2018 0000    Satisfactory for evaluation  endocervical/transformation zone component PRESENT.    PCP is Dr Bluford   Review of Systems Not as moody PMS better Reviewed past medical,surgical, social and family history. Reviewed medications and allergies.     Objective:   Physical Exam BP 111/78 (BP Location: Left Arm, Patient Position: Sitting, Cuff Size: Normal)   Pulse 84   Ht 5' 9 (1.753 m)   Wt 211 lb (95.7 kg)   LMP 06/10/2023   BMI 31.16 kg/m     Skin warm and dry.  Lungs: clear to ausculation bilaterally. Cardiovascular: regular rate and rhythm.  Fall risk is low    06/24/2023    4:04 PM 04/11/2022    2:26 PM 08/27/2021    2:55 PM  Depression screen PHQ 2/9  Decreased Interest 0 1 0  Down, Depressed, Hopeless 0 1 1  PHQ - 2 Score 0 2 1  Altered sleeping 0 1 0  Tired, decreased energy 1 1 1   Change in appetite 1 1 0  Feeling bad or failure about yourself  0 0 0  Trouble concentrating 1 0 0  Moving slowly or fidgety/restless 0 0 0  Suicidal thoughts 0 0 0  PHQ-9 Score 3 5 2   Difficult doing work/chores  Not difficult at all        06/24/2023    4:06 PM 04/11/2022    2:27 PM 08/27/2021    2:56 PM  GAD 7 : Generalized Anxiety Score  Nervous, Anxious, on Edge 1 1 1   Control/stop worrying 1 0 1  Worry too much -  different things 1 1 1   Trouble relaxing 1 1 1   Restless 0 0 0  Easily annoyed or irritable 1 1 1   Afraid - awful might happen 0 0 1  Total GAD 7 Score 5 4 6   Anxiety Difficulty  Not difficult at all       Upstream - 06/24/23 1601       Pregnancy Intention Screening   Does the patient want to become pregnant in the next year? No    Does the patient's partner want to become pregnant in the next year? No    Would the patient like to discuss contraceptive options today? No      Contraception Wrap Up   Current Method Female Condom;Oral Contraceptive    End Method Female Condom;Oral Contraceptive    Contraception Counseling Provided Yes             Assessment:     1. Moody (Primary) Not as moody  Happy with lexapro  10 mg 1 daily, will refill   2. Encounter for surveillance of contraceptive pills Will refill Micronor  Meds ordered this encounter  Medications   escitalopram  (LEXAPRO ) 10  MG tablet    Sig: Take 1 tablet (10 mg total) by mouth daily.    Dispense:  90 tablet    Refill:  2    Supervising Provider:   JAYNE MINDER H [[id number]]   norethindrone  (MICRONOR ) 0.35 MG tablet    Sig: Take 1 tablet (0.35 mg total) by mouth daily.    Dispense:  84 tablet    Refill:  3    Supervising Provider:   JAYNE MINDER H [[id number]]     3. PMS (premenstrual syndrome) Better with lexapro      Plan:     Follow up in 6 months or sooner if needed

## 2023-07-24 ENCOUNTER — Ambulatory Visit
Admission: RE | Admit: 2023-07-24 | Discharge: 2023-07-24 | Disposition: A | Discharge status: Home / Self Care | Payer: No Typology Code available for payment source | Source: Ambulatory Visit | Attending: Nurse Practitioner | Admitting: Nurse Practitioner

## 2023-07-24 VITALS — BP 122/86 | HR 94 | Temp 98.7°F | Resp 18

## 2023-07-24 DIAGNOSIS — J0140 Acute pansinusitis, unspecified: Secondary | ICD-10-CM

## 2023-07-24 MED ORDER — AMOXICILLIN-POT CLAVULANATE 875-125 MG PO TABS: 1.0000 | ORAL_TABLET | Freq: Two times a day (BID) | ORAL | 0 refills | Status: DC

## 2023-07-24 MED ORDER — FLUCONAZOLE 150 MG PO TABS: 150.0000 mg | ORAL_TABLET | Freq: Once | ORAL | 0 refills | Status: AC

## 2023-07-24 NOTE — ED Triage Notes (Signed)
 Nasal congestion and facial pressure with headache and  right ear pain x 1 week.  Has been taking zyrtec  and dayquil without relief.

## 2023-07-24 NOTE — ED Provider Notes (Signed)
 [facility]    CSN: [id number] Arrival date & time: 07/24/23  0925      History   Chief Complaint Chief Complaint  Patient presents with   Nasal Congestion    What started as allergies I believe has turned into a sinus infection. - Entered by patient    HPI Stefanie [name] is a 38 y.o. female.   The history is provided by the patient.   Patient presents for complaints of nasal congestion, headache, facial pressure, and right ear pain that is been present for more than 1 week.  Patient reports that she remembers a low-grade temperature around 99 when her symptoms initially started.  Denies ear drainage, runny nose, cough, chest pain, abdominal pain, nausea, vomiting, diarrhea, or rash.  Patient reports that she does have underlying history of seasonal allergies.  States that she has been taking Zyrtec  daily, states that she uses Flonase  and DayQuil with minimal relief.  States that she did take a home COVID test which was negative.  Past Medical History:  Diagnosis Date   Abnormal Pap smear of cervix    Discoid lupus 10/15/2018   Being followed by [facility] dermatology April 2020 Was seen by [facility] rheumatology they did not feel the patient has systemic lupus   Herpes 10/12/2013   HSV-2 (herpes simplex virus 2) infection    Hx of gonorrhea    Pregnant 06/23/2015    Patient Active Problem List   Diagnosis Date Noted   Moody 04/29/2023   Sinusitis 01/30/2023   UTI (urinary tract infection) 01/30/2023   Seasonal and perennial allergic rhinitis 11/29/2022   Gastroesophageal reflux disease 11/29/2022   Solar urticaria 11/29/2022   Encounter for surveillance of contraceptive pills 11/27/2021   PMS (premenstrual syndrome) 08/27/2021   Discoid lupus 10/15/2018   Nephrolithiasis 12/31/2014   Herpes 10/12/2013    Past Surgical History:  Procedure Laterality Date   CHOLECYSTECTOMY N/A 01/02/2015   Procedure: LAPAROSCOPIC CHOLECYSTECTOMY;  Surgeon: Oneil Budge, MD;   Location: [facility];  Service: General;  Laterality: N/A;   COLPOSCOPY     colpo with biopsy   WISDOM TOOTH EXTRACTION      OB History     Gravida  4   Para  3   Term  3   Preterm      AB  1   Living  3      SAB  1   IAB      Ectopic      Multiple  0   Live Births  3            Home Medications    Prior to Admission medications   Medication Sig Start Date End Date Taking? Authorizing Provider  acyclovir  (ZOVIRAX ) 400 MG tablet Take 1 tablet (400 mg total) by mouth 2 (two) times daily. 10/10/22   Griffin, Jennifer A, NP  Cetirizine  HCl (ZYRTEC  PO) Take by mouth.    [provider]  escitalopram  (LEXAPRO ) 10 MG tablet Take 1 tablet (10 mg total) by mouth daily. 06/24/23 06/23/24  Signa Delon LABOR, NP  famotidine  (PEPCID ) 20 MG tablet Take 1 tablet (20 mg total) by mouth 2 (two) times daily. For two months, then twice daily as needed. 02/07/21   Ezzard Sonny RAMAN, PA-C  fluticasone  (FLONASE ) 50 MCG/ACT nasal spray Place 2 sprays into both nostrils daily. 01/19/23   Leath-Warren, Etta PARAS, NP  norethindrone  (MICRONOR ) 0.35 MG tablet Take 1 tablet (0.35 mg total) by mouth daily. 06/24/23  Signa Nest A, NP  sodium fluoride (FLUORISHIELD) 1.1 % GEL dental gel Place 1 Application onto teeth at bedtime. 10/14/22   [provider]  triamcinolone  cream (KENALOG ) 0.1 % Apply 1 Application topically 2 (two) times daily as needed. 09/25/22   Iva Marty Saltness, MD    Family History Family History  Problem Relation Age of Onset   Heart attack Father    Heart disease Father    Dementia Maternal Grandmother    Cancer Maternal Grandfather        throat, lung, prostate cancer   Diabetes Paternal Grandmother    Heart attack Paternal Grandfather    Heart disease Paternal Grandfather    Diabetes Maternal Uncle    Colon cancer Neg Hx     Social History Social History   Tobacco Use   Smoking status: Every Day    Current packs/day: 0.25    Average  packs/day: 0.3 packs/day for 20.0 years (5.0 ttl pk-yrs)    Types: Cigarettes   Smokeless tobacco: Never  Vaping Use   Vaping status: Never Used  Substance Use Topics   Alcohol use: No   Drug use: No     Allergies   Patient has no known allergies.   Review of Systems Review of Systems Per HPI  Physical Exam Triage Vital Signs ED Triage Vitals  Encounter Vitals Group     BP 07/24/23 0940 122/86     Systolic BP Percentile --      Diastolic BP Percentile --      Pulse Rate 07/24/23 0940 94     Resp 07/24/23 0940 18     Temp 07/24/23 0940 98.7 F (37.1 C)     Temp Source 07/24/23 0940 Oral     SpO2 07/24/23 0940 95 %     Weight --      Height --      Head Circumference --      Peak Flow --      Pain Score 07/24/23 0942 5     Pain Loc --      Pain Education --      Exclude from Growth Chart --    No data found.  Updated Vital Signs BP 122/86 (BP Location: Right Arm)   Pulse 94   Temp 98.7 F (37.1 C) (Oral)   Resp 18   LMP 07/02/2023 (Exact Date)   SpO2 95%   Visual Acuity Right Eye Distance:   Left Eye Distance:   Bilateral Distance:    Right Eye Near:   Left Eye Near:    Bilateral Near:     Physical Exam Vitals and nursing note reviewed.  Constitutional:      General: She is not in acute distress.    Appearance: Normal appearance.  HENT:     Head: Normocephalic.     Right Ear: Tympanic membrane, ear canal and external ear normal.     Left Ear: Tympanic membrane, ear canal and external ear normal.     Nose: Congestion present.     Right Turbinates: Enlarged and swollen.     Left Turbinates: Enlarged and swollen.     Right Sinus: Maxillary sinus tenderness and frontal sinus tenderness present.     Left Sinus: No maxillary sinus tenderness or frontal sinus tenderness.     Mouth/Throat:     Lips: Pink.     Mouth: Mucous membranes are moist.     Pharynx: Uvula midline. Postnasal drip present. No pharyngeal swelling, oropharyngeal exudate,  posterior oropharyngeal erythema or uvula swelling.  Eyes:     Extraocular Movements: Extraocular movements intact.     Conjunctiva/sclera: Conjunctivae normal.     Pupils: Pupils are equal, round, and reactive to light.  Cardiovascular:     Rate and Rhythm: Normal rate and regular rhythm.     Pulses: Normal pulses.     Heart sounds: Normal heart sounds.  Pulmonary:     Effort: Pulmonary effort is normal. No respiratory distress.     Breath sounds: Normal breath sounds. No stridor. No wheezing, rhonchi or rales.  Abdominal:     General: Bowel sounds are normal.     Palpations: Abdomen is soft.     Tenderness: There is no abdominal tenderness.  Musculoskeletal:     Cervical back: Normal range of motion.  Lymphadenopathy:     Cervical: No cervical adenopathy.  Skin:    General: Skin is warm and dry.  Neurological:     General: No focal deficit present.     Mental Status: She is alert and oriented to person, place, and time.  Psychiatric:        Mood and Affect: Mood normal.        Behavior: Behavior normal.      UC Treatments / Results  Labs (all labs ordered are listed, but only abnormal results are displayed) Labs Reviewed - No data to display  EKG   Radiology No results found.  Procedures Procedures (including critical care time)  Medications Ordered in UC Medications - No data to display  Initial Impression / Assessment and Plan / UC Course  I have reviewed the triage vital signs and the nursing notes.  Pertinent labs & imaging results that were available during my care of the patient were reviewed by me and considered in my medical decision making (see chart for details).  On exam, lung sounds are clear throughout, room air sats at 95%.  Patient with right maxillary and frontal sinus tenderness, she also has been dealing with symptoms for more than 1 week.  Will treat for acute pansinusitis empirically with Augmentin  875/125 mg twice daily for the next 7 days.   Patient was provided Diflucan  150 mg for yeast infection with antibiotic.  Supportive care recommendations were provided and discussed with the patient to include fluids, rest, continuing her current allergy  regimen, normal saline nasal spray, and over-the-counter analgesics.  Discussed indications regarding follow-up.  Patient was in agreement with this plan of care and verbalizes understanding.  All questions were answered.  Patient stable for discharge.   Final Clinical Impressions(s) / UC Diagnoses   Final diagnoses:  None   Discharge Instructions   None    ED Prescriptions   None    PDMP not reviewed this encounter.   Gilmer Etta PARAS, NP 07/24/23 1009

## 2023-07-24 NOTE — Discharge Instructions (Addendum)
 Take medication as directed. Continue your current allergy  medication regimen. Increase fluids and get plenty of rest. May take over-the-counter Ibuprofen  or Tylenol  as needed for pain, fever, or general discomfort. Recommend normal saline nasal spray to help with nasal congestion throughout the day. For your cough, it may be helpful to use a humidifier at bedtime during sleep. If symptoms do not improve with this treatment, you may follow-up in this clinic or with your PCP for further evaluation. Follow-up as needed.

## 2023-09-01 ENCOUNTER — Ambulatory Visit
Admission: RE | Admit: 2023-09-01 | Discharge: 2023-09-01 | Disposition: A | Discharge status: Home / Self Care | Source: Ambulatory Visit | Attending: Family Medicine | Admitting: Family Medicine

## 2023-09-01 ENCOUNTER — Ambulatory Visit (INDEPENDENT_AMBULATORY_CARE_PROVIDER_SITE_OTHER)

## 2023-09-01 VITALS — BP 119/74 | HR 80 | Temp 97.8°F | Resp 18

## 2023-09-01 DIAGNOSIS — W19XXXA Unspecified fall, initial encounter: Secondary | ICD-10-CM

## 2023-09-01 DIAGNOSIS — M79671 Pain in right foot: Secondary | ICD-10-CM | POA: Diagnosis not present

## 2023-09-01 NOTE — ED Triage Notes (Signed)
 Pt reports fall that occurred on Friday injuring the right ankle.

## 2023-09-01 NOTE — ED Provider Notes (Signed)
 [facility]    CSN: [id number] Arrival date & time: 09/01/23  1053      History   Chief Complaint Chief Complaint  Patient presents with   Foot Injury    Entered by patient    HPI Stefanie [name] is a 38 y.o. female.   Patient presenting today with 3-day history of right lateral foot and ankle pain after falling and rolling the ankle inward.  Has some bruising and swelling to the area, still able to walk but significantly painful to do so.  Denies numbness, tingling, loss of range of motion, skin injury.  Did not hit her head or lose consciousness during the fall.  Taking over-the-counter pain relievers with mild temperate benefit.    Past Medical History:  Diagnosis Date   Abnormal Pap smear of cervix    Discoid lupus 10/15/2018   Being followed by [facility] dermatology April 2020 Was seen by [facility] rheumatology they did not feel the patient has systemic lupus   Herpes 10/12/2013   HSV-2 (herpes simplex virus 2) infection    Hx of gonorrhea    Pregnant 06/23/2015    Patient Active Problem List   Diagnosis Date Noted   Moody 04/29/2023   Sinusitis 01/30/2023   UTI (urinary tract infection) 01/30/2023   Seasonal and perennial allergic rhinitis 11/29/2022   Gastroesophageal reflux disease 11/29/2022   Solar urticaria 11/29/2022   Encounter for surveillance of contraceptive pills 11/27/2021   PMS (premenstrual syndrome) 08/27/2021   Discoid lupus 10/15/2018   Nephrolithiasis 12/31/2014   Herpes 10/12/2013    Past Surgical History:  Procedure Laterality Date   CHOLECYSTECTOMY N/A 01/02/2015   Procedure: LAPAROSCOPIC CHOLECYSTECTOMY;  Surgeon: Franky Macho, MD;  Location: [facility];  Service: General;  Laterality: N/A;   COLPOSCOPY     colpo with biopsy   WISDOM TOOTH EXTRACTION      OB History     Gravida  4   Para  3   Term  3   Preterm      AB  1   Living  3      SAB  1   IAB      Ectopic      Multiple  0   Live Births  3             Home Medications    Prior to Admission medications   Medication Sig Start Date End Date Taking? Authorizing Provider  acyclovir (ZOVIRAX) 400 MG tablet Take 1 tablet (400 mg total) by mouth 2 (two) times daily. 10/10/22   Adline Potter, NP  amoxicillin-clavulanate (AUGMENTIN) 875-125 MG tablet Take 1 tablet by mouth every 12 (twelve) hours. 07/24/23   Leath-Warren, Sadie Haber, NP  Cetirizine HCl (ZYRTEC PO) Take by mouth.    [provider]  escitalopram (LEXAPRO) 10 MG tablet Take 1 tablet (10 mg total) by mouth daily. 06/24/23 06/23/24  Adline Potter, NP  famotidine (PEPCID) 20 MG tablet Take 1 tablet (20 mg total) by mouth 2 (two) times daily. For two months, then twice daily as needed. 02/07/21   Tiffany Kocher, PA-C  fluticasone (FLONASE) 50 MCG/ACT nasal spray Place 2 sprays into both nostrils daily. 01/19/23   Leath-Warren, Sadie Haber, NP  norethindrone (MICRONOR) 0.35 MG tablet Take 1 tablet (0.35 mg total) by mouth daily. 06/24/23   Adline Potter, NP  sodium fluoride (FLUORISHIELD) 1.1 % GEL dental gel Place 1 Application onto teeth at bedtime. 10/14/22   [provider]  triamcinolone cream (KENALOG) 0.1 % Apply 1 Application topically 2 (two) times daily as needed. 09/25/22   Alfonse Spruce, MD    Family History Family History  Problem Relation Age of Onset   Heart attack Father    Heart disease Father    Dementia Maternal Grandmother    Cancer Maternal Grandfather        throat, lung, prostate cancer   Diabetes Paternal Grandmother    Heart attack Paternal Grandfather    Heart disease Paternal Grandfather    Diabetes Maternal Uncle    Colon cancer Neg Hx     Social History Social History   Tobacco Use   Smoking status: Every Day    Current packs/day: 0.25    Average packs/day: 0.3 packs/day for 20.0 years (5.0 ttl pk-yrs)    Types: Cigarettes   Smokeless tobacco: Never  Vaping Use   Vaping status: Never Used  Substance  Use Topics   Alcohol use: No   Drug use: No     Allergies   Patient has no known allergies.   Review of Systems Review of Systems Per HPI  Physical Exam Triage Vital Signs ED Triage Vitals  Encounter Vitals Group     BP 09/01/23 1115 119/74     Systolic BP Percentile --      Diastolic BP Percentile --      Pulse Rate 09/01/23 1115 80     Resp 09/01/23 1115 18     Temp 09/01/23 1115 97.8 F (36.6 C)     Temp Source 09/01/23 1115 Oral     SpO2 09/01/23 1115 96 %     Weight --      Height --      Head Circumference --      Peak Flow --      Pain Score 09/01/23 1117 6     Pain Loc --      Pain Education --      Exclude from Growth Chart --    No data found.  Updated Vital Signs BP 119/74 (BP Location: Right Arm)   Pulse 80   Temp 97.8 F (36.6 C) (Oral)   Resp 18   SpO2 96%   Visual Acuity Right Eye Distance:   Left Eye Distance:   Bilateral Distance:    Right Eye Near:   Left Eye Near:    Bilateral Near:     Physical Exam Vitals and nursing note reviewed.  Constitutional:      Appearance: Normal appearance. She is not ill-appearing.  HENT:     Head: Atraumatic.  Eyes:     Extraocular Movements: Extraocular movements intact.     Conjunctiva/sclera: Conjunctivae normal.  Cardiovascular:     Rate and Rhythm: Normal rate and regular rhythm.     Heart sounds: Normal heart sounds.  Pulmonary:     Effort: Pulmonary effort is normal.     Breath sounds: Normal breath sounds.  Musculoskeletal:        General: Swelling, tenderness and signs of injury present. No deformity. Normal range of motion.     Cervical back: Normal range of motion and neck supple.     Comments: Localized bruising and swelling to the right lateral foot into the lower part of lateral ankle.  Tender to palpation in this area.  Range of motion intact no bony deformity palpable  Skin:    General: Skin is warm and dry.     Findings: Bruising present. No  erythema.  Neurological:      Mental Status: She is alert and oriented to person, place, and time.     Comments: Right lower extremity neurovascularly intact  Psychiatric:        Mood and Affect: Mood normal.        Thought Content: Thought content normal.        Judgment: Judgment normal.      UC Treatments / Results  Labs (all labs ordered are listed, but only abnormal results are displayed) Labs Reviewed - No data to display  EKG   Radiology No results found.  Procedures Procedures (including critical care time)  Medications Ordered in UC Medications - No data to display  Initial Impression / Assessment and Plan / UC Course  I have reviewed the triage vital signs and the nursing notes.  Pertinent labs & imaging results that were available during my care of the patient were reviewed by me and considered in my medical decision making (see chart for details).     *** Final Clinical Impressions(s) / UC Diagnoses   Final diagnoses:  Right foot pain  Fall, initial encounter     Discharge Instructions      We will give you a call if anything comes back abnormal on your x-ray.  Rest, ice, compression, elevation, over-the-counter pain relievers as needed.   ED Prescriptions   None    PDMP not reviewed this encounter.

## 2023-09-01 NOTE — Discharge Instructions (Signed)
 We will give you a call if anything comes back abnormal on your x-ray.  Rest, ice, compression, elevation, over-the-counter pain relievers as needed.

## 2023-12-29 ENCOUNTER — Other Ambulatory Visit (HOSPITAL_COMMUNITY): Payer: Self-pay | Admitting: Family Medicine

## 2023-12-29 DIAGNOSIS — Z1231 Encounter for screening mammogram for malignant neoplasm of breast: Secondary | ICD-10-CM

## 2023-12-30 ENCOUNTER — Other Ambulatory Visit: Payer: Self-pay | Admitting: Adult Health

## 2023-12-30 DIAGNOSIS — Z01419 Encounter for gynecological examination (general) (routine) without abnormal findings: Secondary | ICD-10-CM

## 2023-12-30 DIAGNOSIS — Z13220 Encounter for screening for lipoid disorders: Secondary | ICD-10-CM

## 2023-12-30 DIAGNOSIS — Z1329 Encounter for screening for other suspected endocrine disorder: Secondary | ICD-10-CM

## 2023-12-30 DIAGNOSIS — Z131 Encounter for screening for diabetes mellitus: Secondary | ICD-10-CM

## 2023-12-30 MED ORDER — ACYCLOVIR 400 MG PO TABS: 400.0000 mg | ORAL_TABLET | Freq: Two times a day (BID) | ORAL | 11 refills | Status: AC

## 2023-12-30 NOTE — Progress Notes (Signed)
 Check CBC,CMP,TSH and lipids A1c and refill acyclovir 

## 2024-01-05 ENCOUNTER — Ambulatory Visit (HOSPITAL_COMMUNITY)

## 2024-01-14 ENCOUNTER — Ambulatory Visit (HOSPITAL_COMMUNITY)
Admission: RE | Admit: 2024-01-14 | Discharge: 2024-01-14 | Disposition: A | Discharge status: Home / Self Care | Source: Ambulatory Visit | Attending: Family Medicine | Admitting: Family Medicine

## 2024-01-14 DIAGNOSIS — Z1231 Encounter for screening mammogram for malignant neoplasm of breast: Secondary | ICD-10-CM | POA: Insufficient documentation

## 2024-01-17 ENCOUNTER — Ambulatory Visit: Payer: Self-pay | Admitting: Family Medicine

## 2024-01-19 ENCOUNTER — Other Ambulatory Visit (HOSPITAL_COMMUNITY): Payer: Self-pay | Admitting: Family Medicine

## 2024-01-19 DIAGNOSIS — R928 Other abnormal and inconclusive findings on diagnostic imaging of breast: Secondary | ICD-10-CM

## 2024-01-20 ENCOUNTER — Ambulatory Visit (HOSPITAL_COMMUNITY)
Admission: RE | Admit: 2024-01-20 | Discharge: 2024-01-20 | Disposition: A | Discharge status: Home / Self Care | Source: Ambulatory Visit | Attending: Family Medicine | Admitting: Family Medicine

## 2024-01-20 DIAGNOSIS — R928 Other abnormal and inconclusive findings on diagnostic imaging of breast: Secondary | ICD-10-CM | POA: Insufficient documentation

## 2024-01-23 ENCOUNTER — Other Ambulatory Visit: Payer: Self-pay | Admitting: Adult Health

## 2024-01-23 DIAGNOSIS — R5383 Other fatigue: Secondary | ICD-10-CM

## 2024-01-23 NOTE — Progress Notes (Signed)
 Ck B12, vitamin D and iron panel with ferritin at her request has fatigue

## 2024-02-05 ENCOUNTER — Ambulatory Visit: Payer: Self-pay | Admitting: Adult Health

## 2024-02-05 DIAGNOSIS — E538 Deficiency of other specified B group vitamins: Secondary | ICD-10-CM | POA: Insufficient documentation

## 2024-02-05 LAB — COMPREHENSIVE METABOLIC PANEL WITH GFR
ALT: 10 IU/L (ref 0–32)
AST: 11 IU/L (ref 0–40)
Albumin: 3.9 g/dL (ref 3.9–4.9)
Alkaline Phosphatase: 89 IU/L (ref 44–121)
BUN/Creatinine Ratio: 15 (ref 9–23)
BUN: 12 mg/dL (ref 6–24)
Bilirubin Total: 0.4 mg/dL (ref 0.0–1.2)
CO2: 20 mmol/L (ref 20–29)
Calcium: 8.4 mg/dL — ABNORMAL LOW (ref 8.7–10.2)
Chloride: 105 mmol/L (ref 96–106)
Creatinine, Ser: 0.81 mg/dL (ref 0.57–1.00)
Globulin, Total: 2.6 g/dL (ref 1.5–4.5)
Glucose: 86 mg/dL (ref 70–99)
Potassium: 4.1 mmol/L (ref 3.5–5.2)
Sodium: 140 mmol/L (ref 134–144)
Total Protein: 6.5 g/dL (ref 6.0–8.5)
eGFR: 94 mL/min/1.73 (ref >59)

## 2024-02-05 LAB — CBC
Hematocrit: 41.9 % (ref 34.0–46.6)
Hemoglobin: 13.7 g/dL (ref 11.1–15.9)
MCH: 33.3 pg — ABNORMAL HIGH (ref 26.6–33.0)
MCHC: 32.7 g/dL (ref 31.5–35.7)
MCV: 102 fL — ABNORMAL HIGH (ref 79–97)
Platelets: 238 x10E3/uL (ref 150–450)
RBC: 4.11 x10E6/uL (ref 3.77–5.28)
RDW: 12 % (ref 11.7–15.4)
WBC: 5.1 x10E3/uL (ref 3.4–10.8)

## 2024-02-05 LAB — LIPID PANEL
Chol/HDL Ratio: 5.2 ratio — ABNORMAL HIGH (ref 0.0–4.4)
Cholesterol, Total: 155 mg/dL (ref 100–199)
HDL: 30 mg/dL — ABNORMAL LOW (ref >39)
LDL Chol Calc (NIH): 107 mg/dL — ABNORMAL HIGH (ref 0–99)
Triglycerides: 97 mg/dL (ref 0–149)
VLDL Cholesterol Cal: 18 mg/dL (ref 5–40)

## 2024-02-05 LAB — HEMOGLOBIN A1C
Est. average glucose Bld gHb Est-mCnc: 97 mg/dL
Hgb A1c MFr Bld: 5 % (ref 4.8–5.6)

## 2024-02-05 LAB — IRON,TIBC AND FERRITIN PANEL
Ferritin: 26 ng/mL (ref 15–150)
Iron Saturation: 20 % (ref 15–55)
Iron: 56 ug/dL (ref 27–159)
Total Iron Binding Capacity: 287 ug/dL (ref 250–450)
UIBC: 231 ug/dL (ref 131–425)

## 2024-02-05 LAB — VITAMIN B12: Vitamin B-12: 132 pg/mL — ABNORMAL LOW (ref 232–1245)

## 2024-02-05 LAB — TSH: TSH: 2.01 u[IU]/mL (ref 0.450–4.500)

## 2024-02-05 LAB — VITAMIN D 25 HYDROXY (VIT D DEFICIENCY, FRACTURES): Vit D, 25-Hydroxy: 32.2 ng/mL (ref 30.0–100.0)

## 2024-02-12 ENCOUNTER — Other Ambulatory Visit (HOSPITAL_COMMUNITY)
Admission: RE | Admit: 2024-02-12 | Discharge: 2024-02-12 | Disposition: A | Discharge status: Home / Self Care | Source: Ambulatory Visit | Attending: Adult Health | Admitting: Adult Health

## 2024-02-12 ENCOUNTER — Encounter: Payer: Self-pay | Admitting: Adult Health

## 2024-02-12 ENCOUNTER — Ambulatory Visit: Admitting: Adult Health

## 2024-02-12 VITALS — BP 108/77 | HR 83 | Ht 69.0 in | Wt 220.0 lb

## 2024-02-12 DIAGNOSIS — Z1211 Encounter for screening for malignant neoplasm of colon: Secondary | ICD-10-CM | POA: Diagnosis not present

## 2024-02-12 DIAGNOSIS — N943 Premenstrual tension syndrome: Secondary | ICD-10-CM

## 2024-02-12 DIAGNOSIS — Z01419 Encounter for gynecological examination (general) (routine) without abnormal findings: Secondary | ICD-10-CM | POA: Diagnosis present

## 2024-02-12 DIAGNOSIS — Z3041 Encounter for surveillance of contraceptive pills: Secondary | ICD-10-CM

## 2024-02-12 LAB — HEMOCCULT GUIAC POC 1CARD (OFFICE): Fecal Occult Blood, POC: NEGATIVE

## 2024-02-12 MED ORDER — ESCITALOPRAM OXALATE 10 MG PO TABS: 10.0000 mg | ORAL_TABLET | Freq: Every day | ORAL | 3 refills | Status: AC

## 2024-02-12 NOTE — Progress Notes (Signed)
 Patient ID: Stefanie [name], female   DOB: 1986-02-24, 38 y.o.   MRN: [id number] History of Present Illness: Stefanie [name] is a 38 year old white female, married, [id number] in for a well woman gyn exam and pap, her request. Her PMS is good now on lexapro .  PCP is Dr Bluford   Current Medications, Allergies, Past Medical History, Past Surgical History, Family History and Social History were reviewed in [employer] electronic medical record.     Review of Systems: Patient denies any headaches, hearing loss, fatigue, blurred vision, shortness of breath, chest pain, abdominal pain, problems with bowel movements, urination, or intercourse. No joint pain or mood swings.     Physical Exam:BP 108/77 (BP Location: Left Arm, Patient Position: Sitting, Cuff Size: Normal)   Pulse 83   Ht 5' 9 (1.753 m)   Wt 220 lb (99.8 kg)   LMP 02/01/2024 (Exact Date)   BMI 32.49 kg/m   General:  Well developed, well nourished, no acute distress Skin:  Warm and dry Neck:  Midline trachea, normal thyroid, good ROM, no lymphadenopathy Lungs; Clear to auscultation bilaterally Breast:  No dominant palpable mass, retraction, or nipple discharge Cardiovascular: Regular rate and rhythm Abdomen:  Soft, non tender, no hepatosplenomegaly Pelvic:  External genitalia is normal in appearance, no lesions.  The vagina is normal in appearance. Urethra has no lesions or masses. The cervix is bulbous.Pap with HR HPV genotyping performed.  Uterus is felt to be normal size, shape, and contour.  No adnexal masses or tenderness noted.Bladder is non tender, no masses felt. Rectal: Good sphincter tone, no polyps, or hemorrhoids felt.  Hemoccult negative. Extremities/musculoskeletal:  No swelling or varicosities noted, no clubbing or cyanosis Psych:  No mood changes, alert and cooperative,seems happy AA is 1 Fall risk is low    02/12/2024    9:59 AM 02/12/2024    9:43 AM 06/24/2023    4:04 PM  Depression screen PHQ 2/9  Decreased Interest  1 1 0  Down, Depressed, Hopeless 0 0 0  PHQ - 2 Score 1 1 0  Altered sleeping 1 1 0  Tired, decreased energy 2 2 1   Change in appetite 1 1 1   Feeling bad or failure about yourself  0 0 0  Trouble concentrating 0 0 1  Moving slowly or fidgety/restless 0 0 0  Suicidal thoughts 0 0 0  PHQ-9 Score 5 5 3        02/12/2024    9:59 AM 02/12/2024    9:44 AM 06/24/2023    4:06 PM 04/11/2022    2:27 PM  GAD 7 : Generalized Anxiety Score  Nervous, Anxious, on Edge 1 1 1 1   Control/stop worrying 1 1 1  0  Worry too much - different things 2 2 1 1   Trouble relaxing 1 1 1 1   Restless 0 0 0 0  Easily annoyed or irritable 1 1 1 1   Afraid - awful might happen 1 1 0 0  Total GAD 7 Score 7 7 5 4   Anxiety Difficulty    Not difficult at all    Upstream - 02/12/24 0955       Pregnancy Intention Screening   Does the patient want to become pregnant in the next year? No    Does the patient's partner want to become pregnant in the next year? No    Would the patient like to discuss contraceptive options today? No      Contraception Wrap Up   Current Method Oral Contraceptive  End Method Oral Contraceptive    Contraception Counseling Provided Yes         Examination chaperoned by Clarita Salt LPN     Impression and plan: 1. Encounter for gynecological examination with Papanicolaou smear of cervix (Primary) Pap sent Pap in 3 years Physical in 1 year Mammogram was 01/14/24 and had follow up 01/20/24 has area of fibroglandular density, for follow up in 6 months on right  Stay active  - Cytology - PAP( [facility])  2. Encounter for screening fecal occult blood testing Hemoccult was negative  - POCT occult blood stool  3. Encounter for surveillance of contraceptive pills Happy with Micronor , has refills   4. PMS (premenstrual syndrome) Much better on lexarpo, will continue  Meds ordered this encounter  Medications   escitalopram  (LEXAPRO ) 10 MG tablet    Sig: Take 1 tablet (10 mg total)  by mouth daily.    Dispense:  90 tablet    Refill:  3    Supervising Provider:   JAYNE MINDER H [[id number]]

## 2024-02-13 LAB — CYTOLOGY - PAP
Comment: NEGATIVE
Diagnosis: NEGATIVE
High risk HPV: NEGATIVE

## 2024-02-17 ENCOUNTER — Ambulatory Visit: Payer: Self-pay | Admitting: Adult Health

## 2024-07-19 ENCOUNTER — Other Ambulatory Visit: Payer: Self-pay | Admitting: Adult Health
